# Patient Record
Sex: Female | Born: 1970 | Race: White | Hispanic: No | Marital: Single | Smoking: Never smoker
Health system: Southern US, Community
[De-identification: ages and names within clinical notes are randomized; demographics above are authoritative.]

## PROBLEM LIST (undated history)

## (undated) DIAGNOSIS — M549 Dorsalgia, unspecified: Secondary | ICD-10-CM

## (undated) DIAGNOSIS — T7840XA Allergy, unspecified, initial encounter: Secondary | ICD-10-CM

## (undated) DIAGNOSIS — I10 Essential (primary) hypertension: Secondary | ICD-10-CM

## (undated) DIAGNOSIS — E079 Disorder of thyroid, unspecified: Secondary | ICD-10-CM

## (undated) DIAGNOSIS — N809 Endometriosis, unspecified: Secondary | ICD-10-CM

## (undated) DIAGNOSIS — M2550 Pain in unspecified joint: Secondary | ICD-10-CM

## (undated) DIAGNOSIS — E669 Obesity, unspecified: Secondary | ICD-10-CM

## (undated) DIAGNOSIS — M1990 Unspecified osteoarthritis, unspecified site: Secondary | ICD-10-CM

## (undated) DIAGNOSIS — J309 Allergic rhinitis, unspecified: Secondary | ICD-10-CM

## (undated) HISTORY — PX: NASAL SINUS SURGERY: SHX719

## (undated) HISTORY — DX: Endometriosis, unspecified: N80.9

## (undated) HISTORY — DX: Disorder of thyroid, unspecified: E07.9

## (undated) HISTORY — DX: Dorsalgia, unspecified: M54.9

## (undated) HISTORY — DX: Unspecified osteoarthritis, unspecified site: M19.90

## (undated) HISTORY — DX: Obesity, unspecified: E66.9

## (undated) HISTORY — DX: Allergic rhinitis, unspecified: J30.9

## (undated) HISTORY — PX: OVARIAN CYST SURGERY: SHX726

## (undated) HISTORY — DX: Pain in unspecified joint: M25.50

## (undated) HISTORY — PX: APPENDECTOMY: SHX54

## (undated) HISTORY — DX: Essential (primary) hypertension: I10

## (undated) HISTORY — DX: Allergy, unspecified, initial encounter: T78.40XA

---

## 1992-01-15 HISTORY — PX: TONSILLECTOMY: SUR1361

## 2009-10-16 ENCOUNTER — Ambulatory Visit: Payer: Self-pay | Admitting: Obstetrics & Gynecology

## 2010-12-20 ENCOUNTER — Ambulatory Visit: Payer: Self-pay | Admitting: Obstetrics & Gynecology

## 2011-10-31 ENCOUNTER — Encounter: Payer: Self-pay | Admitting: Family Medicine

## 2011-10-31 ENCOUNTER — Ambulatory Visit (INDEPENDENT_AMBULATORY_CARE_PROVIDER_SITE_OTHER): Payer: Managed Care, Other (non HMO) | Admitting: Family Medicine

## 2011-10-31 VITALS — BP 164/110 | HR 72 | Temp 98.4°F | Ht 66.75 in | Wt 228.0 lb

## 2011-10-31 DIAGNOSIS — T7840XA Allergy, unspecified, initial encounter: Secondary | ICD-10-CM

## 2011-10-31 DIAGNOSIS — D239 Other benign neoplasm of skin, unspecified: Secondary | ICD-10-CM

## 2011-10-31 DIAGNOSIS — Z136 Encounter for screening for cardiovascular disorders: Secondary | ICD-10-CM

## 2011-10-31 DIAGNOSIS — I10 Essential (primary) hypertension: Secondary | ICD-10-CM

## 2011-10-31 DIAGNOSIS — IMO0001 Reserved for inherently not codable concepts without codable children: Secondary | ICD-10-CM

## 2011-10-31 DIAGNOSIS — D229 Melanocytic nevi, unspecified: Secondary | ICD-10-CM

## 2011-10-31 DIAGNOSIS — R252 Cramp and spasm: Secondary | ICD-10-CM

## 2011-10-31 NOTE — Progress Notes (Signed)
Subjective:    Patient ID: Ruth Elliott, female    DOB: 07-21-1970, 41 y.o.   MRN: [id number]  HPI Very pleasant 41 yo female here to establish care.  Seasonal allergies- appear well controlled on Flonase and Zyrtec. No wheezing or SOB.  Bilateral leg cramps- several weeks ago, noticed intermittent lower leg cramps, have since improved. She was exercising more at the time. Denies any new medications, she does not take diet pills or diuretics.  Endometriosis- followed by Rocco Pauls. On OCPs.  Patient Active Problem List  Diagnosis  . Allergy  . HTN, white coat   Past Medical History  Diagnosis Date  . Allergy   . Endometriosis     Followed by Rocco Pauls- on Nortrel   Past Surgical History  Procedure Date  . Appendectomy   . Tonsillectomy 1994   History  Substance Use Topics  . Smoking status: Never Smoker   . Smokeless tobacco: Not on file  . Alcohol Use: Not on file   Family History  Problem Relation Age of Onset  . COPD Mother   . Hypertension Mother   . Alzheimer's disease Father   . Hypertension Father    Allergies  Allergen Reactions  . Penicillins Shortness Of Breath   Current Outpatient Prescriptions on File Prior to Visit  Medication Sig Dispense Refill  . cetirizine (ZYRTEC) 10 MG tablet Take 10 mg by mouth daily.      . fluticasone (FLONASE) 50 MCG/ACT nasal spray Place 2 sprays into the nose daily.      . norethindrone-ethinyl estradiol 1/35 (ORTHO-NOVUM, NORTREL,CYCLAFEM) tablet Take 1 tablet by mouth daily.       The PMH, PSH, Social History, Family History, Medications, and allergies have been reviewed in [facility], and have been updated if relevant.    Review of Systems See HPI Patient reports no  vision/ hearing changes,anorexia, weight change, fever ,adenopathy, persistant / recurrent hoarseness, swallowing issues, chest pain, edema,persistant / recurrent cough, hemoptysis, dyspnea(rest, exertional, paroxysmal nocturnal),  gastrointestinal  bleeding (melena, rectal bleeding), abdominal pain, excessive heart burn, GU symptoms(dysuria, hematuria, pyuria, voiding/incontinence  Issues) syncope, focal weakness, severe memory loss, concerning skin lesions, depression, anxiety, abnormal bruising/bleeding, major joint swelling, breast masses or abnormal vaginal bleeding.       Objective:   Physical Exam Blood pressure 164/110, pulse 72, temperature 98.4 F (36.9 C), height 5' 6.75" (1.695 m), weight 228 lb (103.42 kg).  General:  Obese, Well-developed,well-nourished,in no acute distress; alert,appropriate and cooperative throughout examination Head:  normocephalic and atraumatic.   Eyes:  vision grossly intact, pupils equal, pupils round, and pupils reactive to light.   Ears:  R ear normal and L ear normal.   Nose:  no external deformity.   Breasts:  No mass, nodules, thickening, tenderness, bulging, retraction, inflamation, nipple discharge or skin changes noted.   Lungs:  Normal respiratory effort, chest expands symmetrically. Lungs are clear to auscultation, no crackles or wheezes. Heart:  Normal rate and regular rhythm. S1 and S2 normal without gallop, murmur, click, rub or other extra sounds. Abdomen:  Bowel sounds positive,abdomen soft and non-tender without masses, organomegaly or hernias noted. Msk:  No deformity or scoliosis noted of thoracic or lumbar spine.   Extremities:  No clubbing, cyanosis, edema, or deformity noted with normal full range of motion of all joints.   Neurologic:  alert & oriented X3 and gait normal.   Skin:  +AK on left forarm, several nevi Psych:  Cognition and judgment appear intact. Alert and cooperative  with normal attention span and concentration. No apparent delusions, illusions, hallucinations     Assessment & Plan:   1. Leg cramps  New- likely due to overuse- restarting activities, particularly walking on steep incline on treadmill. Will check labs to rule out other possible  factors. TSH, Magnesium, Comprehensive metabolic panel [[facility]]  2. Screening for ischemic heart disease  Lipid Panel  3. Atypical nevus  Refer to derm. Ambulatory referral to Dermatology  4. HTN, white coat  Asymptomatic- per pt, when she checks it usually 120s/80s.   5. Allergy  Stable on current meds.

## 2011-10-31 NOTE — Patient Instructions (Signed)
Great to meet you. Please stop by to see Ruth Elliott on your way out after you go to the lab. 

## 2011-11-01 LAB — COMPREHENSIVE METABOLIC PANEL
ALT: 14 IU/L (ref 0–32)
Alkaline Phosphatase: 56 IU/L (ref 42–107)
CO2: 21 mmol/L (ref 19–28)
Chloride: 105 mmol/L (ref 97–108)
GFR calc Af Amer: 126 mL/min/{1.73_m2} (ref >59)
Glucose: 83 mg/dL (ref 65–99)
Potassium: 4.4 mmol/L (ref 3.5–5.2)
Total Bilirubin: 0.3 mg/dL (ref 0.0–1.2)
Total Protein: 6.7 g/dL (ref 6.0–8.5)

## 2011-11-01 LAB — LIPID PANEL
Chol/HDL Ratio: 3.1 ratio units (ref 0.0–4.4)
HDL: 36 mg/dL — ABNORMAL LOW (ref >39)
LDL Calculated: 57 mg/dL (ref 0–99)
VLDL Cholesterol Cal: 17 mg/dL (ref 5–40)

## 2011-11-01 LAB — MAGNESIUM: Magnesium: 2 mg/dL (ref 1.6–2.6)

## 2012-02-24 ENCOUNTER — Ambulatory Visit: Payer: Self-pay

## 2012-02-29 ENCOUNTER — Other Ambulatory Visit: Payer: Self-pay

## 2012-03-18 ENCOUNTER — Encounter: Payer: Self-pay | Admitting: Family Medicine

## 2012-03-18 ENCOUNTER — Ambulatory Visit (INDEPENDENT_AMBULATORY_CARE_PROVIDER_SITE_OTHER): Payer: Managed Care, Other (non HMO) | Admitting: Family Medicine

## 2012-03-18 VITALS — BP 120/74 | HR 70 | Temp 97.6°F | Ht 66.75 in | Wt 238.8 lb

## 2012-03-18 NOTE — Progress Notes (Signed)
Nature conservation officer at [facility] [address] [place] [address] Phone: [phone] Fax: [phone]  Date:  03/18/2012   Name:  Ruth Elliott   DOB:  23-Jan-1970   MRN:  [id number] Gender: female Age: 42 y.o.  Primary Physician:  Ruthe Mannan, MD  Evaluating MD: Hannah Beat, MD   Chief Complaint: Jaw Pain   History of Present Illness:  Ruth Elliott is a 42 y.o. pleasant patient who presents with the following:  Dog caught underneath jaw and having face and ear pain. [place] Conservation officer, [employer].   Now she is having pain across her jaw and in the TMJ. No dizziness, HA, blurred vision, nausea.  Patient Active Problem List  Diagnosis  . Allergy  . HTN, white coat    Past Medical History  Diagnosis Date  . Allergy   . Endometriosis     Followed by Rocco Pauls- on Nortrel    Past Surgical History  Procedure Laterality Date  . Appendectomy    . Tonsillectomy  1994    History   Social History  . Marital Status: Single    Spouse Name: N/A    Number of Children: N/A  . Years of Education: N/A   Occupational History  . Not on file.   Social History Main Topics  . Smoking status: Never Smoker   . Smokeless tobacco: Not on file  . Alcohol Use: Not on file  . Drug Use: Not on file  . Sexually Active: Not on file   Other Topics Concern  . Not on file   Social History Narrative   Works at [employer].  Recently moved here from [place].   G0    Family History  Problem Relation Age of Onset  . COPD Mother   . Hypertension Mother   . Alzheimer's disease Father   . Hypertension Father     Allergies  Allergen Reactions  . Penicillins Shortness Of Breath    Medication list has been reviewed and updated.  Outpatient Prescriptions Prior to Visit  Medication Sig Dispense Refill  . B Complex Vitamins (B COMPLEX PO) Take by mouth.      . cetirizine (ZYRTEC) 10 MG tablet Take 10 mg by mouth daily.      . Flax Oil-Fish Oil-Borage  Oil (FISH OIL-FLAX OIL-BORAGE OIL) CAPS Take by mouth.      . fluticasone (FLONASE) 50 MCG/ACT nasal spray Place 2 sprays into the nose daily.      . Multiple Vitamin (MULTIVITAMIN) tablet Take 1 tablet by mouth daily.      . norethindrone-ethinyl estradiol 1/35 (ORTHO-NOVUM, NORTREL,CYCLAFEM) tablet Take 1 tablet by mouth daily.      . Omega 3-6-9 Fatty Acids (OMEGA 3-6-9 COMPLEX PO) Take by mouth.       No facility-administered medications prior to visit.    Review of Systems:   GEN: No acute illnesses, no fevers, chills. GI: No n/v/d, eating normally Pulm: No SOB Interactive and getting along well at home.  Otherwise, ROS is as per the HPI.   Physical Examination: BP 120/74  Pulse 70  Temp(Src) 97.6 F (36.4 C) (Oral)  Ht 5' 6.75" (1.695 m)  Wt 238 lb 12 oz (108.296 kg)  BMI 37.69 kg/m2  SpO2 98%  Ideal Body Weight: Weight in (lb) to have BMI = 25: 158.1   GEN: WDWN, NAD, Non-toxic, Alert & Oriented x 3 HEENT: Atraumatic, Normocephalic. TM clear, serous fluid, tender at TMJ Ears and Nose: No external deformity. EXTR:  No clubbing/cyanosis/edema NEURO: Normal gait.  PSYCH: Normally interactive. Conversant. Not depressed or anxious appearing.  Calm demeanor.    Assessment and Plan:  Jaw pain  Reassured, soft tissue and bone contusion with TMJ irritation likely. nsaids  Orders Today:  No orders of the defined types were placed in this encounter.    Updated Medication List: (Includes new medications, updates to list, dose adjustments) No orders of the defined types were placed in this encounter.    Medications Discontinued: There are no discontinued medications.    Signed, Elpidio Galea. Copland, MD 03/18/2012 3:42 PM

## 2012-03-19 ENCOUNTER — Ambulatory Visit: Payer: Managed Care, Other (non HMO) | Admitting: Family Medicine

## 2012-04-15 ENCOUNTER — Encounter: Payer: Self-pay | Admitting: Family Medicine

## 2012-04-15 ENCOUNTER — Ambulatory Visit (INDEPENDENT_AMBULATORY_CARE_PROVIDER_SITE_OTHER): Payer: Managed Care, Other (non HMO) | Admitting: Family Medicine

## 2012-04-15 VITALS — BP 120/72 | HR 90 | Temp 98.6°F | Ht 66.75 in | Wt 232.0 lb

## 2012-04-15 DIAGNOSIS — IMO0002 Reserved for concepts with insufficient information to code with codable children: Secondary | ICD-10-CM

## 2012-04-15 DIAGNOSIS — S76112A Strain of left quadriceps muscle, fascia and tendon, initial encounter: Secondary | ICD-10-CM

## 2012-04-15 NOTE — Progress Notes (Signed)
Nature conservation officer at [facility] [address] [place] [address] Phone: [phone] Fax: [phone]  Date:  04/15/2012   Name:  Ruth Elliott   DOB:  02-May-1970   MRN:  [id number] Gender: female Age: 42 y.o.  Primary Physician:  Ruthe Mannan, MD  Evaluating MD: Hannah Beat, MD   Chief Complaint: pulled muscle   History of Present Illness:  Ruth Elliott is a 42 y.o. pleasant patient who presents with the following:  08/2011, strained her quad, and slipped in the ice and snow and feb, and this weekend, was bending and dog pulled sideways.  Does hunter-jumper.   Left quad. She describes several instances where she injured her LEFT quadricep laterally. This initially was done in August of 2013, but she has had a couple of other instances where she has reinjured this. There's been no muscular defect and no bruising.   Patient Active Problem List  Diagnosis  . Allergy  . HTN, white coat    Past Medical History  Diagnosis Date  . Allergy   . Endometriosis     Followed by Rocco Pauls- on Nortrel    Past Surgical History  Procedure Laterality Date  . Appendectomy    . Tonsillectomy  1994    History   Social History  . Marital Status: Single    Spouse Name: N/A    Number of Children: N/A  . Years of Education: N/A   Occupational History  . Not on file.   Social History Main Topics  . Smoking status: Never Smoker   . Smokeless tobacco: Not on file  . Alcohol Use: Not on file  . Drug Use: Not on file  . Sexually Active: Not on file   Other Topics Concern  . Not on file   Social History Narrative   Works at [employer].  Recently moved here from [place].   G0    Family History  Problem Relation Age of Onset  . COPD Mother   . Hypertension Mother   . Alzheimer's disease Father   . Hypertension Father     Allergies  Allergen Reactions  . Penicillins Shortness Of Breath    Medication list has been reviewed and  updated.  Outpatient Prescriptions Prior to Visit  Medication Sig Dispense Refill  . B Complex Vitamins (B COMPLEX PO) Take by mouth.      . cetirizine (ZYRTEC) 10 MG tablet Take 10 mg by mouth daily.      . Flax Oil-Fish Oil-Borage Oil (FISH OIL-FLAX OIL-BORAGE OIL) CAPS Take by mouth.      . fluticasone (FLONASE) 50 MCG/ACT nasal spray Place 2 sprays into the nose daily.      . Multiple Vitamin (MULTIVITAMIN) tablet Take 1 tablet by mouth daily.      . norethindrone-ethinyl estradiol 1/35 (ORTHO-NOVUM, NORTREL,CYCLAFEM) tablet Take 1 tablet by mouth daily.      . Omega 3-6-9 Fatty Acids (OMEGA 3-6-9 COMPLEX PO) Take by mouth.       No facility-administered medications prior to visit.    Review of Systems:   GEN: No fevers, chills. Nontoxic. Primarily MSK c/o today. MSK: Detailed in the HPI GI: tolerating PO intake without difficulty Neuro: No numbness, parasthesias, or tingling associated. Otherwise the pertinent positives of the ROS are noted above.    Physical Examination: BP 120/72  Pulse 90  Temp(Src) 98.6 F (37 C) (Oral)  Ht 5' 6.75" (1.695 m)  Wt 232 lb (105.235 kg)  BMI 36.63 kg/m2  SpO2 98%  Ideal Body Weight: Weight in (lb) to have BMI = 25: 158.1   GEN: WDWN, NAD, Non-toxic, Alert & Oriented x 3 HEENT: Atraumatic, Normocephalic.  Ears and Nose: No external deformity. EXTR: No clubbing/cyanosis/edema NEURO: Normal gait.  PSYCH: Normally interactive. Conversant. Not depressed or anxious appearing.  Calm demeanor.   LEFT knee: Full range of motion. No effusion. Nontender on the medial and lateral joint lines. Nontender with loading of the patellar facet. Negative grind maneuver. Stable to varus and valgus stress. Anterior drawer and posterior drawer testing is negative. #Test is negative.  Nontender along vastus medialis. Vastus lateralis is tender to palpation midportion. No defect palpable. Strength testing at the knee in extension and flexion is  5/5.  Assessment and Plan: Quadriceps strain, left, initial encounter   Classic mechanism for quadriceps injury, consistent with vastus lateralis partial tear without significant muscle defect. Reassure the patient. Given rehabilitation from [facility]. Progression phase 1 through 3. Recommended body Helix quadricep Helix  Signed, Olvin Rohr T. Nikiya Starn, MD 04/15/2012 3:33 PM

## 2012-04-15 NOTE — Patient Instructions (Addendum)
BODYHELIX  Www.bodyhelix.com  Use website instuctions for measurement of limb to determine size.    

## 2012-11-16 ENCOUNTER — Encounter: Payer: Self-pay | Admitting: Family Medicine

## 2012-11-16 ENCOUNTER — Ambulatory Visit (INDEPENDENT_AMBULATORY_CARE_PROVIDER_SITE_OTHER): Payer: Managed Care, Other (non HMO) | Admitting: Family Medicine

## 2012-11-16 VITALS — BP 178/106 | HR 78 | Temp 98.6°F | Ht 66.75 in | Wt 226.0 lb

## 2012-11-16 DIAGNOSIS — S86119A Strain of other muscle(s) and tendon(s) of posterior muscle group at lower leg level, unspecified leg, initial encounter: Secondary | ICD-10-CM

## 2012-11-16 DIAGNOSIS — S838X9A Sprain of other specified parts of unspecified knee, initial encounter: Secondary | ICD-10-CM

## 2012-11-16 NOTE — Progress Notes (Signed)
Date:  11/16/2012   Name:  Ruth Elliott   DOB:  [birth year]   MRN:  [id number] Gender: female Age: 42 y.o.  Primary Physician:  Ruthe Mannan, MD   Chief Complaint: Ankle Pain   History of Present Illness:  Ruth Elliott is a 42 y.o. very pleasant female patient who presents with the following:  Left ankle: She describes as initially his left ankle pain, but she actually is not having any pain interactive her foot. She did have a prior quadricep partial thickness tear earlier in the summer, and now she is having pain mostly in her lateral gastrocnemius. She is having some pain with movement and with walking.  Lateral gastroc  Past Medical History, Surgical History, Social History, Family History, Problem List, Medications, and Allergies have been reviewed and updated if relevant.  Review of Systems:  GEN: No fevers, chills. Nontoxic. Primarily MSK c/o today. MSK: Detailed in the HPI GI: tolerating PO intake without difficulty Neuro: No numbness, parasthesias, or tingling associated. Otherwise the pertinent positives of the ROS are noted above.    Physical Examination: BP 178/106  Pulse 78  Temp(Src) 98.6 F (37 C) (Oral)  Ht 5' 6.75" (1.695 m)  Wt 226 lb (102.513 kg)  BMI 35.68 kg/m2  LMP 10/26/2012   GEN: Well-developed,well-nourished,in no acute distress; alert,appropriate and cooperative throughout examination HEENT: Normocephalic and atraumatic without obvious abnormalities. Ears, externally no deformities PULM: Breathing comfortably in no respiratory distress EXT: No clubbing, cyanosis, or edema PSYCH: Normally interactive. Cooperative during the interview. Pleasant. Friendly and conversant. Not anxious or depressed appearing. Normal, full affect.  ANKLE: B Echymosis: no Edema: no ROM: Full dorsi and plantar flexion, inversion, eversion Gait: heel toe, non-antalgic Lateral Mall: NT Medial Mall: NT Talus: NT Navicular: NT Cuboid: NT Calcaneous:  NT Metatarsals: NT 5th MT: NT Phalanges: NT Achilles: NT Plantar Fascia: NT Fat Pad: NT Peroneals: NT Post Tib: NT Great Toe: Nml motion Ant Drawer: neg Talar Tilt: neg ATFL: NT CFL: NT Deltoid: NT Str: 5/5 Other Special tests: none Sensation: intact   Lateral gastrocnemius is mildly tender to palpation and tender to palpation along the tendon in the posterior insertion.  Assessment and Plan:  Gastrocnemius muscle strain, unspecified laterality, initial encounter  Rehab through injury  Patient Instructions  Treadmill: Calf and posterior lower extremity rehab  Forward Walking: Go light 2 mins, easy about 2-3 mph: At Sideways Left: 2 mins, 0.6 - 0.8 mph Sideways Right: 2 mins, 0.6 - 0.8 mph Backwards, 2 mins, 1.8 - 2.2 mph Repeat, several cycles Goal is 30 minute  Start at a 3 degree incline - can slightly lower if necessary When able, increase to 3.5, then 4, then 4.5 (After you comfortably can do 30 minutes)    Orders Today:  No orders of the defined types were placed in this encounter.    New medications, updates to list, dose adjustments: No orders of the defined types were placed in this encounter.    Signed,  Elpidio Galea. Wenona Mayville, MD, CAQ Sports Medicine  [facility] [address] [place] [address] Phone: [phone] Fax: [phone]  Updated Complete Medication List:   Medication List       This list is accurate as of: 11/16/12  3:28 PM.  Always use your most recent med list.               B COMPLEX PO  Take by mouth.     cetirizine 10 MG tablet  Commonly known as:  ZYRTEC  Take 10 mg by mouth daily.     Fish Oil-Flax Oil-Borage Oil Caps  Take by mouth.     fluticasone 50 MCG/ACT nasal spray  Commonly known as:  FLONASE  Place 2 sprays into the nose daily.     multivitamin tablet  Take 1 tablet by mouth daily.     norethindrone-ethinyl estradiol 1/35 tablet  Commonly known as:  ORTHO-NOVUM,  NORTREL,CYCLAFEM  Take 1 tablet by mouth daily.

## 2012-11-16 NOTE — Patient Instructions (Signed)
Treadmill: Calf and posterior lower extremity rehab  Forward Walking: Go light 2 mins, easy about 2-3 mph: At Sideways Left: 2 mins, 0.6 - 0.8 mph Sideways Right: 2 mins, 0.6 - 0.8 mph Backwards, 2 mins, 1.8 - 2.2 mph Repeat, several cycles Goal is 30 minute  Start at a 3 degree incline - can slightly lower if necessary When able, increase to 3.5, then 4, then 4.5 (After you comfortably can do 30 minutes)  

## 2012-11-19 ENCOUNTER — Other Ambulatory Visit: Payer: Self-pay

## 2013-03-15 ENCOUNTER — Ambulatory Visit: Payer: Self-pay | Admitting: Obstetrics & Gynecology

## 2013-08-31 ENCOUNTER — Encounter: Payer: Self-pay | Admitting: Family Medicine

## 2013-08-31 ENCOUNTER — Ambulatory Visit (INDEPENDENT_AMBULATORY_CARE_PROVIDER_SITE_OTHER): Payer: Managed Care, Other (non HMO) | Admitting: Family Medicine

## 2013-08-31 VITALS — BP 128/78 | HR 69 | Temp 98.1°F | Ht 65.5 in | Wt 209.5 lb

## 2013-08-31 DIAGNOSIS — E66812 Obesity, class 2: Secondary | ICD-10-CM | POA: Insufficient documentation

## 2013-08-31 DIAGNOSIS — E6609 Other obesity due to excess calories: Secondary | ICD-10-CM | POA: Insufficient documentation

## 2013-08-31 DIAGNOSIS — E669 Obesity, unspecified: Secondary | ICD-10-CM | POA: Insufficient documentation

## 2013-08-31 NOTE — Addendum Note (Signed)
Addended by: Marchia Bond on: 08/31/2013 10:57 AM   Modules accepted: Orders

## 2013-08-31 NOTE — Assessment & Plan Note (Signed)
Improved but she feels she cannot lose more weight or improve lifestyle. BMI 34.32. Will check labs today to rule out thyroid dysfunction and insulin resistance. The patient indicates understanding of these issues and agrees with the plan.  She declines nutrition referral at this time.

## 2013-08-31 NOTE — Progress Notes (Signed)
Subjective:   Patient ID: Ruth Elliott, female    DOB: [birth year], 43 y.o.   MRN: [id number]  ELECTRA PALADINO is a pleasant 43 y.o. year old female who presents to clinic today with Follow-up  on 08/31/2013  HPI: I have not seen her since she established care with me in 10/2011.  Wellness program at work- has to lose 10% of her body weight.  Has been exercising intensely daily and following my fitness pal with only 20 lb weight loss. She feels she should be losing more.  No family h/o thyroid dysfunction.  She has noticed more constipation but denies any other symptoms of hypo thyroidism.  Wt Readings from Last 3 Encounters:  08/31/13 209 lb 8 oz (95.029 kg)  11/16/12 226 lb (102.513 kg)  04/15/12 232 lb (105.235 kg)   Current Outpatient Prescriptions on File Prior to Visit  Medication Sig Dispense Refill  . B Complex Vitamins (B COMPLEX PO) Take by mouth.      . cetirizine (ZYRTEC) 10 MG tablet Take 10 mg by mouth daily.      . Flax Oil-Fish Oil-Borage Oil (FISH OIL-FLAX OIL-BORAGE OIL) CAPS Take by mouth.      . fluticasone (FLONASE) 50 MCG/ACT nasal spray Place 2 sprays into the nose daily.      . Multiple Vitamin (MULTIVITAMIN) tablet Take 1 tablet by mouth daily.      . norethindrone-ethinyl estradiol 1/35 (ORTHO-NOVUM, NORTREL,CYCLAFEM) tablet Take 1 tablet by mouth daily.       No current facility-administered medications on file prior to visit.    Allergies  Allergen Reactions  . Penicillins Shortness Of Breath    Past Medical History  Diagnosis Date  . Allergy   . Endometriosis     Followed by Lynnell Chad- on Nortrel    Past Surgical History  Procedure Laterality Date  . Appendectomy    . Tonsillectomy  1994    Family History  Problem Relation Age of Onset  . COPD Mother   . Hypertension Mother   . Alzheimer's disease Father   . Hypertension Father     History   Social History  . Marital Status: Single    Spouse Name: N/A    Number  of Children: N/A  . Years of Education: N/A   Occupational History  . Not on file.   Social History Main Topics  . Smoking status: Never Smoker   . Smokeless tobacco: Never Used  . Alcohol Use: No  . Drug Use: No  . Sexual Activity: Not on file   Other Topics Concern  . Not on file   Social History Narrative   Works at [employer].  Recently moved here from [place].   G0   The PMH, PSH, Social History, Family History, Medications, and allergies have been reviewed in [facility], and have been updated if relevant.   Review of Systems See HPI Denies increased thirst or urination No cold or heat intolerance No depression or anxiety No difficulty swallowing No tremors No palpitations    Objective:    BP 128/78  Pulse 69  Temp(Src) 98.1 F (36.7 C) (Oral)  Ht 5' 5.5" (1.664 m)  Wt 209 lb 8 oz (95.029 kg)  BMI 34.32 kg/m2  SpO2 99%  LMP 07/27/2013   Physical Exam  Nursing note and vitals reviewed. Constitutional: She is oriented to person, place, and time. She appears well-developed and well-nourished. No distress.  HENT:  Head: Normocephalic and atraumatic.  No thyromegaly  Eyes: Pupils are equal, round, and reactive to light.  Cardiovascular: Normal rate and regular rhythm.   Pulmonary/Chest: Effort normal and breath sounds normal. No respiratory distress.  Abdominal: Soft. Bowel sounds are normal.  Neurological: She is alert and oriented to person, place, and time.  Skin: Skin is warm and dry.  Psychiatric: She has a normal mood and affect. Her behavior is normal. Judgment and thought content normal.          Assessment & Plan:   Obesity, unspecified - Plan: TSH, T4, Free, Hemoglobin A1c No Follow-up on file.

## 2013-08-31 NOTE — Progress Notes (Signed)
Pre visit review using our clinic review tool, if applicable. No additional management support is needed unless otherwise documented below in the visit note. 

## 2013-08-31 NOTE — Patient Instructions (Signed)
Great to see you. I will call you with your lab results.   

## 2013-09-01 LAB — HEMOGLOBIN A1C
ESTIMATED AVERAGE GLUCOSE: 108 mg/dL
Hgb A1c MFr Bld: 5.4 % (ref 4.8–5.6)

## 2013-09-01 LAB — TSH: TSH: 1.08 u[IU]/mL (ref 0.450–4.500)

## 2013-09-01 LAB — T4, FREE: Free T4: 1.28 ng/dL (ref 0.82–1.77)

## 2013-09-02 ENCOUNTER — Encounter: Payer: Self-pay | Admitting: Family Medicine

## 2013-09-06 ENCOUNTER — Telehealth: Payer: Self-pay | Admitting: Emergency Medicine

## 2013-09-06 NOTE — Telephone Encounter (Signed)
Placed in Dr Hulen Shouts inbox for review and completion

## 2013-09-06 NOTE — Telephone Encounter (Signed)
Spoke to pt and informed her form is available for pickup at the front desk

## 2013-09-06 NOTE — Telephone Encounter (Signed)
Patient dropped off Health Screening form to be filled out by MD. Left on Waynetta's desk./as

## 2014-02-22 ENCOUNTER — Ambulatory Visit (INDEPENDENT_AMBULATORY_CARE_PROVIDER_SITE_OTHER): Payer: Managed Care, Other (non HMO) | Admitting: Internal Medicine

## 2014-02-22 ENCOUNTER — Encounter: Payer: Self-pay | Admitting: Internal Medicine

## 2014-02-22 VITALS — BP 160/98 | HR 76 | Temp 98.8°F | Wt 216.0 lb

## 2014-02-22 DIAGNOSIS — T148XXA Other injury of unspecified body region, initial encounter: Secondary | ICD-10-CM

## 2014-02-22 DIAGNOSIS — T148 Other injury of unspecified body region: Secondary | ICD-10-CM

## 2014-02-22 MED ORDER — CYCLOBENZAPRINE HCL 5 MG PO TABS: 5.0000 mg | ORAL_TABLET | Freq: Three times a day (TID) | ORAL | Status: DC | PRN

## 2014-02-22 NOTE — Progress Notes (Signed)
Pre visit review using our clinic review tool, if applicable. No additional management support is needed unless otherwise documented below in the visit note. 

## 2014-02-22 NOTE — Patient Instructions (Signed)
Back Exercises These exercises may help you when beginning to rehabilitate your injury. Your symptoms may resolve with or without further involvement from your physician, physical therapist or athletic trainer. While completing these exercises, remember:   Restoring tissue flexibility helps normal motion to return to the joints. This allows healthier, less painful movement and activity.  An effective stretch should be held for at least 30 seconds.  A stretch should never be painful. You should only feel a gentle lengthening or release in the stretched tissue. STRETCH - Extension, Prone on Elbows   Lie on your stomach on the floor, a bed will be too soft. Place your palms about shoulder width apart and at the height of your head.  Place your elbows under your shoulders. If this is too painful, stack pillows under your chest.  Allow your body to relax so that your hips drop lower and make contact more completely with the floor.  Hold this position for __________ seconds.  Slowly return to lying flat on the floor. Repeat __________ times. Complete this exercise __________ times per day.  RANGE OF MOTION - Extension, Prone Press Ups   Lie on your stomach on the floor, a bed will be too soft. Place your palms about shoulder width apart and at the height of your head.  Keeping your back as relaxed as possible, slowly straighten your elbows while keeping your hips on the floor. You may adjust the placement of your hands to maximize your comfort. As you gain motion, your hands will come more underneath your shoulders.  Hold this position __________ seconds.  Slowly return to lying flat on the floor. Repeat __________ times. Complete this exercise __________ times per day.  RANGE OF MOTION- Quadruped, Neutral Spine   Assume a hands and knees position on a firm surface. Keep your hands under your shoulders and your knees under your hips. You may place padding under your knees for  comfort.  Drop your head and point your tail bone toward the ground below you. This will round out your low back like an angry cat. Hold this position for __________ seconds.  Slowly lift your head and release your tail bone so that your back sags into a large arch, like an old horse.  Hold this position for __________ seconds.  Repeat this until you feel limber in your low back.  Now, find your "sweet spot." This will be the most comfortable position somewhere between the two previous positions. This is your neutral spine. Once you have found this position, tense your stomach muscles to support your low back.  Hold this position for __________ seconds. Repeat __________ times. Complete this exercise __________ times per day.  STRETCH - Flexion, Single Knee to Chest   Lie on a firm bed or floor with both legs extended in front of you.  Keeping one leg in contact with the floor, bring your opposite knee to your chest. Hold your leg in place by either grabbing behind your thigh or at your knee.  Pull until you feel a gentle stretch in your low back. Hold __________ seconds.  Slowly release your grasp and repeat the exercise with the opposite side. Repeat __________ times. Complete this exercise __________ times per day.  STRETCH - Hamstrings, Standing  Stand or sit and extend your right / left leg, placing your foot on a chair or foot stool  Keeping a slight arch in your low back and your hips straight forward.  Lead with your chest and   lean forward at the waist until you feel a gentle stretch in the back of your right / left knee or thigh. (When done correctly, this exercise requires leaning only a small distance.)  Hold this position for __________ seconds. Repeat __________ times. Complete this stretch __________ times per day. STRENGTHENING - Deep Abdominals, Pelvic Tilt   Lie on a firm bed or floor. Keeping your legs in front of you, bend your knees so they are both pointed  toward the ceiling and your feet are flat on the floor.  Tense your lower abdominal muscles to press your low back into the floor. This motion will rotate your pelvis so that your tail bone is scooping upwards rather than pointing at your feet or into the floor.  With a gentle tension and even breathing, hold this position for __________ seconds. Repeat __________ times. Complete this exercise __________ times per day.  STRENGTHENING - Abdominals, Crunches   Lie on a firm bed or floor. Keeping your legs in front of you, bend your knees so they are both pointed toward the ceiling and your feet are flat on the floor. Cross your arms over your chest.  Slightly tip your chin down without bending your neck.  Tense your abdominals and slowly lift your trunk high enough to just clear your shoulder blades. Lifting higher can put excessive stress on the low back and does not further strengthen your abdominal muscles.  Control your return to the starting position. Repeat __________ times. Complete this exercise __________ times per day.  STRENGTHENING - Quadruped, Opposite UE/LE Lift   Assume a hands and knees position on a firm surface. Keep your hands under your shoulders and your knees under your hips. You may place padding under your knees for comfort.  Find your neutral spine and gently tense your abdominal muscles so that you can maintain this position. Your shoulders and hips should form a rectangle that is parallel with the floor and is not twisted.  Keeping your trunk steady, lift your right hand no higher than your shoulder and then your left leg no higher than your hip. Make sure you are not holding your breath. Hold this position __________ seconds.  Continuing to keep your abdominal muscles tense and your back steady, slowly return to your starting position. Repeat with the opposite arm and leg. Repeat __________ times. Complete this exercise __________ times per day. Document Released:  01/18/2005 Document Revised: 03/25/2011 Document Reviewed: 04/14/2008 Exit[employer] Patient Information 2015 [employer]. This information is not intended to replace advice given to you by your health care provider. Make sure you discuss any questions you have with your health care provider.  

## 2014-02-22 NOTE — Progress Notes (Signed)
Subjective:    Patient ID: Ruth Elliott, female    DOB: 05/12/1970, 44 y.o.   MRN: [id number]  HPI  Ruth Elliott is a 44 y.o. female presenting for c/o sudden muscle pain in the left mid-back region. She started feeling the pain yesterday morning while driving. Pain feels like a twisting sensation that won't let up. Pain is 8/10. Tried ibuprofen 600 mg, tylenol, ice and heat with minimal relief. She sits in the same position at work and thinks it might be the cause; has some relief when standing and stretching. No changes in bowel/bladder, numbness/tingling or shooting pains. No hx of injuries. Goes to chiropractor once a month; last visit 02/09/13. BP 160/98; she attributes to "white coat syndrome".  Review of Systems   Past Medical History  Diagnosis Date  . Allergy   . Endometriosis     Followed by [facility]- on Nortrel    Current Outpatient Prescriptions  Medication Sig Dispense Refill  . B Complex Vitamins (B COMPLEX PO) Take by mouth.    . cetirizine-pseudoephedrine (ZYRTEC-D) 5-120 MG per tablet Take 1 tablet by mouth as needed for allergies.    . Flax Oil-Fish Oil-Borage Oil (FISH OIL-FLAX OIL-BORAGE OIL) CAPS Take by mouth.    . fluticasone (FLONASE) 50 MCG/ACT nasal spray Place 2 sprays into the nose daily.    . Multiple Vitamin (MULTIVITAMIN) tablet Take 1 tablet by mouth daily.    . norethindrone-ethinyl estradiol 1/35 (ORTHO-NOVUM, NORTREL,CYCLAFEM) tablet Take 1 tablet by mouth daily.    . cetirizine (ZYRTEC) 10 MG tablet Take 10 mg by mouth daily.     No current facility-administered medications for this visit.    Allergies  Allergen Reactions  . Penicillins Shortness Of Breath    Family History  Problem Relation Age of Onset  . COPD Mother   . Hypertension Mother   . Alzheimer's disease Father   . Hypertension Father     History   Social History  . Marital Status: Single    Spouse Name: N/A    Number of Children: N/A  . Years of Education:  N/A   Occupational History  . Not on file.   Social History Main Topics  . Smoking status: Never Smoker   . Smokeless tobacco: Never Used  . Alcohol Use: No  . Drug Use: Yes  . Sexual Activity: Not on file   Other Topics Concern  . Not on file   Social History Narrative   Works at [employer].  Recently moved here from [place].   G0   Constitutional: Denies fever, chills or unintentional weight loss. Respiratory: Denies difficulty breathing, shortness of breath or cough. Cardiovascular: Denies chest pain, chest tightness, palpitations or swelling in the hands or feet.  Gastrointestinal: Denies abdominal pain, bloating, constipation, diarrhea or blood in the stool.  GU: Denies urgency, frequency, pain with urination, burning sensation, blood in urine, odor or discharge. Musculoskeletal: Positive back muscle pain. Denies decrease in range of motion, difficulty with gait, joint pain or swelling.  Skin: Denies redness, rashes, lesions or ulcercations.  Neurological: Denies dizziness, paresthesias or problems with balance and coordination.   No other specific complaints in a complete review of systems (except as listed in HPI above).     Objective:   Physical Exam BP 160/98 mmHg  Pulse 76  Temp(Src) 98.8 F (37.1 C) (Oral)  Wt 216 lb (97.977 kg)  SpO2 98%   BP 160/98 mmHg  Pulse 76  Temp(Src) 98.8 F (37.1 C) (  Oral)  Wt 216 lb (97.977 kg)  SpO2 98% Wt Readings from Last 3 Encounters:  02/22/14 216 lb (97.977 kg)  08/31/13 209 lb 8 oz (95.029 kg)  11/16/12 226 lb (102.513 kg)    General: Appears her stated age, well developed, well nourished in NAD. Skin: Warm, dry and intact. No rashes, lesions or ulcerations noted. Cardiovascular: Normal rate and rhythm. S1,S2 noted.  No murmur, rubs or gallops noted.  Pulmonary/Chest: Normal effort and positive vesicular breath sounds. No respiratory distress.  Musculoskeletal: Bony features of shoulders and hips are of equal height  b/l.  Spine and lower extremities equal b/l; posture is upright; gait is smooth and normal, able to walk on toes and heels w/ no difficulty. Back - spinous processes of C7-L5 palpable, midline; normal range of motion, negative straight leg test;  Moderate tenderness to palpation of left mid-back, mild tenderness to palpation of left upper hip region. No CVAT. Left hip - normal ROM, muscle strength 5/5. Neurological: Alert and oriented. Cranial nerves II-XII intact. Coordination normal.       Component Value Date/Time   NA 143 10/31/2011 1144   K 4.4 10/31/2011 1144   CL 105 10/31/2011 1144   CO2 21 10/31/2011 1144   GLUCOSE 83 10/31/2011 1144   BUN 12 10/31/2011 1144   CREATININE 0.66 10/31/2011 1144   CALCIUM 9.1 10/31/2011 1144   GFRNONAA 109 10/31/2011 1144   GFRAA 126 10/31/2011 1144    Lipid Panel     Component Value Date/Time   TRIG 86 10/31/2011 1144   HDL 36* 10/31/2011 1144   CHOLHDL 3.1 10/31/2011 1144   LDLCALC 57 10/31/2011 1144    CBC No results found for: WBC, RBC, HGB, HCT, PLT, MCV, MCH, MCHC, RDW, LYMPHSABS, MONOABS, EOSABS, BASOSABS  Hgb A1C Lab Results  Component Value Date   HGBA1C 5.4 08/31/2013         Assessment & Plan:   Muscle strain:  Flexeril 5 MG tablet PRN  Back exercises handout Continue use of ice pack and/or heating pad  ibuprofen PRN   RTC if symptoms worsen.

## 2014-07-26 ENCOUNTER — Other Ambulatory Visit: Payer: Self-pay | Admitting: Family Medicine

## 2014-07-26 DIAGNOSIS — Z01419 Encounter for gynecological examination (general) (routine) without abnormal findings: Secondary | ICD-10-CM | POA: Insufficient documentation

## 2014-08-10 ENCOUNTER — Other Ambulatory Visit (INDEPENDENT_AMBULATORY_CARE_PROVIDER_SITE_OTHER): Payer: Managed Care, Other (non HMO)

## 2014-08-10 DIAGNOSIS — Z01419 Encounter for gynecological examination (general) (routine) without abnormal findings: Secondary | ICD-10-CM

## 2014-08-10 DIAGNOSIS — Z0000 Encounter for general adult medical examination without abnormal findings: Secondary | ICD-10-CM

## 2014-08-11 LAB — COMPREHENSIVE METABOLIC PANEL
A/G RATIO: 1.6 (ref 1.1–2.5)
ALK PHOS: 49 IU/L (ref 39–117)
ALT: 15 IU/L (ref 0–32)
AST: 31 IU/L (ref 0–40)
Albumin: 4.2 g/dL (ref 3.5–5.5)
BILIRUBIN TOTAL: 0.8 mg/dL (ref 0.0–1.2)
BUN/Creatinine Ratio: 27 — ABNORMAL HIGH (ref 9–23)
BUN: 16 mg/dL (ref 6–24)
CHLORIDE: 96 mmol/L — AB (ref 97–108)
CO2: 17 mmol/L — AB (ref 18–29)
Calcium: 8.7 mg/dL (ref 8.7–10.2)
Creatinine, Ser: 0.6 mg/dL (ref 0.57–1.00)
GFR calc Af Amer: 128 mL/min/{1.73_m2} (ref >59)
GFR calc non Af Amer: 111 mL/min/{1.73_m2} (ref >59)
Globulin, Total: 2.6 g/dL (ref 1.5–4.5)
Glucose: 89 mg/dL (ref 65–99)
POTASSIUM: 4.3 mmol/L (ref 3.5–5.2)
SODIUM: 137 mmol/L (ref 134–144)
Total Protein: 6.8 g/dL (ref 6.0–8.5)

## 2014-08-11 LAB — CBC WITH DIFFERENTIAL/PLATELET
BASOS ABS: 0 10*3/uL (ref 0.0–0.2)
Basos: 0 %
EOS (ABSOLUTE): 0.1 10*3/uL (ref 0.0–0.4)
Eos: 1 %
HEMATOCRIT: 42.3 % (ref 34.0–46.6)
Hemoglobin: 14.3 g/dL (ref 11.1–15.9)
Immature Grans (Abs): 0 10*3/uL (ref 0.0–0.1)
Immature Granulocytes: 0 %
LYMPHS ABS: 3.1 10*3/uL (ref 0.7–3.1)
LYMPHS: 30 %
MCH: 32.8 pg (ref 26.6–33.0)
MCHC: 33.8 g/dL (ref 31.5–35.7)
MCV: 97 fL (ref 79–97)
MONOS ABS: 0.4 10*3/uL (ref 0.1–0.9)
Monocytes: 4 %
Neutrophils Absolute: 6.8 10*3/uL (ref 1.4–7.0)
Neutrophils: 65 %
Platelets: 175 10*3/uL (ref 150–379)
RBC: 4.36 x10E6/uL (ref 3.77–5.28)
RDW: 13.1 % (ref 12.3–15.4)
WBC: 10.5 10*3/uL (ref 3.4–10.8)

## 2014-08-11 LAB — LIPID PANEL
CHOL/HDL RATIO: 3 ratio (ref 0.0–4.4)
Cholesterol, Total: 115 mg/dL (ref 100–199)
HDL: 38 mg/dL — ABNORMAL LOW (ref >39)
LDL Calculated: 59 mg/dL (ref 0–99)
Triglycerides: 91 mg/dL (ref 0–149)
VLDL Cholesterol Cal: 18 mg/dL (ref 5–40)

## 2014-08-11 LAB — HEMOGLOBIN A1C
ESTIMATED AVERAGE GLUCOSE: 108 mg/dL
Hgb A1c MFr Bld: 5.4 % (ref 4.8–5.6)

## 2014-08-11 LAB — TSH: TSH: 0.818 u[IU]/mL (ref 0.450–4.500)

## 2014-08-15 ENCOUNTER — Encounter: Payer: Self-pay | Admitting: Family Medicine

## 2014-08-15 ENCOUNTER — Ambulatory Visit (INDEPENDENT_AMBULATORY_CARE_PROVIDER_SITE_OTHER): Payer: Managed Care, Other (non HMO) | Admitting: Family Medicine

## 2014-08-15 VITALS — BP 144/88 | HR 78 | Temp 98.6°F | Ht 65.5 in | Wt 206.2 lb

## 2014-08-15 DIAGNOSIS — M791 Myalgia, unspecified site: Secondary | ICD-10-CM | POA: Insufficient documentation

## 2014-08-15 DIAGNOSIS — IMO0001 Reserved for inherently not codable concepts without codable children: Secondary | ICD-10-CM

## 2014-08-15 DIAGNOSIS — Z0000 Encounter for general adult medical examination without abnormal findings: Secondary | ICD-10-CM

## 2014-08-15 DIAGNOSIS — E669 Obesity, unspecified: Secondary | ICD-10-CM

## 2014-08-15 DIAGNOSIS — Z01419 Encounter for gynecological examination (general) (routine) without abnormal findings: Secondary | ICD-10-CM

## 2014-08-15 DIAGNOSIS — R5383 Other fatigue: Secondary | ICD-10-CM | POA: Diagnosis not present

## 2014-08-15 DIAGNOSIS — R030 Elevated blood-pressure reading, without diagnosis of hypertension: Secondary | ICD-10-CM

## 2014-08-15 NOTE — Assessment & Plan Note (Signed)
New- likely multifactorial. Given myalgias- rule out Vit D and B12 deficiencies.  Other routine labs and exam today reassuring. The patient indicates understanding of these issues and agrees with the plan.  Orders Placed This Encounter  Procedures  . Vit D  25 hydroxy (rtn osteoporosis monitoring)  . Vitamin B12  . Nicotine/cotinine metabolites

## 2014-08-15 NOTE — Assessment & Plan Note (Signed)
Reviewed preventive care protocols, scheduled due services, and updated immunizations Discussed nutrition, exercise, diet, and healthy lifestyle.  

## 2014-08-15 NOTE — Progress Notes (Signed)
Subjective:   Patient ID: Ruth Elliott, female    DOB: May 10, 1970, 44 y.o.   MRN: [id number]  Ruth Elliott is a pleasant 44 y.o. year old female who presents to clinic today with Annual Exam; Fatigue; and muscle aches  on 08/15/2014  HPI:  G0- has Howe. Last pap smear was 03/2014 per pt.  No h/o abnormal pap smears.  Brings in health screening form from her employer, [employer], to fill out today.  3 D mammo 03/2014 as well also neg per pt.  Fatigue- with muscle cramps for past few months.  Mainly in her legs, quadriceps.  No DOE- walks 25 per day.  Also walks on the treadmill 30 minutes 5 days per week.  Sleeping ok but not feel well rested. Periods have been irregular- bleeding almost every other week.  Plans on calling her GYN today to make an appointment to discuss. No blood in her stool. Wt Readings from Last 3 Encounters:  08/15/14 206 lb 4 oz (93.554 kg)  02/22/14 216 lb (97.977 kg)  08/31/13 209 lb 8 oz (95.029 kg)     White coat HTN- per pt, at her screen at work was 127/80.  Denies HA, blurred vision, CP or SOB. Lab Results  Component Value Date   WBC 10.5 08/10/2014   HCT 42.3 08/10/2014   Lab Results  Component Value Date   TSH 0.818 08/10/2014   Lab Results  Component Value Date   CHOL 115 08/10/2014   HDL 38* 08/10/2014   LDLCALC 59 08/10/2014   TRIG 91 08/10/2014   CHOLHDL 3.0 08/10/2014   Lab Results  Component Value Date   CREATININE 0.60 08/10/2014   Lab Results  Component Value Date   ALT 15 08/10/2014   AST 31 08/10/2014   ALKPHOS 49 08/10/2014   BILITOT 0.8 08/10/2014   Current Outpatient Prescriptions on File Prior to Visit  Medication Sig Dispense Refill  . B Complex Vitamins (B COMPLEX PO) Take by mouth.    . cetirizine (ZYRTEC) 10 MG tablet Take 10 mg by mouth daily.    . cetirizine-pseudoephedrine (ZYRTEC-D) 5-120 MG per tablet Take 1 tablet by mouth as needed for allergies.    . Flax Oil-Fish Oil-Borage Oil (FISH  OIL-FLAX OIL-BORAGE OIL) CAPS Take by mouth.    . Multiple Vitamin (MULTIVITAMIN) tablet Take 1 tablet by mouth daily.    . norethindrone-ethinyl estradiol 1/35 (ORTHO-NOVUM, NORTREL,CYCLAFEM) tablet Take 1 tablet by mouth daily.     No current facility-administered medications on file prior to visit.    Allergies  Allergen Reactions  . Penicillins Shortness Of Breath    Past Medical History  Diagnosis Date  . Allergy   . Endometriosis     Followed by Lynnell Chad- on Nortrel    Past Surgical History  Procedure Laterality Date  . Appendectomy    . Tonsillectomy  1994    Family History  Problem Relation Age of Onset  . COPD Mother   . Hypertension Mother   . Alzheimer's disease Father   . Hypertension Father     History   Social History  . Marital Status: Single    Spouse Name: N/A  . Number of Children: N/A  . Years of Education: N/A   Occupational History  . Not on file.   Social History Main Topics  . Smoking status: Never Smoker   . Smokeless tobacco: Never Used  . Alcohol Use: No  . Drug Use: Yes  .  Sexual Activity: Not on file   Other Topics Concern  . Not on file   Social History Narrative   Works at [employer].  Recently moved here from [place].   G0   The PMH, PSH, Social History, Family History, Medications, and allergies have been reviewed in [facility], and have been updated if relevant.   Review of Systems  Constitutional: Positive for fatigue. Negative for fever and unexpected weight change.  HENT: Negative.   Eyes: Negative.   Respiratory: Negative.   Cardiovascular: Negative.   Gastrointestinal: Negative.   Endocrine: Negative.   Genitourinary: Negative.   Musculoskeletal: Negative.   Skin: Negative.   Allergic/Immunologic: Negative.   Neurological: Negative.   Hematological: Negative.   Psychiatric/Behavioral: Negative.   All other systems reviewed and are negative.      Objective:    BP 144/88 mmHg  Pulse 78  Temp(Src)  98.6 F (37 C) (Oral)  Ht 5' 5.5" (1.664 m)  Wt 206 lb 4 oz (93.554 kg)  BMI 33.79 kg/m2  SpO2 98%  LMP 08/12/2014  BP Readings from Last 3 Encounters:  08/15/14 144/88  02/22/14 160/98  08/31/13 128/78    Physical Exam    General:  Well-developed,well-nourished,in no acute distress; alert,appropriate and cooperative throughout examination Head:  normocephalic and atraumatic.   Eyes:  vision grossly intact, pupils equal, pupils round, and pupils reactive to light.   Ears:  R ear normal and L ear normal.   Nose:  no external deformity.   Mouth:  good dentition.   Neck:  No deformities, masses, or tenderness noted. Lungs:  Normal respiratory effort, chest expands symmetrically. Lungs are clear to auscultation, no crackles or wheezes. Heart:  Normal rate and regular rhythm. S1 and S2 normal without gallop, murmur, click, rub or other extra sounds. Abdomen:  Bowel sounds positive,abdomen soft and non-tender without masses, organomegaly or hernias noted. Msk:  No deformity or scoliosis noted of thoracic or lumbar spine.   Extremities:  No clubbing, cyanosis, edema, or deformity noted with normal full range of motion of all joints.   Neurologic:  alert & oriented X3 and gait normal.   Skin:  Intact without suspicious lesions or rashes Cervical Nodes:  No lymphadenopathy noted Axillary Nodes:  No palpable lymphadenopathy Psych:  Cognition and judgment appear intact. Alert and cooperative with normal attention span and concentration. No apparent delusions, illusions, hallucinations      Assessment & Plan:   Well woman exam - Plan: Nicotine/cotinine metabolites  Obesity  HTN, white coat  Other fatigue  Myalgia - Plan: Vit D  25 hydroxy (rtn osteoporosis monitoring), Vitamin B12 No Follow-up on file.

## 2014-08-15 NOTE — Progress Notes (Signed)
Pre visit review using our clinic review tool, if applicable. No additional management support is needed unless otherwise documented below in the visit note. 

## 2014-08-15 NOTE — Assessment & Plan Note (Addendum)
Unchanged, remains asymptomatic.  Gets it monitored at work and remains normotensive per pt.

## 2014-08-17 LAB — VITAMIN B12: Vitamin B-12: 358 pg/mL (ref 211–946)

## 2014-08-17 LAB — NICOTINE/COTININE METABOLITES
Cotinine: NOT DETECTED ng/mL
NICOTINE: NOT DETECTED ng/mL

## 2014-08-17 LAB — VITAMIN D 25 HYDROXY (VIT D DEFICIENCY, FRACTURES): VIT D 25 HYDROXY: 42.9 ng/mL (ref 30.0–100.0)

## 2014-08-19 ENCOUNTER — Encounter: Payer: Self-pay | Admitting: *Deleted

## 2014-10-03 ENCOUNTER — Telehealth: Payer: Self-pay | Admitting: Family Medicine

## 2014-10-03 NOTE — Telephone Encounter (Signed)
Spoke to pt and informed her paperwork is available for pickup at the front desk

## 2014-10-03 NOTE — Telephone Encounter (Signed)
Pt dropped off an Appeal form for [place] form to be filled out. You can call [phone] when finished. Placing ppw on Dr. Hulen Shouts inbox.

## 2014-10-03 NOTE — Telephone Encounter (Signed)
Form filled out and placed in [facility] box for completion.

## 2015-01-11 ENCOUNTER — Ambulatory Visit (INDEPENDENT_AMBULATORY_CARE_PROVIDER_SITE_OTHER): Payer: Managed Care, Other (non HMO) | Admitting: Family Medicine

## 2015-01-11 ENCOUNTER — Encounter: Payer: Self-pay | Admitting: Family Medicine

## 2015-01-11 VITALS — BP 142/80 | HR 72 | Temp 98.3°F | Wt 221.0 lb

## 2015-01-11 DIAGNOSIS — J069 Acute upper respiratory infection, unspecified: Secondary | ICD-10-CM | POA: Diagnosis not present

## 2015-01-11 NOTE — Progress Notes (Signed)
SUBJECTIVE:  Ruth Elliott is a 44 y.o. female who complains of coryza, congestion, sneezing, sore throat and dry cough for 8 days. She denies a history of anorexia, chest pain and chills and denies a history of asthma. Patient denies smoke cigarettes.   Current Outpatient Prescriptions on File Prior to Visit  Medication Sig Dispense Refill  . B Complex Vitamins (B COMPLEX PO) Take by mouth.    . cetirizine (ZYRTEC) 10 MG tablet Take 10 mg by mouth daily.    . cetirizine-pseudoephedrine (ZYRTEC-D) 5-120 MG per tablet Take 1 tablet by mouth as needed for allergies.    . Flax Oil-Fish Oil-Borage Oil (FISH OIL-FLAX OIL-BORAGE OIL) CAPS Take by mouth.    . Multiple Vitamin (MULTIVITAMIN) tablet Take 1 tablet by mouth daily.    . norethindrone-ethinyl estradiol 1/35 (ORTHO-NOVUM, NORTREL,CYCLAFEM) tablet Take 1 tablet by mouth daily.     No current facility-administered medications on file prior to visit.    Allergies  Allergen Reactions  . Penicillins Shortness Of Breath    Past Medical History  Diagnosis Date  . Allergy   . Endometriosis     Followed by Lynnell Chad- on Nortrel    Past Surgical History  Procedure Laterality Date  . Appendectomy    . Tonsillectomy  1994    Family History  Problem Relation Age of Onset  . COPD Mother   . Hypertension Mother   . Alzheimer's disease Father   . Hypertension Father     Social History   Social History  . Marital Status: Single    Spouse Name: N/A  . Number of Children: N/A  . Years of Education: N/A   Occupational History  . Not on file.   Social History Main Topics  . Smoking status: Never Smoker   . Smokeless tobacco: Never Used  . Alcohol Use: No  . Drug Use: Yes  . Sexual Activity: Not on file   Other Topics Concern  . Not on file   Social History Narrative   Works at [employer].  Recently moved here from [place].   G0   The PMH, PSH, Social History, Family History, Medications, and allergies have been  reviewed in [facility], and have been updated if relevant.  OBJECTIVE: BP 142/80 mmHg  Pulse 72  Temp(Src) 98.3 F (36.8 C) (Tympanic)  Wt 221 lb (100.245 kg)  SpO2 96%  She appears well, vital signs are as noted. Ears normal.  Throat and pharynx normal.  Neck supple. No adenopathy in the neck. Nose is congested. Sinuses non tender. The chest is clear, without wheezes or rales.  ASSESSMENT:  viral upper respiratory illness  PLAN: Symptomatic therapy suggested: push fluids, rest and return office visit prn if symptoms persist or worsen. Lack of antibiotic effectiveness discussed with her. Call or return to clinic prn if these symptoms worsen or fail to improve as anticipated.

## 2015-01-11 NOTE — Patient Instructions (Signed)
Great to see you. This is a virus, continue doing what you're doing. Call if your symptoms worsen.

## 2015-01-11 NOTE — Progress Notes (Signed)
Pre visit review using our clinic review tool, if applicable. No additional management support is needed unless otherwise documented below in the visit note. 

## 2015-01-17 ENCOUNTER — Telehealth: Payer: Self-pay | Admitting: Family Medicine

## 2015-01-17 MED ORDER — BENZONATATE 100 MG PO CAPS: 100.0000 mg | ORAL_CAPSULE | Freq: Two times a day (BID) | ORAL | Status: DC | PRN

## 2015-01-17 NOTE — Telephone Encounter (Signed)
Is she wanting to try something strong, ie cough medicine with codeine like tussionex or tessalon (weaker but not sedating).  Of note, I am not in office so urgent calls should not be sent to me this week.

## 2015-01-17 NOTE — Telephone Encounter (Signed)
Noted. eRx sent for Tessalon.

## 2015-01-17 NOTE — Telephone Encounter (Signed)
Lm on pts vm requesting a call back 

## 2015-01-17 NOTE — Telephone Encounter (Signed)
Spoke to pt who states that she is still going to work, and is wanting a medication that is not sedating

## 2015-01-17 NOTE — Telephone Encounter (Signed)
Pt saw Dr. Deborra Medina on 01/11/15 and was told to call back if she did not get any better. She said she is still coughing a lot and isn't feeling any better. She is wanting a cough medicine called into her pharmacy for her. The best number to reach her is [phone].

## 2015-01-17 NOTE — Telephone Encounter (Signed)
Pt called back please call back  °

## 2015-01-31 ENCOUNTER — Encounter: Payer: Self-pay | Admitting: Family Medicine

## 2015-01-31 ENCOUNTER — Ambulatory Visit (INDEPENDENT_AMBULATORY_CARE_PROVIDER_SITE_OTHER): Payer: Managed Care, Other (non HMO) | Admitting: Family Medicine

## 2015-01-31 VITALS — BP 127/72 | HR 74 | Temp 99.2°F | Wt 220.5 lb

## 2015-01-31 DIAGNOSIS — J209 Acute bronchitis, unspecified: Secondary | ICD-10-CM

## 2015-01-31 MED ORDER — HYDROCOD POLST-CPM POLST ER 10-8 MG/5ML PO SUER: 5.0000 mL | Freq: Two times a day (BID) | ORAL | Status: DC | PRN

## 2015-01-31 MED ORDER — AZITHROMYCIN 250 MG PO TABS: ORAL_TABLET | ORAL | Status: DC

## 2015-01-31 NOTE — Progress Notes (Signed)
SUBJECTIVE:  Ruth Elliott is a 45 y.o. female who complains of productive cough, myalgias, headache and fever for 20 days. She denies a history of anorexia and chest pain and denies a history of asthma. Patient denies smoke cigarettes.   Current Outpatient Prescriptions on File Prior to Visit  Medication Sig Dispense Refill  . B Complex Vitamins (B COMPLEX PO) Take by mouth.    . cetirizine (ZYRTEC) 10 MG tablet Take 10 mg by mouth daily.    . cetirizine-pseudoephedrine (ZYRTEC-D) 5-120 MG per tablet Take 1 tablet by mouth as needed for allergies.    . Flax Oil-Fish Oil-Borage Oil (FISH OIL-FLAX OIL-BORAGE OIL) CAPS Take by mouth.    . Multiple Vitamin (MULTIVITAMIN) tablet Take 1 tablet by mouth daily.    . norethindrone-ethinyl estradiol 1/35 (ORTHO-NOVUM, NORTREL,CYCLAFEM) tablet Take 1 tablet by mouth daily.     No current facility-administered medications on file prior to visit.    Allergies  Allergen Reactions  . Penicillins Shortness Of Breath    Past Medical History  Diagnosis Date  . Allergy   . Endometriosis     Followed by Ruth Elliott- on Nortrel    Past Surgical History  Procedure Laterality Date  . Appendectomy    . Tonsillectomy  1994    Family History  Problem Relation Age of Onset  . COPD Mother   . Hypertension Mother   . Alzheimer's disease Father   . Hypertension Father     Social History   Social History  . Marital Status: Single    Spouse Name: N/A  . Number of Children: N/A  . Years of Education: N/A   Occupational History  . Not on file.   Social History Main Topics  . Smoking status: Never Smoker   . Smokeless tobacco: Never Used  . Alcohol Use: No  . Drug Use: Yes  . Sexual Activity: Not on file   Other Topics Concern  . Not on file   Social History Narrative   Works at [employer].  Recently moved here from [place].   G0   The PMH, PSH, Social History, Family History, Medications, and allergies have been reviewed in [facility],  and have been updated if relevant.  OBJECTIVE: BP 127/72 mmHg  Pulse 74  Temp(Src) 99.2 F (37.3 C) (Oral)  Wt 220 lb 8 oz (100.018 kg)  SpO2 98%  LMP 12/20/2014 BP Readings from Last 3 Encounters:  01/31/15 127/72  01/11/15 142/80  08/15/14 144/88    She appears well, vital signs are as noted. Ears normal.  Throat and pharynx normal.  Neck supple. No adenopathy in the neck. Nose is congested. Sinuses non tender.  Left lower lobe wheezes, rhonchi  ASSESSMENT:  bronchitis  PLAN: Zpack as directed- PCN allergic. Rx printed and given to pt for tussionex to use as needed for severe cough at night. Symptomatic therapy suggested: push fluids, rest and return office visit prn if symptoms persist or worsen.. Call or return to clinic prn if these symptoms worsen or fail to improve as anticipated.

## 2015-01-31 NOTE — Patient Instructions (Signed)
Great to see you.  Take antibiotic as directed.  Drink lots of fluids.    Treat sympotmatically with Mucinex, nasal saline irrigation, and Tylenol/Ibuprofen.  You can use warm compresses.  Cough suppressant at night.

## 2015-01-31 NOTE — Progress Notes (Signed)
Pre visit review using our clinic review tool, if applicable. No additional management support is needed unless otherwise documented below in the visit note. 

## 2015-02-01 ENCOUNTER — Ambulatory Visit: Payer: Managed Care, Other (non HMO) | Admitting: Family Medicine

## 2015-02-09 ENCOUNTER — Other Ambulatory Visit: Payer: Self-pay | Admitting: Family Medicine

## 2015-02-09 ENCOUNTER — Encounter: Payer: Self-pay | Admitting: Family Medicine

## 2015-02-09 MED ORDER — AZITHROMYCIN 250 MG PO TABS: ORAL_TABLET | ORAL | Status: DC

## 2015-02-20 ENCOUNTER — Ambulatory Visit (INDEPENDENT_AMBULATORY_CARE_PROVIDER_SITE_OTHER): Payer: Managed Care, Other (non HMO) | Admitting: Family Medicine

## 2015-02-20 ENCOUNTER — Encounter: Payer: Self-pay | Admitting: Family Medicine

## 2015-02-20 VITALS — BP 142/82 | HR 71 | Temp 97.8°F | Wt 218.5 lb

## 2015-02-20 DIAGNOSIS — J209 Acute bronchitis, unspecified: Secondary | ICD-10-CM

## 2015-02-20 DIAGNOSIS — IMO0001 Reserved for inherently not codable concepts without codable children: Secondary | ICD-10-CM

## 2015-02-20 DIAGNOSIS — R030 Elevated blood-pressure reading, without diagnosis of hypertension: Secondary | ICD-10-CM | POA: Diagnosis not present

## 2015-02-20 MED ORDER — PREDNISONE 20 MG PO TABS: ORAL_TABLET | ORAL | Status: DC

## 2015-02-20 NOTE — Patient Instructions (Signed)
Great to see you. Please take prednisone as directed- 2 tabs by mouth every morning x 7 days.

## 2015-02-20 NOTE — Progress Notes (Signed)
Subjective:   Patient ID: Ruth Elliott, female    DOB: 11-15-1970, 45 y.o.   MRN: [id number]  AMBRIANA SCHER is a pleasant 45 y.o. year old female who presents to clinic today with Follow-up  on 02/20/2015  HPI:  I saw her on 01/31/2015 with over two week of cough and congestion.  Note reviewed. She did have some isolated wheezes and rhonchi.  Treated with a zpack.  She repeated course of zpack on 12/20/15 as symptoms were persisting.  Here today because cough is not getting better.  Overall feels better but cough is persisting.  Cough has been so severe that she has had multiple episodes of post tussive emesis.  No fevers or chills.  No persistent SOB.  Cough is dry.  tussionex helps but she cannot take it during the day.  Tessalon ineffective.   Current Outpatient Prescriptions on File Prior to Visit  Medication Sig Dispense Refill  . B Complex Vitamins (B COMPLEX PO) Take by mouth.    . cetirizine (ZYRTEC) 10 MG tablet Take 10 mg by mouth daily.    . cetirizine-pseudoephedrine (ZYRTEC-D) 5-120 MG per tablet Take 1 tablet by mouth as needed for allergies.    . Flax Oil-Fish Oil-Borage Oil (FISH OIL-FLAX OIL-BORAGE OIL) CAPS Take by mouth.    . Multiple Vitamin (MULTIVITAMIN) tablet Take 1 tablet by mouth daily.    . norethindrone-ethinyl estradiol 1/35 (ORTHO-NOVUM, NORTREL,CYCLAFEM) tablet Take 1 tablet by mouth daily.     No current facility-administered medications on file prior to visit.    Allergies  Allergen Reactions  . Penicillins Shortness Of Breath    Past Medical History  Diagnosis Date  . Allergy   . Endometriosis     Followed by Lynnell Chad- on Nortrel    Past Surgical History  Procedure Laterality Date  . Appendectomy    . Tonsillectomy  1994    Family History  Problem Relation Age of Onset  . COPD Mother   . Hypertension Mother   . Alzheimer's disease Father   . Hypertension Father     Social History   Social History  . Marital  Status: Single    Spouse Name: N/A  . Number of Children: N/A  . Years of Education: N/A   Occupational History  . Not on file.   Social History Main Topics  . Smoking status: Never Smoker   . Smokeless tobacco: Never Used  . Alcohol Use: No  . Drug Use: Yes  . Sexual Activity: Not on file   Other Topics Concern  . Not on file   Social History Narrative   Works at [employer].  Recently moved here from [place].   G0   The PMH, PSH, Social History, Family History, Medications, and allergies have been reviewed in [facility], and have been updated if relevant.    Review of Systems  Constitutional: Negative.   HENT: Negative.   Respiratory: Positive for cough and chest tightness. Negative for apnea, choking, shortness of breath, wheezing and stridor.   Cardiovascular: Negative.   Gastrointestinal: Negative.   Musculoskeletal: Negative.   Skin: Negative.   Psychiatric/Behavioral: Negative.   All other systems reviewed and are negative.      Objective:    BP 142/82 mmHg  Pulse 71  Temp(Src) 97.8 F (36.6 C) (Oral)  Wt 218 lb 8 oz (99.111 kg)  SpO2 98%  LMP 02/07/2015   Physical Exam  Constitutional: She is oriented to person, place, and time. She appears  well-developed and well-nourished. No distress.  HENT:  Head: Normocephalic and atraumatic.  Eyes: Conjunctivae are normal.  Cardiovascular: Normal rate and regular rhythm.   Pulmonary/Chest: Effort normal and breath sounds normal. No respiratory distress. She has no wheezes. She has no rales.  Musculoskeletal: Normal range of motion.  Neurological: She is alert and oriented to person, place, and time. No cranial nerve deficit.  Skin: Skin is warm and dry. She is not diaphoretic.  Psychiatric: She has a normal mood and affect. Her behavior is normal. Judgment and thought content normal.  Nursing note and vitals reviewed.         Assessment & Plan:   HTN, white coat  Acute bronchitis, unspecified organism No  Follow-up on file.

## 2015-02-20 NOTE — Assessment & Plan Note (Signed)
Exam reassuring- wheezes and rhonchi have resolved but she feels she is wheezing at night and cough is becoming more severe.  Explained to pt that cough can linger.  eRx sent for course of prednisone to help with bronchospasm/inflammation. Call or return to clinic prn if these symptoms worsen or fail to improve as anticipated. The patient indicates understanding of these issues and agrees with the plan.

## 2015-02-20 NOTE — Progress Notes (Signed)
Pre visit review using our clinic review tool, if applicable. No additional management support is needed unless otherwise documented below in the visit note. 

## 2015-03-20 ENCOUNTER — Ambulatory Visit (INDEPENDENT_AMBULATORY_CARE_PROVIDER_SITE_OTHER): Payer: Managed Care, Other (non HMO) | Admitting: Family Medicine

## 2015-03-20 ENCOUNTER — Encounter: Payer: Self-pay | Admitting: Family Medicine

## 2015-03-20 ENCOUNTER — Ambulatory Visit (INDEPENDENT_AMBULATORY_CARE_PROVIDER_SITE_OTHER)
Admission: RE | Admit: 2015-03-20 | Discharge: 2015-03-20 | Disposition: A | Discharge status: Home / Self Care | Payer: Managed Care, Other (non HMO) | Source: Ambulatory Visit | Attending: Family Medicine | Admitting: Family Medicine

## 2015-03-20 VITALS — BP 132/82 | HR 76 | Temp 98.6°F | Wt 219.5 lb

## 2015-03-20 DIAGNOSIS — J209 Acute bronchitis, unspecified: Secondary | ICD-10-CM

## 2015-03-20 NOTE — Assessment & Plan Note (Signed)
Exam reassuring and most of her symptoms have improved. Still complaining of some intermittent SOB although not with exertion and she has been doing a boot camp class without difficulty. ? Allergic asthma/rhinitis. CXR today. She has appt with ENT.

## 2015-03-20 NOTE — Progress Notes (Signed)
Pre visit review using our clinic review tool, if applicable. No additional management support is needed unless otherwise documented below in the visit note. 

## 2015-03-20 NOTE — Progress Notes (Signed)
Subjective:   Patient ID: Ruth Elliott, female    DOB: 15-Dec-1970, 45 y.o.   MRN: [id number]  Ruth Elliott is a pleasant 45 y.o. year old female who presents to clinic today with Follow-up and Cough  on 03/20/2015  HPI:  I saw her on 01/31/2015 with over two week of cough and congestion.  Note reviewed. She did have some isolated wheezes and rhonchi.  Treated with a zpack.  She repeated course of zpack on 12/20/15 as symptoms were persisting.  I then saw her again on 02/19/14- note reviewed.  At that [place], she reported that the cough was persisting but overall she had felt better.  Cough was so severe that she was experiencing episodes of post tussive emesis.  tussionex was helping but she cannot take it during the day.  Tessalon ineffective.   I therefore prescribed a course of prednisone to help with bronchospasm.  She feels prednisone was effective.  Cough has resolved but does still feel like it is "a little hard to breath."  Not DOE.  No CP.  Zyrtec seems to have helped too.    Current Outpatient Prescriptions on File Prior to Visit  Medication Sig Dispense Refill  . B Complex Vitamins (B COMPLEX PO) Take by mouth.    . cetirizine (ZYRTEC) 10 MG tablet Take 10 mg by mouth daily.    . cetirizine-pseudoephedrine (ZYRTEC-D) 5-120 MG per tablet Take 1 tablet by mouth as needed for allergies.    . Flax Oil-Fish Oil-Borage Oil (FISH OIL-FLAX OIL-BORAGE OIL) CAPS Take by mouth.    . Multiple Vitamin (MULTIVITAMIN) tablet Take 1 tablet by mouth daily.    . norethindrone-ethinyl estradiol 1/35 (ORTHO-NOVUM, NORTREL,CYCLAFEM) tablet Take 1 tablet by mouth daily.     No current facility-administered medications on file prior to visit.    Allergies  Allergen Reactions  . Penicillins Shortness Of Breath    Past Medical History  Diagnosis Date  . Allergy   . Endometriosis     Followed by Lynnell Chad- on Nortrel    Past Surgical History  Procedure Laterality Date  .  Appendectomy    . Tonsillectomy  1994    Family History  Problem Relation Age of Onset  . COPD Mother   . Hypertension Mother   . Alzheimer's disease Father   . Hypertension Father     Social History   Social History  . Marital Status: Single    Spouse Name: N/A  . Number of Children: N/A  . Years of Education: N/A   Occupational History  . Not on file.   Social History Main Topics  . Smoking status: Never Smoker   . Smokeless tobacco: Never Used  . Alcohol Use: No  . Drug Use: Yes  . Sexual Activity: Not on file   Other Topics Concern  . Not on file   Social History Narrative   Works at [employer].  Recently moved here from [place].   G0   The PMH, PSH, Social History, Family History, Medications, and allergies have been reviewed in [facility], and have been updated if relevant.    Review of Systems  Constitutional: Negative.   HENT: Negative.   Respiratory: Positive for chest tightness and shortness of breath. Negative for apnea, cough, choking, wheezing and stridor.   Cardiovascular: Negative.   Gastrointestinal: Negative.   Musculoskeletal: Negative.   Skin: Negative.   Psychiatric/Behavioral: Negative.   All other systems reviewed and are negative.  Objective:    BP 178/110 mmHg  Pulse 76  Temp(Src) 98.6 F (37 C) (Oral)  Wt 219 lb 8 oz (99.565 kg)  SpO2 98%  LMP 02/07/2015   Physical Exam  Constitutional: She is oriented to person, place, and time. She appears well-developed and well-nourished. No distress.  HENT:  Head: Normocephalic and atraumatic.  Eyes: Conjunctivae are normal.  Cardiovascular: Normal rate and regular rhythm.   Pulmonary/Chest: Effort normal and breath sounds normal. No respiratory distress. She has no wheezes. She has no rales.  Musculoskeletal: Normal range of motion.  Neurological: She is alert and oriented to person, place, and time. No cranial nerve deficit.  Skin: Skin is warm and dry. She is not diaphoretic.    Psychiatric: She has a normal mood and affect. Her behavior is normal. Judgment and thought content normal.  Nursing note and vitals reviewed.         Assessment & Plan:   Acute bronchitis, unspecified organism No Follow-up on file.

## 2015-03-21 ENCOUNTER — Encounter: Payer: Self-pay | Admitting: Family Medicine

## 2015-03-21 ENCOUNTER — Other Ambulatory Visit: Payer: Self-pay | Admitting: Family Medicine

## 2015-03-21 DIAGNOSIS — I517 Cardiomegaly: Secondary | ICD-10-CM

## 2015-03-21 DIAGNOSIS — R0602 Shortness of breath: Secondary | ICD-10-CM

## 2015-03-22 ENCOUNTER — Encounter: Payer: Self-pay | Admitting: Cardiology

## 2015-03-22 ENCOUNTER — Ambulatory Visit (INDEPENDENT_AMBULATORY_CARE_PROVIDER_SITE_OTHER): Payer: Managed Care, Other (non HMO) | Admitting: Cardiology

## 2015-03-22 VITALS — BP 186/127 | HR 83 | Ht 65.0 in | Wt 217.5 lb

## 2015-03-22 DIAGNOSIS — R030 Elevated blood-pressure reading, without diagnosis of hypertension: Secondary | ICD-10-CM

## 2015-03-22 DIAGNOSIS — I517 Cardiomegaly: Secondary | ICD-10-CM | POA: Diagnosis not present

## 2015-03-22 DIAGNOSIS — R0602 Shortness of breath: Secondary | ICD-10-CM

## 2015-03-22 DIAGNOSIS — IMO0001 Reserved for inherently not codable concepts without codable children: Secondary | ICD-10-CM

## 2015-03-22 NOTE — Progress Notes (Signed)
Cardiology Office Note   Date:  03/22/2015   ID:  TRECIA SOFIELD, DOB [birth year], MRN [id number]  Referring Doctor:  Arnette Norris, MD   Cardiologist:   Wende Bushy, MD   Reason for consultation:  Chief Complaint  Patient presents with  . other    finding of cardiomegaly on chest xray      History of Present Illness: Ruth Elliott is a 45 y.o. female who presents for Finding of cardiomegaly noted on chest x-ray. The chest x-ray was done 03/20/2015 as a follow-up for bronchitis. Based on the reports and what patient was told, cardiomegaly was noted on the x-ray or no evidence of CHF.  Patient had issues with shortness of breath due to a bout with bronchitis or viral upper respiratory tract infection. After getting treatment for this, she reports that this is actually resolved. She attended the boot camp/exercise recently and she did very well with that. She has no chest pain, shortness of breath with exertion, chest pressure. No nausea, vomiting, diaphoresis, loss of consciousness.  She was just concerned about the cardiomegaly noted on the x-ray and hence wanted to see a cardiologist.    ROS:  Please see the history of present illness. Aside from mentioned under HPI, all other systems are reviewed and negative.     Past Medical History  Diagnosis Date  . Allergy   . Endometriosis     Followed by Lynnell Chad- on Nortrel    Past Surgical History  Procedure Laterality Date  . Appendectomy    . Tonsillectomy  1994  . Ovarian cyst surgery    . Nasal sinus surgery       reports that she has never smoked. She has never used smokeless tobacco. She reports that she drinks alcohol. She reports that she does not use illicit drugs.   family history includes Alzheimer's disease in her father; COPD in her mother; Hypertension in her father and mother.   Current Outpatient Prescriptions  Medication Sig Dispense Refill  . B Complex Vitamins (B COMPLEX PO) Take by mouth.     . cetirizine-pseudoephedrine (ZYRTEC-D) 5-120 MG per tablet Take 1 tablet by mouth as needed for allergies.    . Flax Oil-Fish Oil-Borage Oil (FISH OIL-FLAX OIL-BORAGE OIL) CAPS Take by mouth.    . GuaiFENesin (TUSSIN PO) Take by mouth as needed.    . Multiple Vitamin (MULTIVITAMIN) tablet Take 1 tablet by mouth daily.    . NON FORMULARY Thyrotain Takes 1 capsule daily.    . norethindrone-ethinyl estradiol 1/35 (ORTHO-NOVUM, NORTREL,CYCLAFEM) tablet Take 1 tablet by mouth daily.    . Probiotic Product (TRUBIOTICS PO) Take by mouth daily.     No current facility-administered medications for this visit.    Allergies: Penicillins    PHYSICAL EXAM: VS:  BP 186/127 mmHg  Pulse 83  Ht 5\' 5"  (1.651 m)  Wt 217 lb 8 oz (98.657 kg)  BMI 36.19 kg/m2  LMP 02/07/2015 , Body mass index is 36.19 kg/(m^2). Wt Readings from Last 3 Encounters:  03/22/15 217 lb 8 oz (98.657 kg)  03/20/15 219 lb 8 oz (99.565 kg)  02/20/15 218 lb 8 oz (99.111 kg)    GENERAL:  well developed, well nourished, obese, not in acute distress HEENT: normocephalic, pink conjunctivae, anicteric sclerae, no xanthelasma, normal dentition, oropharynx clear NECK:  no neck vein engorgement, JVP normal, no hepatojugular reflux, carotid upstroke brisk and symmetric, no bruit, no thyromegaly, no lymphadenopathy LUNGS:  good respiratory effort, clear  to auscultation bilaterally CV:  PMI not displaced, no thrills, no lifts, S1 and S2 within normal limits, no palpable S3 or S4, no murmurs, no rubs, no gallops ABD:  Soft, nontender, nondistended, normoactive bowel sounds, no abdominal aortic bruit, no hepatomegaly, no splenomegaly MS: nontender back, no kyphosis, no scoliosis, no joint deformities EXT:  2+ DP/PT pulses, no edema, no varicosities, no cyanosis, no clubbing SKIN: warm, nondiaphoretic, normal turgor, no ulcers NEUROPSYCH: alert, oriented to person, place, and time, sensory/motor grossly intact, normal mood, appropriate  affect  Recent Labs: 08/10/2014: ALT 15; BUN 16; Creatinine, Ser 0.60; Platelets 175; Potassium 4.3; Sodium 137; TSH 0.818   Lipid Panel    Component Value Date/Time   CHOL 115 08/10/2014 1028   TRIG 91 08/10/2014 1028   HDL 38* 08/10/2014 1028   CHOLHDL 3.0 08/10/2014 1028   LDLCALC 59 08/10/2014 1028     Other studies Reviewed:  EKG:  EKG is ordered today. The ekg ordered today was personally reviewed by me and it reveals sinus rhythm, 83 BPM. QT 360 ms/QTC 423 ms. Nonspecific ST-T wave changes.  Additional studies/ records that were reviewed personally reviewed by me today include: None available   ASSESSMENT AND PLAN:  1. Cardiomegaly noted on chest x-ray from 03/20/2015.  Recommend echocardiogram for a more definitive evaluation.  Patient denies any symptoms of chest pain or shortness of breath. She exercises on a regular basis and has no symptoms with boot camp.  2. [place] hypertension Patient knows that her blood pressure is extremely high at a doctor's office. She monitors it at home and it's never this high. Recommend to continue monitoring blood pressure and to report any persistence of blood pressure in the 140s over 80s.  Labs/ tests ordered today include:  Orders Placed This Encounter  Procedures  . EKG 12-Lead  . Echocardiogram    I had a lengthy and detailed discussion with the patient regarding diagnoses, prognosis, diagnostic options, treatment options, and side effects of medications.   I counseled the patient on importance of lifestyle modification including heart healthy diet, regular physical activity  Disposition:   FU with undersigned After tests Signed, Wende Bushy, MD  03/22/2015 3:13 PM    [place]

## 2015-03-22 NOTE — Patient Instructions (Addendum)
Medication Instructions:  Your physician recommends that you continue on your current medications as directed. Please refer to the Current Medication list given to you today.   Labwork: None Ordered  Testing/Procedures: Your physician has requested that you have an echocardiogram. Echocardiography is a painless test that uses sound waves to create images of your heart. It provides your doctor with information about the size and shape of your heart and how well your heart's chambers and valves are working. This procedure takes approximately one hour. There are no restrictions for this procedure.  Date & Time: _______________________________________________   Follow-Up: Your physician recommends that you schedule a follow-up appointment after echo to review results.  Date & Time: _________________________________________________   If you need a refill on your cardiac medications before your next appointment, please call your pharmacy.  Echocardiogram An echocardiogram, or echocardiography, uses sound waves (ultrasound) to produce an image of your heart. The echocardiogram is simple, painless, obtained within a short period of time, and offers valuable information to your health care provider. The images from an echocardiogram can provide information such as:  Evidence of coronary artery disease (CAD).  Heart size.  Heart muscle function.  Heart valve function.  Aneurysm detection.  Evidence of a past heart attack.  Fluid buildup around the heart.  Heart muscle thickening.  Assess heart valve function. LET [facility] CARE PROVIDER KNOW ABOUT:  Any allergies you have.  All medicines you are taking, including vitamins, herbs, eye drops, creams, and over-the-counter medicines.  Previous problems you or members of your family have had with the use of anesthetics.  Any blood disorders you have.  Previous surgeries you have had.  Medical conditions you  have.  Possibility of pregnancy, if this applies. BEFORE THE PROCEDURE  No special preparation is needed. Eat and drink normally.  PROCEDURE   In order to produce an image of your heart, gel will be applied to your chest and a wand-like tool (transducer) will be moved over your chest. The gel will help transmit the sound waves from the transducer. The sound waves will harmlessly bounce off your heart to allow the heart images to be captured in real-time motion. These images will then be recorded.  You may need an IV to receive a medicine that improves the quality of the pictures. AFTER THE PROCEDURE You may return to your normal schedule including diet, activities, and medicines, unless your health care provider tells you otherwise.   This information is not intended to replace advice given to you by your health care provider. Make sure you discuss any questions you have with your health care provider.   Document Released: 12/29/1999 Document Revised: 01/21/2014 Document Reviewed: 09/07/2012 Elsevier Interactive Patient Education [employer].

## 2015-03-23 ENCOUNTER — Encounter: Payer: Self-pay | Admitting: Family Medicine

## 2015-03-23 ENCOUNTER — Ambulatory Visit (INDEPENDENT_AMBULATORY_CARE_PROVIDER_SITE_OTHER): Payer: Managed Care, Other (non HMO) | Admitting: Family Medicine

## 2015-03-23 VITALS — BP 162/92 | HR 79 | Temp 98.5°F | Wt 215.5 lb

## 2015-03-23 DIAGNOSIS — I10 Essential (primary) hypertension: Secondary | ICD-10-CM

## 2015-03-23 DIAGNOSIS — R030 Elevated blood-pressure reading, without diagnosis of hypertension: Secondary | ICD-10-CM

## 2015-03-23 DIAGNOSIS — IMO0001 Reserved for inherently not codable concepts without codable children: Secondary | ICD-10-CM

## 2015-03-23 MED ORDER — HYDROCHLOROTHIAZIDE 12.5 MG PO CAPS: 12.5000 mg | ORAL_CAPSULE | Freq: Every day | ORAL | Status: DC

## 2015-03-23 NOTE — Patient Instructions (Signed)
We are starting 12.5 mg daily. Come see me in 2 weeks or call me with your readings.

## 2015-03-23 NOTE — Progress Notes (Signed)
Subjective:   Patient ID: Ruth Elliott, female    DOB: [birth year], 45 y.o.   MRN: [id number]  Ruth Elliott is a pleasant 45 y.o. year old female who presents to clinic today with Hypertension  on 03/23/2015  HPI:  H/o white coat HTN but BP has been slowly trending up. I referred her to cardiology for chest pressure and cardiomegaly on CXR for work up/echo. Was seen yesterday and BP was elevated there as well.  CP has resolved.  Echo is in two weeks and she is very very anxious about her heart now.  Current Outpatient Prescriptions on File Prior to Visit  Medication Sig Dispense Refill  . B Complex Vitamins (B COMPLEX PO) Take by mouth.    . cetirizine-pseudoephedrine (ZYRTEC-D) 5-120 MG per tablet Take 1 tablet by mouth as needed for allergies.    . Flax Oil-Fish Oil-Borage Oil (FISH OIL-FLAX OIL-BORAGE OIL) CAPS Take by mouth.    . GuaiFENesin (TUSSIN PO) Take by mouth as needed.    . Multiple Vitamin (MULTIVITAMIN) tablet Take 1 tablet by mouth daily.    . NON FORMULARY Thyrotain Takes 1 capsule daily.    . norethindrone-ethinyl estradiol 1/35 (ORTHO-NOVUM, NORTREL,CYCLAFEM) tablet Take 1 tablet by mouth daily.    . Probiotic Product (TRUBIOTICS PO) Take by mouth daily.     No current facility-administered medications on file prior to visit.    Allergies  Allergen Reactions  . Penicillins Shortness Of Breath    Past Medical History  Diagnosis Date  . Allergy   . Endometriosis     Followed by Lynnell Chad- on Nortrel    Past Surgical History  Procedure Laterality Date  . Appendectomy    . Tonsillectomy  1994  . Ovarian cyst surgery    . Nasal sinus surgery      Family History  Problem Relation Age of Onset  . COPD Mother   . Hypertension Mother   . Alzheimer's disease Father   . Hypertension Father     Social History   Social History  . Marital Status: Single    Spouse Name: N/A  . Number of Children: N/A  . Years of Education: N/A    Occupational History  . Not on file.   Social History Main Topics  . Smoking status: Never Smoker   . Smokeless tobacco: Never Used  . Alcohol Use: 0.0 oz/week    0 Standard drinks or equivalent per week  . Drug Use: No  . Sexual Activity: Not on file   Other Topics Concern  . Not on file   Social History Narrative   Works at [employer].  Recently moved here from [place].   G0   The PMH, PSH, Social History, Family History, Medications, and allergies have been reviewed in [facility], and have been updated if relevant.   Review of Systems  Constitutional: Negative.   Eyes: Negative.   Respiratory: Negative.   Cardiovascular: Negative.   Musculoskeletal: Negative.   Skin: Negative.   Neurological: Negative.   Hematological: Negative.   Psychiatric/Behavioral: Negative.   All other systems reviewed and are negative.      Objective:    BP 172/90 mmHg  Pulse 79  Temp(Src) 98.5 F (36.9 C) (Oral)  Wt 215 lb 8 oz (97.75 kg)  SpO2 98% Wt Readings from Last 3 Encounters:  03/23/15 215 lb 8 oz (97.75 kg)  03/22/15 217 lb 8 oz (98.657 kg)  03/20/15 219 lb 8 oz (99.565 kg)  Physical Exam  Constitutional: She is oriented to person, place, and time. She appears well-developed and well-nourished. No distress.  HENT:  Head: Normocephalic and atraumatic.  Eyes: Conjunctivae are normal.  Cardiovascular: Normal rate and regular rhythm.   Pulmonary/Chest: Effort normal.  Musculoskeletal: Normal range of motion.  Neurological: She is alert and oriented to person, place, and time. No cranial nerve deficit.  Skin: Skin is warm and dry. She is not diaphoretic.  Psychiatric: Her mood appears anxious.  Nursing note and vitals reviewed.         Assessment & Plan:   HTN, white coat No Follow-up on file.

## 2015-03-23 NOTE — Assessment & Plan Note (Signed)
New- with h/o white coat HTN that typically normalizes once she has been sitting in office for awhile and rechecked.  Not improving much today.  Likely due to anxiety related to her heart work up. Will start low dose HCTZ today - follow up with me in 2 weeks.  She will also check BP at home. The patient indicates understanding of these issues and agrees with the plan.

## 2015-03-23 NOTE — Progress Notes (Signed)
Pre visit review using our clinic review tool, if applicable. No additional management support is needed unless otherwise documented below in the visit note. 

## 2015-03-27 ENCOUNTER — Ambulatory Visit (INDEPENDENT_AMBULATORY_CARE_PROVIDER_SITE_OTHER): Payer: Managed Care, Other (non HMO) | Admitting: Family Medicine

## 2015-03-27 ENCOUNTER — Encounter: Payer: Self-pay | Admitting: Family Medicine

## 2015-03-27 VITALS — BP 172/112 | HR 76 | Temp 98.2°F | Wt 217.5 lb

## 2015-03-27 DIAGNOSIS — I10 Essential (primary) hypertension: Secondary | ICD-10-CM | POA: Diagnosis not present

## 2015-03-27 MED ORDER — LISINOPRIL-HYDROCHLOROTHIAZIDE 10-12.5 MG PO TABS: 1.0000 | ORAL_TABLET | Freq: Every day | ORAL | Status: DC

## 2015-03-27 NOTE — Patient Instructions (Signed)
Great to see you. STOP taking HCTZ 12.5 mg daily. Start taking HCTZ 12.5 lisinopril mg daily.  Check your blood pressures and call me.  Please stop by to see Rosaria Ferries on your way out.

## 2015-03-27 NOTE — Assessment & Plan Note (Signed)
BP remains elevated here and on her own cuff. She remains asymptomatic. Will increase her rx to HCTZ 12.5- Lisinopril 10 mg daily. Renal artery [name] ordered. She will call me tomorrow with BP readings. The patient indicates understanding of these issues and agrees with the plan.

## 2015-03-27 NOTE — Progress Notes (Signed)
Subjective:   Patient ID: Ruth Elliott, female    DOB: Sep 14, 1970, 45 y.o.   MRN: [id number]  Ruth Elliott is a pleasant 45 y.o. year old female who presents to clinic today with Follow-up  on 03/27/2015  HPI:  H/o white coat HTN but BP has been slowly trending up. I referred her to cardiology for chest pressure and cardiomegaly on CXR for work up/echo. Has been anxious awaiting appt for echo.  CP has resolved.  We therefore started her on HCTZ 12.5 mg daily on 03/27/15- previous notes reviewed.  Has had no noticeable side effects.  Brings her BP cuff that she bought today in with her to compare with ours. Has not used it at home yet. Current Outpatient Prescriptions on File Prior to Visit  Medication Sig Dispense Refill  . B Complex Vitamins (B COMPLEX PO) Take by mouth.    . cetirizine-pseudoephedrine (ZYRTEC-D) 5-120 MG per tablet Take 1 tablet by mouth as needed for allergies.    . Flax Oil-Fish Oil-Borage Oil (FISH OIL-FLAX OIL-BORAGE OIL) CAPS Take by mouth.    . GuaiFENesin (TUSSIN PO) Take by mouth as needed.    . hydrochlorothiazide (MICROZIDE) 12.5 MG capsule Take 1 capsule (12.5 mg total) by mouth daily. 30 capsule 3  . Multiple Vitamin (MULTIVITAMIN) tablet Take 1 tablet by mouth daily.    . NON FORMULARY Thyrotain Takes 1 capsule daily.    . Probiotic Product (TRUBIOTICS PO) Take by mouth daily.     No current facility-administered medications on file prior to visit.    Allergies  Allergen Reactions  . Penicillins Shortness Of Breath    Past Medical History  Diagnosis Date  . Allergy   . Endometriosis     Followed by Lynnell Chad- on Nortrel    Past Surgical History  Procedure Laterality Date  . Appendectomy    . Tonsillectomy  1994  . Ovarian cyst surgery    . Nasal sinus surgery      Family History  Problem Relation Age of Onset  . COPD Mother   . Hypertension Mother   . Alzheimer's disease Father   . Hypertension Father      Social History   Social History  . Marital Status: Single    Spouse Name: N/A  . Number of Children: N/A  . Years of Education: N/A   Occupational History  . Not on file.   Social History Main Topics  . Smoking status: Never Smoker   . Smokeless tobacco: Never Used  . Alcohol Use: 0.0 oz/week    0 Standard drinks or equivalent per week  . Drug Use: No  . Sexual Activity: Not on file   Other Topics Concern  . Not on file   Social History Narrative   Works at [employer].  Recently moved here from [place].   G0   The PMH, PSH, Social History, Family History, Medications, and allergies have been reviewed in [facility], and have been updated if relevant.   Review of Systems  Constitutional: Negative.   Eyes: Negative.   Respiratory: Negative.   Cardiovascular: Negative.   Musculoskeletal: Negative.   Skin: Negative.   Neurological: Negative.   Hematological: Negative.   Psychiatric/Behavioral: Negative.   All other systems reviewed and are negative.      Objective:    BP 172/112 mmHg  Pulse 76  Temp(Src) 98.2 F (36.8 C) (Oral)  Wt 217 lb 8 oz (98.657 kg)  SpO2 98% Wt Readings from Last  3 Encounters:  03/27/15 217 lb 8 oz (98.657 kg)  03/23/15 215 lb 8 oz (97.75 kg)  03/22/15 217 lb 8 oz (98.657 kg)     Physical Exam  Constitutional: She is oriented to person, place, and time. She appears well-developed and well-nourished. No distress.  HENT:  Head: Normocephalic and atraumatic.  Eyes: Conjunctivae are normal.  Cardiovascular: Normal rate and regular rhythm.   Pulmonary/Chest: Effort normal.  Musculoskeletal: Normal range of motion.  Neurological: She is alert and oriented to person, place, and time. No cranial nerve deficit.  Skin: Skin is warm and dry. She is not diaphoretic.  Psychiatric: Her mood appears anxious.  Nursing note and vitals reviewed.         Assessment & Plan:   Essential hypertension No Follow-up on file.

## 2015-03-28 ENCOUNTER — Encounter: Payer: Self-pay | Admitting: Family Medicine

## 2015-03-28 ENCOUNTER — Telehealth: Payer: Self-pay

## 2015-03-28 NOTE — Telephone Encounter (Signed)
Pt left v/m; pt seen 03/27/15; pt was to cb with BP reading; 03/28/15 BP this AM was 138/88. Dose for new BC pill 1 mg/0.02 mg.

## 2015-03-28 NOTE — Telephone Encounter (Signed)
Great news!!  Please update chart with OCP.

## 2015-03-29 NOTE — Telephone Encounter (Signed)
Lm on pts vm requesting the name of the new BCP

## 2015-03-30 ENCOUNTER — Telehealth: Payer: Self-pay | Admitting: Family Medicine

## 2015-03-30 NOTE — Telephone Encounter (Signed)
[place] Patient Name: Ruth Elliott DOB: 01-18-1970 Initial Comment Caller states started taking Lisinopril-HCTZ on Tuesday, started having stomach pains yesterday - wants to know if it is from the med or something she ate Nurse Assessment Nurse: Markus Daft, RN, Sherre Poot Date/Time (Eastern Time): 03/30/2015 11:51:00 AM Confirm and document reason for call. If symptomatic, describe symptoms. You must click the next button to save text entered. ---Caller states started taking Lisinopril-HCTZ on Tuesday this week. She ate a multi grain bagel yesterday for breakfast. She started having constant upper abdominal pain yesterday soon after having one pill and the bagel. Seems to be lessening, rates 2/10 now. At its worse it was a 5/10. No vomiting or diarrhea,. She wants to know if it is from the med or from the bagel? She thinks its the bagel, but wanted to be sure. She was able to do her gym class last night. She has had normal BM today. Has the patient traveled out of the country within the last 30 days? ---Not Applicable Does the patient have any new or worsening symptoms? ---Yes Will a triage be completed? ---Yes Related visit to physician within the last 2 weeks? ---Yes Does the PT have any chronic conditions? (i.e. diabetes, asthma, etc.) ---Yes List chronic conditions. ---HTN, Is the patient pregnant or possibly pregnant? (Ask all females between the ages of 24-55) ---No Is this a behavioral health or substance abuse call? ---No Guidelines Guideline Title Affirmed Question Affirmed Notes Abdominal Pain - Upper [1] Pain lasts > 10 minutes AND [2] age > 41 AND [3] at least one cardiac risk factor (i.e., hypertension, diabetes, obesity, smoker or strong family history of heart disease) Final Disposition User Go to ED Now Markus Daft, RN, [place] Referrals [place]  UNDECIDED Disagree/Comply: Comply Guidelines Guideline Title Affirmed Question Affirmed Notes Nurse Date/Time Eilene Ghazi Time) Abdominal Pain - Upper [1] Pain lasts > 10 minutes AND [2] age > 62 AND [3] at least one cardiac risk factor (i.e., hypertension, diabetes, obesity, smoker or strong family history of heart disease) Markus Daft, RN, [facility] 03/30/2015 11:56:49 AM Disp. Time Eilene Ghazi Time) Disposition Final User 03/30/2015 12:04:03 PM Called On-Call Provider Caban, RN, Sherre Poot 03/30/2015 11:59:59 AM Go to ED Now Yes Markus Daft, RN, [place] Understands: Yes Disagree/Comply: Comply Care Advice Given Per Guideline GO TO ED NOW: You need to be seen in the Emergency Department. Go to the ER at ___________ [place] now. Drive carefully. CALL EMS 911 IF: * Confusion occurs * Passes out or becomes too weak to stand * Severe difficulty breathing occurs. CARE ADVICE given per Abdominal Pain, Upper (Adult) guideline. Referrals GO TO FACILITY UNDECIDED Paging DoctorName Phone DateTime Result/Outcome Message Type Notes Called On Call Provider - Reached Doctor Paged - Allie Bossier NP 03/30/2015 12:08:10 PM Spoke with On Call - Physician [place] Message Result: Allie Bossier notified, and agreed that most likely due to [place]. Downgrade to watch for now, and call back if severe abdominal pain, or any other s/s. - Caller notified, and verb. understanding. She will cont. her BP med. (RN had confirmed with [email or web address] that very unlikely to be r/t med).

## 2015-04-04 ENCOUNTER — Ambulatory Visit (INDEPENDENT_AMBULATORY_CARE_PROVIDER_SITE_OTHER): Payer: Managed Care, Other (non HMO)

## 2015-04-04 ENCOUNTER — Other Ambulatory Visit: Payer: Self-pay

## 2015-04-04 DIAGNOSIS — I517 Cardiomegaly: Secondary | ICD-10-CM

## 2015-04-05 ENCOUNTER — Ambulatory Visit: Payer: Managed Care, Other (non HMO) | Admitting: Cardiology

## 2015-04-07 ENCOUNTER — Encounter: Payer: Self-pay | Admitting: Family Medicine

## 2015-04-11 ENCOUNTER — Ambulatory Visit (INDEPENDENT_AMBULATORY_CARE_PROVIDER_SITE_OTHER): Payer: Managed Care, Other (non HMO) | Admitting: Cardiology

## 2015-04-11 ENCOUNTER — Encounter: Payer: Self-pay | Admitting: Cardiology

## 2015-04-11 VITALS — BP 138/92 | HR 81 | Ht 65.0 in | Wt 212.8 lb

## 2015-04-11 DIAGNOSIS — I517 Cardiomegaly: Secondary | ICD-10-CM

## 2015-04-11 DIAGNOSIS — I10 Essential (primary) hypertension: Secondary | ICD-10-CM

## 2015-04-11 NOTE — Progress Notes (Signed)
Cardiology Office Note   Date:  04/11/2015   ID:  JULIEANA DEMIRJIAN, DOB [birth year], MRN [id number]  Referring Doctor:  Arnette Norris, MD   Cardiologist:   Wende Bushy, MD   Reason for consultation:  Chief Complaint  Patient presents with  . other    F/u echo. Meds reviewed verbally with pt.      History of Present Illness: Ruth Elliott is a 45 y.o. female who presents for Follow-up after test  Patient has been doing well. No symptoms of chest pain or shortness breath with exercise. Blood pressure is otherwise unremarkable or within normal limits at home.   ROS:  Please see the history of present illness. Aside from mentioned under HPI, all other systems are reviewed and negative.     Past Medical History  Diagnosis Date  . Allergy   . Endometriosis     Followed by Lynnell Chad- on Nortrel    Past Surgical History  Procedure Laterality Date  . Appendectomy    . Tonsillectomy  1994  . Ovarian cyst surgery    . Nasal sinus surgery       reports that she has never smoked. She has never used smokeless tobacco. She reports that she drinks alcohol. She reports that she does not use illicit drugs.   family history includes Alzheimer's disease in her father; COPD in her mother; Hypertension in her father and mother.   Current Outpatient Prescriptions  Medication Sig Dispense Refill  . B Complex Vitamins (B COMPLEX PO) Take by mouth.    . cetirizine-pseudoephedrine (ZYRTEC-D) 5-120 MG per tablet Take 1 tablet by mouth as needed for allergies.    . Flax Oil-Fish Oil-Borage Oil (FISH OIL-FLAX OIL-BORAGE OIL) CAPS Take by mouth.    . GuaiFENesin (TUSSIN PO) Take by mouth as needed.    [name] lisinopril-hydrochlorothiazide (PRINZIDE,ZESTORETIC) 10-12.5 MG tablet Take 1 tablet by mouth daily. 90 tablet 3  . Multiple Vitamin (MULTIVITAMIN) tablet Take 1 tablet by mouth daily.    . NON FORMULARY Thyrotain Takes 1 capsule daily.    [name] NORTREL 1/35, 21, tablet Take 1 tablet by  mouth daily.     . Probiotic Product (TRUBIOTICS PO) Take by mouth daily.     No current facility-administered medications for this visit.    Allergies: Penicillins    PHYSICAL EXAM: VS:  BP 138/92 mmHg  Pulse 81  Ht 5\' 5"  (1.651 m)  Wt 212 lb 12 oz (96.503 kg)  BMI 35.40 kg/m2 , Body mass index is 35.4 kg/(m^2). Wt Readings from Last 3 Encounters:  04/11/15 212 lb 12 oz (96.503 kg)  03/27/15 217 lb 8 oz (98.657 kg)  03/23/15 215 lb 8 oz (97.75 kg)    GENERAL:  well developed, well nourished, obese, not in acute distress HEENT: normocephalic, pink conjunctivae, anicteric sclerae, no xanthelasma, normal dentition, oropharynx clear NECK:  no neck vein engorgement, JVP normal, no hepatojugular reflux, carotid upstroke brisk and symmetric, no bruit, no thyromegaly, no lymphadenopathy LUNGS:  good respiratory effort, clear to auscultation bilaterally CV:  PMI not displaced, no thrills, no lifts, S1 and S2 within normal limits, no palpable S3 or S4, no murmurs, no rubs, no gallops ABD:  Soft, nontender, nondistended, normoactive bowel sounds, no abdominal aortic bruit, no hepatomegaly, no splenomegaly MS: nontender back, no kyphosis, no scoliosis, no joint deformities EXT:  2+ DP/PT pulses, no edema, no varicosities, no cyanosis, no clubbing SKIN: warm, nondiaphoretic, normal turgor, no ulcers NEUROPSYCH: alert, oriented to  person, place, and time, sensory/motor grossly intact, normal mood, appropriate affect  Recent Labs: 08/10/2014: ALT 15; BUN 16; Creatinine, Ser 0.60; Platelets 175; Potassium 4.3; Sodium 137; TSH 0.818   Lipid Panel    Component Value Date/Time   CHOL 115 08/10/2014 1028   TRIG 91 08/10/2014 1028   HDL 38* 08/10/2014 1028   CHOLHDL 3.0 08/10/2014 1028   LDLCALC 59 08/10/2014 1028     Other studies Reviewed:  EKG:  The ekg ordered 03/22/2015 was personally reviewed by me and it reveals sinus rhythm, 83 BPM. QT 360 ms/QTC 423 ms. Nonspecific ST-T wave  changes.  Additional studies/ records that were reviewed personally reviewed by me today include: Echocardiogram 04/04/2015: - Left ventricle: The cavity size was normal. There was moderate  concentric hypertrophy. Systolic function was normal. The  estimated ejection fraction was in the range of 60% to 65%. Wall  motion was normal; there were no regional wall motion  abnormalities. Doppler parameters are consistent with abnormal  left ventricular relaxation (grade 1 diastolic dysfunction). - Left atrium: The atrium was mildly dilated. - Tricuspid valve: There was trivial regurgitation. - Pulmonary arteries: Systolic pressure was within the normal  range.   ASSESSMENT AND PLAN:  Cardiomegaly noted on chest x-ray from 03/20/2015.  Normal left ventricular size on echocardiogram.   Whitecoat hypertension Patient knows that her blood pressure is extremely high at a doctor's office. She monitors it at home and it's never this high. Recommend to continue monitoring blood pressure and to report any persistence of blood pressure in the 140s over 80s, since there is moderate concentric hypertrophy on echocardiogram as well as left atrial mild dilatation. Patient says that sometimes her blood pressure is in the low 1 teens and she would not feel well when her blood pressures that low. She is there has been increasing her blood pressure medication.  LVH Moderate concentric hypertrophy as discussed on her hypertension. Recommend repeat echocardiogram in 2 years to continue to monitor.  Labs/ tests ordered today include:  No orders of the defined types were placed in this encounter.    I had a lengthy and detailed discussion with the patient regarding diagnoses, prognosis, diagnostic options, treatment options, and side effects of medications.   I counseled the patient on importance of lifestyle modification including heart healthy diet, regular physical activity  Disposition:   FU with  undersigned . Signed, Wende Bushy, MD  04/11/2015 3:35 PM    [facility]

## 2015-04-11 NOTE — Patient Instructions (Signed)
Medication Instructions:  Your physician recommends that you continue on your current medications as directed. Please refer to the Current Medication list given to you today.   Labwork: None Ordered  Testing/Procedures: None Ordered  Follow-Up: Your physician recommends that you schedule a follow-up appointment as needed.  Any Other Special Instructions Will Be Listed Below (If Applicable).     If you need a refill on your cardiac medications before your next appointment, please call your pharmacy.

## 2015-04-12 ENCOUNTER — Ambulatory Visit (INDEPENDENT_AMBULATORY_CARE_PROVIDER_SITE_OTHER): Payer: Managed Care, Other (non HMO) | Admitting: Family Medicine

## 2015-04-12 ENCOUNTER — Encounter: Payer: Self-pay | Admitting: Family Medicine

## 2015-04-12 VITALS — BP 146/98 | HR 68 | Temp 98.4°F | Ht 65.0 in | Wt 211.1 lb

## 2015-04-12 DIAGNOSIS — E669 Obesity, unspecified: Secondary | ICD-10-CM

## 2015-04-12 DIAGNOSIS — R030 Elevated blood-pressure reading, without diagnosis of hypertension: Secondary | ICD-10-CM

## 2015-04-12 DIAGNOSIS — I517 Cardiomegaly: Secondary | ICD-10-CM

## 2015-04-12 DIAGNOSIS — IMO0001 Reserved for inherently not codable concepts without codable children: Secondary | ICD-10-CM

## 2015-04-12 DIAGNOSIS — I10 Essential (primary) hypertension: Secondary | ICD-10-CM

## 2015-04-12 MED ORDER — LISINOPRIL-HYDROCHLOROTHIAZIDE 10-12.5 MG PO TABS: 1.0000 | ORAL_TABLET | Freq: Every day | ORAL | Status: DC

## 2015-04-12 NOTE — Assessment & Plan Note (Signed)
Recent echo. Per cardiology, repeat echo in 2 years.

## 2015-04-12 NOTE — Progress Notes (Signed)
Subjective:   Patient ID: Ruth Elliott, female    DOB: January 18, 1970, 45 y.o.   MRN: [id number]  IBIS STEVEN is a pleasant 45 y.o. year old female who presents to clinic today with Follow-up  on 04/12/2015  HPI:  HTN- H/o white coat HTN but BP has been slowly trending up so we started her on rx earlier this month.  Currently taking HCTZ 12.5- Lisinopril 10 mg daily. Denies any side effects.  I referred her to cardiology for chest pressure and cardiomegaly on CXR for work up/echo. Was seen yesterday to review results of echo- note reviewed- saw Wende Bushy, MD.  LVH on echo- moderate concentric hypertrophy.  Advised repeat echo in 2 years.  Continues to work on diet and exercise and has been losing weight.  Checks her BP routinely at home twice daily and mainly ranging in 120s/70s.   Wt Readings from Last 3 Encounters:  04/12/15 211 lb 1.9 oz (95.763 kg)  04/11/15 212 lb 12 oz (96.503 kg)  03/27/15 217 lb 8 oz (98.657 kg)     Current Outpatient Prescriptions on File Prior to Visit  Medication Sig Dispense Refill  . B Complex Vitamins (B COMPLEX PO) Take by mouth.    . cetirizine-pseudoephedrine (ZYRTEC-D) 5-120 MG per tablet Take 1 tablet by mouth as needed for allergies.    . Flax Oil-Fish Oil-Borage Oil (FISH OIL-FLAX OIL-BORAGE OIL) CAPS Take by mouth.    . GuaiFENesin (TUSSIN PO) Take by mouth as needed.    [name] lisinopril-hydrochlorothiazide (PRINZIDE,ZESTORETIC) 10-12.5 MG tablet Take 1 tablet by mouth daily. 90 tablet 3  . Multiple Vitamin (MULTIVITAMIN) tablet Take 1 tablet by mouth daily.    . NON FORMULARY Thyrotain Takes 1 capsule daily.    [name] NORTREL 1/35, 21, tablet Take 1 tablet by mouth daily.     . Probiotic Product (TRUBIOTICS PO) Take by mouth daily.     No current facility-administered medications on file prior to visit.    Allergies  Allergen Reactions  . Penicillins Shortness Of Breath    Past Medical History  Diagnosis Date  . Allergy   .  Endometriosis     Followed by Lynnell Chad- on Nortrel    Past Surgical History  Procedure Laterality Date  . Appendectomy    . Tonsillectomy  1994  . Ovarian cyst surgery    . Nasal sinus surgery      Family History  Problem Relation Age of Onset  . COPD Mother   . Hypertension Mother   . Alzheimer's disease Father   . Hypertension Father     Social History   Social History  . Marital Status: Single    Spouse Name: N/A  . Number of Children: N/A  . Years of Education: N/A   Occupational History  . Not on file.   Social History Main Topics  . Smoking status: Never Smoker   . Smokeless tobacco: Never Used  . Alcohol Use: 0.0 oz/week    0 Standard drinks or equivalent per week  . Drug Use: No  . Sexual Activity: Not on file   Other Topics Concern  . Not on file   Social History Narrative   Works at [employer].  Recently moved here from [place].   G0   The PMH, PSH, Social History, Family History, Medications, and allergies have been reviewed in [facility], and have been updated if relevant.   Review of Systems  Constitutional: Negative.   Eyes: Negative.   Respiratory:  Negative.   Cardiovascular: Negative.   Musculoskeletal: Negative.   Skin: Negative.   Neurological: Negative.   Hematological: Negative.   Psychiatric/Behavioral: Negative.   All other systems reviewed and are negative.      Objective:    BP 146/98 mmHg  Pulse 68  Temp(Src) 98.4 F (36.9 C) (Oral)  Ht 5\' 5"  (1.651 m)  Wt 211 lb 1.9 oz (95.763 kg)  BMI 35.13 kg/m2  SpO2 99%  LMP 04/09/2015 Wt Readings from Last 3 Encounters:  04/12/15 211 lb 1.9 oz (95.763 kg)  04/11/15 212 lb 12 oz (96.503 kg)  03/27/15 217 lb 8 oz (98.657 kg)     Physical Exam  Constitutional: She is oriented to person, place, and time. She appears well-developed and well-nourished. No distress.  HENT:  Head: Normocephalic and atraumatic.  Eyes: Conjunctivae are normal.  Cardiovascular: Normal rate and  regular rhythm.   Pulmonary/Chest: Effort normal.  Musculoskeletal: Normal range of motion.  Neurological: She is alert and oriented to person, place, and time. No cranial nerve deficit.  Skin: Skin is warm and dry. She is not diaphoretic.  Psychiatric: Her mood appears anxious.  Nursing note and vitals reviewed.         Assessment & Plan:   Essential hypertension  Cardiomegaly  HTN, white coat  Obesity No Follow-up on file.

## 2015-04-12 NOTE — Progress Notes (Signed)
Pre visit review using our clinic review tool, if applicable. No additional management support is needed unless otherwise documented below in the visit note. 

## 2015-04-12 NOTE — Assessment & Plan Note (Signed)
Continue current rxs. She will keep monitoring her blood pressure and keep me updated. As she continues to lose weight, will likely need to decrease dose of her antihypertensive. The patient indicates understanding of these issues and agrees with the plan.

## 2015-04-13 ENCOUNTER — Ambulatory Visit: Payer: Managed Care, Other (non HMO) | Admitting: Family Medicine

## 2015-04-17 ENCOUNTER — Encounter: Payer: Self-pay | Admitting: Family Medicine

## 2015-04-17 ENCOUNTER — Other Ambulatory Visit: Payer: Self-pay | Admitting: Family Medicine

## 2015-04-17 MED ORDER — LISINOPRIL 10 MG PO TABS
10.0000 mg | ORAL_TABLET | Freq: Every day | ORAL | Status: DC
Start: 2015-04-17 — End: 2015-07-12

## 2015-04-18 ENCOUNTER — Ambulatory Visit: Payer: Managed Care, Other (non HMO) | Admitting: Cardiovascular Disease

## 2015-04-24 ENCOUNTER — Encounter: Payer: Self-pay | Admitting: Family Medicine

## 2015-05-08 ENCOUNTER — Other Ambulatory Visit: Payer: Self-pay | Admitting: Family Medicine

## 2015-05-08 ENCOUNTER — Encounter: Payer: Self-pay | Admitting: Family Medicine

## 2015-05-08 MED ORDER — BENZONATATE 200 MG PO CAPS: 200.0000 mg | ORAL_CAPSULE | Freq: Two times a day (BID) | ORAL | Status: DC | PRN

## 2015-05-10 ENCOUNTER — Ambulatory Visit: Payer: Managed Care, Other (non HMO) | Admitting: Family Medicine

## 2015-05-10 ENCOUNTER — Ambulatory Visit (INDEPENDENT_AMBULATORY_CARE_PROVIDER_SITE_OTHER): Payer: Managed Care, Other (non HMO) | Admitting: Family Medicine

## 2015-05-10 ENCOUNTER — Encounter: Payer: Self-pay | Admitting: Family Medicine

## 2015-05-10 VITALS — BP 146/90 | HR 75 | Temp 97.5°F | Resp 12 | Wt 210.0 lb

## 2015-05-10 DIAGNOSIS — R05 Cough: Secondary | ICD-10-CM | POA: Diagnosis not present

## 2015-05-10 DIAGNOSIS — R059 Cough, unspecified: Secondary | ICD-10-CM

## 2015-05-10 MED ORDER — ALBUTEROL SULFATE HFA 108 (90 BASE) MCG/ACT IN AERS: 2.0000 | INHALATION_SPRAY | Freq: Four times a day (QID) | RESPIRATORY_TRACT | Status: DC | PRN

## 2015-05-10 MED ORDER — HYDROCOD POLST-CPM POLST ER 10-8 MG/5ML PO SUER: 5.0000 mL | Freq: Two times a day (BID) | ORAL | Status: DC | PRN
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
## 2015-05-10 NOTE — Patient Instructions (Signed)
Good to see you. Use inhaler as directed.  Keep me updated.

## 2015-05-10 NOTE — Progress Notes (Signed)
Pre visit review using our clinic review tool, if applicable. No additional management support is needed unless otherwise documented below in the visit note. 

## 2015-05-15 ENCOUNTER — Encounter: Payer: Self-pay | Admitting: Family Medicine

## 2015-05-17 ENCOUNTER — Ambulatory Visit (INDEPENDENT_AMBULATORY_CARE_PROVIDER_SITE_OTHER): Payer: Managed Care, Other (non HMO) | Admitting: Family Medicine

## 2015-05-17 ENCOUNTER — Encounter: Payer: Self-pay | Admitting: Family Medicine

## 2015-05-17 VITALS — BP 146/102 | HR 84 | Temp 99.3°F | Wt 211.2 lb

## 2015-05-17 DIAGNOSIS — R05 Cough: Secondary | ICD-10-CM | POA: Diagnosis not present

## 2015-05-17 DIAGNOSIS — R059 Cough, unspecified: Secondary | ICD-10-CM | POA: Insufficient documentation

## 2015-05-17 NOTE — Patient Instructions (Signed)
  Try over the counter nasocort-start with 2 sprays per nostril per day...and then try to taper to 1 spray per nostril once symptoms improve.    

## 2015-05-17 NOTE — Assessment & Plan Note (Signed)
Persistent. Lungs clear today. Likely allergic.  Advised tried of another antihistamine, add Nasocort. Call or return to clinic prn if these symptoms worsen or fail to improve as anticipated. The patient indicates understanding of these issues and agrees with the plan.

## 2015-05-17 NOTE — Progress Notes (Signed)
Pre visit review using our clinic review tool, if applicable. No additional management support is needed unless otherwise documented below in the visit note. 
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
## 2015-05-18 ENCOUNTER — Other Ambulatory Visit: Payer: Self-pay | Admitting: Family Medicine

## 2015-05-18 DIAGNOSIS — J302 Other seasonal allergic rhinitis: Secondary | ICD-10-CM

## 2015-06-23 ENCOUNTER — Encounter: Payer: Self-pay | Admitting: Family Medicine

## 2015-07-11 ENCOUNTER — Encounter: Payer: Self-pay | Admitting: Family Medicine

## 2015-07-12 ENCOUNTER — Other Ambulatory Visit: Payer: Self-pay | Admitting: *Deleted

## 2015-07-12 MED ORDER — LISINOPRIL 10 MG PO TABS: 10.0000 mg | ORAL_TABLET | Freq: Every day | ORAL | Status: DC

## 2015-07-12 NOTE — Telephone Encounter (Signed)
Pt changed to mail order pharmacy. Rx sent to requested pharmacy

## 2015-07-13 ENCOUNTER — Ambulatory Visit (INDEPENDENT_AMBULATORY_CARE_PROVIDER_SITE_OTHER): Payer: Managed Care, Other (non HMO) | Admitting: Family Medicine

## 2015-07-13 ENCOUNTER — Encounter: Payer: Self-pay | Admitting: Family Medicine

## 2015-07-13 VITALS — BP 132/98 | HR 74 | Temp 98.8°F | Ht 65.5 in | Wt 208.2 lb

## 2015-07-13 DIAGNOSIS — I10 Essential (primary) hypertension: Secondary | ICD-10-CM | POA: Diagnosis not present

## 2015-07-13 DIAGNOSIS — Z01419 Encounter for gynecological examination (general) (routine) without abnormal findings: Secondary | ICD-10-CM

## 2015-07-13 DIAGNOSIS — E669 Obesity, unspecified: Secondary | ICD-10-CM

## 2015-07-13 DIAGNOSIS — R030 Elevated blood-pressure reading, without diagnosis of hypertension: Secondary | ICD-10-CM

## 2015-07-13 DIAGNOSIS — IMO0001 Reserved for inherently not codable concepts without codable children: Secondary | ICD-10-CM

## 2015-07-13 DIAGNOSIS — Z0000 Encounter for general adult medical examination without abnormal findings: Secondary | ICD-10-CM | POA: Diagnosis not present

## 2015-07-13 MED ORDER — LISINOPRIL 10 MG PO TABS: 10.0000 mg | ORAL_TABLET | Freq: Every day | ORAL | Status: DC

## 2015-07-13 NOTE — Progress Notes (Signed)
Subjective:   Patient ID: Ruth Elliott, female    DOB: 1970/01/25, 45 y.o.   MRN: [id number]  Ruth Elliott is a pleasant 45 y.o. year old female who presents to clinic today with Annual Exam  on 07/13/2015  HPI:  Has a GYN, last pap smear was in 03/2015 Mammogram 03/2015  HTN- H/o white coat HTN but BP has been slowly trending up so we started her on rx earlier this month.  Currently taking HCTZ 12.5- Lisinopril 10 mg daily. Denies any side effects.  I referred her to cardiology for chest pressure and cardiomegaly on CXR for work up/echo. Was seen yesterday to review results of echo- note reviewed- saw Wende Bushy, MD.  LVH on echo- moderate concentric hypertrophy.  Advised repeat echo in 2 years.  Continues to work on diet and exercise and has been losing weight.  Checks her BP routinely at home twice daily and mainly ranging in 120s/70s.  Lab Results  Component Value Date   CHOL 115 08/10/2014   HDL 38* 08/10/2014   LDLCALC 59 08/10/2014   TRIG 91 08/10/2014   CHOLHDL 3.0 08/10/2014   Lab Results  Component Value Date   CREATININE 0.60 08/10/2014   Lab Results  Component Value Date   TSH 0.818 08/10/2014   Lab Results  Component Value Date   WBC 10.5 08/10/2014   HCT 42.3 08/10/2014   MCV 97 08/10/2014   PLT 175 08/10/2014   Lab Results  Component Value Date   ALT 15 08/10/2014   AST 31 08/10/2014   ALKPHOS 49 08/10/2014   BILITOT 0.8 08/10/2014   Lab Results  Component Value Date   NA 137 08/10/2014   K 4.3 08/10/2014   CL 96* 08/10/2014   CO2 17* 08/10/2014     Wt Readings from Last 3 Encounters:  07/13/15 208 lb 4 oz (94.462 kg)  05/17/15 211 lb 4 oz (95.822 kg)  05/10/15 210 lb (95.255 kg)     Current Outpatient Prescriptions on File Prior to Visit  Medication Sig Dispense Refill  . B Complex Vitamins (B COMPLEX PO) Take by mouth.    . Flax Oil-Fish Oil-Borage Oil (FISH OIL-FLAX OIL-BORAGE OIL) CAPS Take by mouth.    . Multiple  Vitamin (MULTIVITAMIN) tablet Take 1 tablet by mouth daily.    . norethindrone-ethinyl estradiol (MICROGESTIN,JUNEL,LOESTRIN) 1-20 MG-MCG tablet Take 1 tablet by mouth daily.  0   No current facility-administered medications on file prior to visit.    Allergies  Allergen Reactions  . Penicillins Shortness Of Breath    Past Medical History  Diagnosis Date  . Allergy   . Endometriosis     Followed by Lynnell Chad- on Nortrel    Past Surgical History  Procedure Laterality Date  . Appendectomy    . Tonsillectomy  1994  . Ovarian cyst surgery    . Nasal sinus surgery      Family History  Problem Relation Age of Onset  . COPD Mother   . Hypertension Mother   . Alzheimer's disease Father   . Hypertension Father     Social History   Social History  . Marital Status: Single    Spouse Name: N/A  . Number of Children: N/A  . Years of Education: N/A   Occupational History  . Not on file.   Social History Main Topics  . Smoking status: Never Smoker   . Smokeless tobacco: Never Used  . Alcohol Use: 0.0 oz/week  0 Standard drinks or equivalent per week  . Drug Use: No  . Sexual Activity: Not on file   Other Topics Concern  . Not on file   Social History Narrative   Works at [employer].  Recently moved here from [place].   G0   The PMH, PSH, Social History, Family History, Medications, and allergies have been reviewed in [facility], and have been updated if relevant.   Review of Systems  Constitutional: Negative.   Eyes: Negative.   Respiratory: Negative.   Cardiovascular: Negative.   Musculoskeletal: Negative.   Skin: Negative.   Neurological: Negative.   Hematological: Negative.   Psychiatric/Behavioral: Negative.   All other systems reviewed and are negative.      Objective:    BP 132/98 mmHg  Pulse 74  Temp(Src) 98.8 F (37.1 C) (Oral)  Ht 5' 5.5" (1.664 m)  Wt 208 lb 4 oz (94.462 kg)  BMI 34.12 kg/m2  SpO2 98% Wt Readings from Last 3 Encounters:   07/13/15 208 lb 4 oz (94.462 kg)  05/17/15 211 lb 4 oz (95.822 kg)  05/10/15 210 lb (95.255 kg)     Physical Exam  Constitutional: She is oriented to person, place, and time. She appears well-developed and well-nourished. No distress.  HENT:  Head: Normocephalic and atraumatic.  Eyes: Conjunctivae are normal.  Cardiovascular: Normal rate and regular rhythm.   Pulmonary/Chest: Effort normal.  Musculoskeletal: Normal range of motion.  Neurological: She is alert and oriented to person, place, and time. No cranial nerve deficit.  Skin: Skin is warm and dry. She is not diaphoretic.  Psychiatric: Her mood appears anxious.  Nursing note and vitals reviewed.         Assessment & Plan:   Essential hypertension  Obesity  HTN, white coat  Well woman exam No Follow-up on file.

## 2015-07-13 NOTE — Progress Notes (Signed)
Pre visit review using our clinic review tool, if applicable. No additional management support is needed unless otherwise documented below in the visit note. 

## 2015-07-13 NOTE — Assessment & Plan Note (Signed)
Reviewed preventive care protocols, scheduled due services, and updated immunizations Discussed nutrition, exercise, diet, and healthy lifestyle.  Orders Placed This Encounter  Procedures  . CBC with Differential/Platelet  . Comprehensive metabolic panel  . Lipid panel  . TSH  . Hemoglobin A1c    

## 2015-07-13 NOTE — Assessment & Plan Note (Signed)
Well controlled. No changes made to rxs. 

## 2015-07-13 NOTE — Addendum Note (Signed)
Addended by: Ellamae Sia on: 07/13/2015 12:48 PM   Modules accepted: Miquel Dunn

## 2015-07-13 NOTE — Patient Instructions (Signed)
Great to see you. We will call you with your results and you can view them  Online.

## 2015-07-20 ENCOUNTER — Encounter: Payer: Managed Care, Other (non HMO) | Admitting: Family Medicine

## 2015-07-25 ENCOUNTER — Encounter: Payer: Self-pay | Admitting: Family Medicine

## 2015-07-26 ENCOUNTER — Other Ambulatory Visit: Payer: Self-pay | Admitting: Family Medicine

## 2015-07-26 DIAGNOSIS — Z01419 Encounter for gynecological examination (general) (routine) without abnormal findings: Secondary | ICD-10-CM

## 2015-07-31 ENCOUNTER — Encounter: Payer: Self-pay | Admitting: Family Medicine

## 2015-07-31 ENCOUNTER — Other Ambulatory Visit: Payer: Managed Care, Other (non HMO)

## 2015-07-31 ENCOUNTER — Ambulatory Visit (INDEPENDENT_AMBULATORY_CARE_PROVIDER_SITE_OTHER): Payer: Managed Care, Other (non HMO) | Admitting: Family Medicine

## 2015-07-31 VITALS — BP 134/78 | HR 66 | Temp 98.7°F | Wt 208.5 lb

## 2015-07-31 DIAGNOSIS — R5383 Other fatigue: Secondary | ICD-10-CM

## 2015-07-31 DIAGNOSIS — R519 Headache, unspecified: Secondary | ICD-10-CM | POA: Insufficient documentation

## 2015-07-31 DIAGNOSIS — R51 Headache: Secondary | ICD-10-CM | POA: Diagnosis not present

## 2015-07-31 DIAGNOSIS — Z01419 Encounter for gynecological examination (general) (routine) without abnormal findings: Secondary | ICD-10-CM

## 2015-07-31 MED ORDER — AZITHROMYCIN 250 MG PO TABS: ORAL_TABLET | ORAL | Status: DC

## 2015-07-31 NOTE — Progress Notes (Signed)
Pre visit review using our clinic review tool, if applicable. No additional management support is needed unless otherwise documented below in the visit note. 

## 2015-07-31 NOTE — Progress Notes (Signed)
SUBJECTIVE:  Ruth Elliott is a 45 y.o. female who complains of coryza, congestion, headache and bilateral sinus pain for 10 days. She denies a history of anorexia and chest pain and denies a history of asthma. Patient denies smoke cigarettes.   Current Outpatient Prescriptions on File Prior to Visit  Medication Sig Dispense Refill  . B Complex Vitamins (B COMPLEX PO) Take by mouth.    . Flax Oil-Fish Oil-Borage Oil (FISH OIL-FLAX OIL-BORAGE OIL) CAPS Take by mouth.    [name] lisinopril (PRINIVIL,ZESTRIL) 10 MG tablet Take 1 tablet (10 mg total) by mouth daily. 90 tablet 3  . Multiple Vitamin (MULTIVITAMIN) tablet Take 1 tablet by mouth daily.    . norethindrone-ethinyl estradiol (MICROGESTIN,JUNEL,LOESTRIN) 1-20 MG-MCG tablet Take 1 tablet by mouth daily.  0   No current facility-administered medications on file prior to visit.    Allergies  Allergen Reactions  . Penicillins Shortness Of Breath    Past Medical History  Diagnosis Date  . Allergy   . Endometriosis     Followed by Lynnell Chad- on Nortrel    Past Surgical History  Procedure Laterality Date  . Appendectomy    . Tonsillectomy  1994  . Ovarian cyst surgery    . Nasal sinus surgery      Family History  Problem Relation Age of Onset  . COPD Mother   . Hypertension Mother   . Alzheimer's disease Father   . Hypertension Father     Social History   Social History  . Marital Status: Single    Spouse Name: N/A  . Number of Children: N/A  . Years of Education: N/A   Occupational History  . Not on file.   Social History Main Topics  . Smoking status: Never Smoker   . Smokeless tobacco: Never Used  . Alcohol Use: 0.0 oz/week    0 Standard drinks or equivalent per week  . Drug Use: No  . Sexual Activity: Not on file   Other Topics Concern  . Not on file   Social History Narrative   Works at [employer].  Recently moved here from [place].   G0   The PMH, PSH, Social History, Family History,  Medications, and allergies have been reviewed in [facility], and have been updated if relevant.  OBJECTIVE: BP 134/78 mmHg  Pulse 66  Temp(Src) 98.7 F (37.1 C) (Oral)  Wt 208 lb 8 oz (94.575 kg)  SpO2 99%  She appears well, vital signs are as noted. Ears normal.  Throat and pharynx normal.  Neck supple. No adenopathy in the neck. Nose is congested. Sinuses tender. The chest is clear, without wheezes or rales.  ASSESSMENT:  sinusitis  PLAN: Given duration and progression of symptoms, will treat for bacterial sinusitis. PCN allergic- eRx sent for zpack. Symptomatic therapy suggested: push fluids, rest and return office visit prn if symptoms persist or worsen. Lack of antibiotic effectiveness discussed with her. Call or return to clinic prn if these symptoms worsen or fail to improve as anticipated.

## 2015-07-31 NOTE — Progress Notes (Deleted)
   Subjective:   Patient ID: Ruth Elliott, female    DOB: September 01, 1970, 45 y.o.   MRN: [id number]  Ruth Elliott is a pleasant 45 y.o. year old female who presents to clinic today with Fatigue and Headache  on 07/31/2015  HPI:   Lab Results  Component Value Date   WBC 10.6 07/13/2015   HCT 40.6 07/13/2015   MCV 99* 07/13/2015   PLT 199 07/13/2015   Lab Results  Component Value Date   NA 137 08/10/2014   K 4.3 08/10/2014   CL 96* 08/10/2014   CO2 17* 08/10/2014   Lab Results  Component Value Date   VITAMINB12 358 08/15/2014   Lab Results  Component Value Date   ALT 15 08/10/2014   AST 31 08/10/2014   ALKPHOS 49 08/10/2014   BILITOT 0.8 08/10/2014   Lab Results  Component Value Date   TSH 1.210 07/13/2015   Current Outpatient Prescriptions on File Prior to Visit  Medication Sig Dispense Refill  . B Complex Vitamins (B COMPLEX PO) Take by mouth.    . Flax Oil-Fish Oil-Borage Oil (FISH OIL-FLAX OIL-BORAGE OIL) CAPS Take by mouth.    [name] lisinopril (PRINIVIL,ZESTRIL) 10 MG tablet Take 1 tablet (10 mg total) by mouth daily. 90 tablet 3  . Multiple Vitamin (MULTIVITAMIN) tablet Take 1 tablet by mouth daily.    . norethindrone-ethinyl estradiol (MICROGESTIN,JUNEL,LOESTRIN) 1-20 MG-MCG tablet Take 1 tablet by mouth daily.  0   No current facility-administered medications on file prior to visit.    Allergies  Allergen Reactions  . Penicillins Shortness Of Breath    Past Medical History  Diagnosis Date  . Allergy   . Endometriosis     Followed by Lynnell Chad- on Nortrel    Past Surgical History  Procedure Laterality Date  . Appendectomy    . Tonsillectomy  1994  . Ovarian cyst surgery    . Nasal sinus surgery      Family History  Problem Relation Age of Onset  . COPD Mother   . Hypertension Mother   . Alzheimer's disease Father   . Hypertension Father     Social History   Social History  . Marital Status: Single    Spouse Name: N/A  . Number  of Children: N/A  . Years of Education: N/A   Occupational History  . Not on file.   Social History Main Topics  . Smoking status: Never Smoker   . Smokeless tobacco: Never Used  . Alcohol Use: 0.0 oz/week    0 Standard drinks or equivalent per week  . Drug Use: No  . Sexual Activity: Not on file   Other Topics Concern  . Not on file   Social History Narrative   Works at [employer].  Recently moved here from [place].   G0   The PMH, PSH, Social History, Family History, Medications, and allergies have been reviewed in [facility], and have been updated if relevant.   Review of Systems     Objective:    There were no vitals taken for this visit.   Physical Exam        Assessment & Plan:   No diagnosis found. No Follow-up on file.

## 2015-08-03 LAB — LIPID PANEL

## 2015-08-03 LAB — CBC WITH DIFFERENTIAL/PLATELET
BASOS ABS: 0 10*3/uL (ref 0.0–0.2)
Basos: 0 %
EOS (ABSOLUTE): 0.2 10*3/uL (ref 0.0–0.4)
Eos: 1 %
HEMATOCRIT: 40.6 % (ref 34.0–46.6)
HEMOGLOBIN: 13.4 g/dL (ref 11.1–15.9)
IMMATURE GRANS (ABS): 0.1 10*3/uL (ref 0.0–0.1)
IMMATURE GRANULOCYTES: 1 %
LYMPHS ABS: 3.2 10*3/uL — AB (ref 0.7–3.1)
Lymphs: 30 %
MCH: 32.7 pg (ref 26.6–33.0)
MCHC: 33 g/dL (ref 31.5–35.7)
MCV: 99 fL — ABNORMAL HIGH (ref 79–97)
MONOCYTES: 6 %
Monocytes Absolute: 0.7 10*3/uL (ref 0.1–0.9)
NEUTROS ABS: 6.5 10*3/uL (ref 1.4–7.0)
Neutrophils: 62 %
Platelets: 199 10*3/uL (ref 150–379)
RBC: 4.1 x10E6/uL (ref 3.77–5.28)
RDW: 12.8 % (ref 12.3–15.4)
WBC: 10.6 10*3/uL (ref 3.4–10.8)

## 2015-08-03 LAB — COMPREHENSIVE METABOLIC PANEL

## 2015-08-03 LAB — HEMOGLOBIN A1C
Est. average glucose Bld gHb Est-mCnc: 97 mg/dL
Hgb A1c MFr Bld: 5 % (ref 4.8–5.6)

## 2015-08-03 LAB — TSH: TSH: 1.21 u[IU]/mL (ref 0.450–4.500)

## 2015-08-04 LAB — COMPREHENSIVE METABOLIC PANEL
ALBUMIN: 4.1 g/dL (ref 3.5–5.5)
ALK PHOS: 48 IU/L (ref 39–117)
ALT: 10 IU/L (ref 0–32)
AST: 17 IU/L (ref 0–40)
Albumin/Globulin Ratio: 1.8 (ref 1.2–2.2)
BILIRUBIN TOTAL: 0.7 mg/dL (ref 0.0–1.2)
BUN / CREAT RATIO: 17 (ref 9–23)
BUN: 14 mg/dL (ref 6–24)
CHLORIDE: 100 mmol/L (ref 96–106)
CO2: 21 mmol/L (ref 18–29)
Calcium: 8.8 mg/dL (ref 8.7–10.2)
Creatinine, Ser: 0.84 mg/dL (ref 0.57–1.00)
GFR calc Af Amer: 97 mL/min/{1.73_m2} (ref >59)
GFR calc non Af Amer: 84 mL/min/{1.73_m2} (ref >59)
GLUCOSE: 90 mg/dL (ref 65–99)
Globulin, Total: 2.3 g/dL (ref 1.5–4.5)
Potassium: 4.8 mmol/L (ref 3.5–5.2)
Sodium: 137 mmol/L (ref 134–144)
Total Protein: 6.4 g/dL (ref 6.0–8.5)

## 2015-08-04 LAB — NICOTINE/COTININE METABOLITES
COTININE: NOT DETECTED ng/mL
Nicotine: NOT DETECTED ng/mL

## 2015-08-04 LAB — LIPID PANEL
CHOLESTEROL TOTAL: 102 mg/dL (ref 100–199)
Chol/HDL Ratio: 2.7 ratio units (ref 0.0–4.4)
HDL: 38 mg/dL — ABNORMAL LOW (ref >39)
LDL Calculated: 48 mg/dL (ref 0–99)
Triglycerides: 81 mg/dL (ref 0–149)
VLDL CHOLESTEROL CAL: 16 mg/dL (ref 5–40)

## 2015-08-08 ENCOUNTER — Encounter: Payer: Self-pay | Admitting: Family Medicine

## 2015-09-11 ENCOUNTER — Ambulatory Visit (INDEPENDENT_AMBULATORY_CARE_PROVIDER_SITE_OTHER): Payer: Managed Care, Other (non HMO) | Admitting: Family Medicine

## 2015-09-11 ENCOUNTER — Encounter: Payer: Self-pay | Admitting: Family Medicine

## 2015-09-11 VITALS — BP 140/90 | HR 64 | Temp 98.6°F | Wt 216.2 lb

## 2015-09-11 DIAGNOSIS — I10 Essential (primary) hypertension: Secondary | ICD-10-CM

## 2015-09-11 DIAGNOSIS — E669 Obesity, unspecified: Secondary | ICD-10-CM | POA: Diagnosis not present

## 2015-09-11 NOTE — Assessment & Plan Note (Signed)
Well controlled at home. No changes made.

## 2015-09-11 NOTE — Progress Notes (Signed)
Pre visit review using our clinic review tool, if applicable. No additional management support is needed unless otherwise documented below in the visit note. 

## 2015-09-11 NOTE — Assessment & Plan Note (Signed)
Deteriorated. She is starting back on her diet and exercise routine.

## 2015-09-11 NOTE — Progress Notes (Signed)
Subjective:   Patient ID: Ruth Elliott, female    DOB: 1970/01/24, 45 y.o.   MRN: [id number]  Ruth Elliott is a pleasant 45 y.o. year old female who presents to clinic today with Follow-up (BP)  on 09/11/2015  HPI:  Here for BP follow up.   H/o white coat HTN but BP had been slowly trending up so we started her on rx months ago.  Currently taking  Lisinopril 10 mg daily. Denies any side effects.  Lab Results  Component Value Date   CREATININE 0.84 07/31/2015    I referred her to cardiology for chest pressure and cardiomegaly on CXR for work up/echo. Was seen yesterday to review results of echo- note reviewed- saw Wende Bushy, MD.  LVH on echo- moderate concentric hypertrophy.  Advised repeat echo in 2 years.  Continues to work on diet and exercise and has been losing weight.  Checks her BP routinely at home twice daily and mainly ranging in 115- 120s/70s.  Still doing boot camp- working on diet as well.  Gained weight recently because she was on vacation.  Wt Readings from Last 3 Encounters:  09/11/15 216 lb 4 oz (98.1 kg)  07/31/15 208 lb 8 oz (94.6 kg)  07/13/15 208 lb 4 oz (94.5 kg)     Current Outpatient Prescriptions on File Prior to Visit  Medication Sig Dispense Refill  . B Complex Vitamins (B COMPLEX PO) Take by mouth.    . Flax Oil-Fish Oil-Borage Oil (FISH OIL-FLAX OIL-BORAGE OIL) CAPS Take by mouth.    [name] lisinopril (PRINIVIL,ZESTRIL) 10 MG tablet Take 1 tablet (10 mg total) by mouth daily. 90 tablet 3  . Multiple Vitamin (MULTIVITAMIN) tablet Take 1 tablet by mouth daily.    . norethindrone-ethinyl estradiol (MICROGESTIN,JUNEL,LOESTRIN) 1-20 MG-MCG tablet Take 1 tablet by mouth daily.  0   No current facility-administered medications on file prior to visit.     Allergies  Allergen Reactions  . Penicillins Shortness Of Breath    Past Medical History:  Diagnosis Date  . Allergy   . Endometriosis    Followed by Lynnell Chad- on  Nortrel    Past Surgical History:  Procedure Laterality Date  . APPENDECTOMY    . NASAL SINUS SURGERY    . OVARIAN CYST SURGERY    . TONSILLECTOMY  1994    Family History  Problem Relation Age of Onset  . COPD Mother   . Hypertension Mother   . Alzheimer's disease Father   . Hypertension Father     Social History   Social History  . Marital status: Single    Spouse name: N/A  . Number of children: N/A  . Years of education: N/A   Occupational History  . Not on file.   Social History Main Topics  . Smoking status: Never Smoker  . Smokeless tobacco: Never Used  . Alcohol use 0.0 oz/week  . Drug use: No  . Sexual activity: Not on file   Other Topics Concern  . Not on file   Social History Narrative   Works at [employer].  Recently moved here from [place].   G0   The PMH, PSH, Social History, Family History, Medications, and allergies have been reviewed in [facility], and have been updated if relevant.  Review of Systems  Constitutional: Negative.   Eyes: Negative.   Respiratory: Negative.   Cardiovascular: Negative.   Musculoskeletal: Negative.   Neurological: Negative.   Hematological: Negative.   Psychiatric/Behavioral: Negative.   All  other systems reviewed and are negative.      Objective:    BP 140/90   Pulse 64   Temp 98.6 F (37 C) (Oral)   Wt 216 lb 4 oz (98.1 kg)   SpO2 98%   BMI 35.44 kg/m  BP Readings from Last 3 Encounters:  09/11/15 140/90  07/31/15 134/78  07/13/15 (!) 132/98     Physical Exam  Constitutional: She is oriented to person, place, and time. She appears well-developed and well-nourished. No distress.  HENT:  Head: Normocephalic and atraumatic.  Eyes: Conjunctivae are normal.  Cardiovascular: Normal rate and regular rhythm.   Pulmonary/Chest: Effort normal and breath sounds normal.  Musculoskeletal: Normal range of motion.  Neurological: She is alert and oriented to person, place, and time. No cranial nerve deficit.    Skin: Skin is warm and dry. She is not diaphoretic.  Psychiatric: She has a normal mood and affect. Her behavior is normal. Judgment and thought content normal.  Nursing note and vitals reviewed.         Assessment & Plan:   Essential hypertension  Obesity No Follow-up on file.

## 2015-09-25 ENCOUNTER — Telehealth: Payer: Self-pay | Admitting: *Deleted

## 2015-09-25 NOTE — Telephone Encounter (Signed)
Spoke to pt and informed her form is available for pickup from the front desk 

## 2015-09-25 NOTE — Telephone Encounter (Signed)
Signed and in Lenora box for completion.

## 2015-09-25 NOTE — Telephone Encounter (Signed)
PT brought in a new Health Screenings Appeal Ruth Elliott stated the other one was the wrong one and needs this one filled out. Please call her when it is ready [phone]. Form placed in prescription tower.

## 2015-12-18 ENCOUNTER — Encounter: Payer: Self-pay | Admitting: Family Medicine

## 2015-12-18 ENCOUNTER — Ambulatory Visit (INDEPENDENT_AMBULATORY_CARE_PROVIDER_SITE_OTHER): Payer: Managed Care, Other (non HMO) | Admitting: Family Medicine

## 2015-12-18 VITALS — BP 142/80 | HR 66 | Temp 98.5°F | Wt 220.0 lb

## 2015-12-18 DIAGNOSIS — J0110 Acute frontal sinusitis, unspecified: Secondary | ICD-10-CM | POA: Diagnosis not present

## 2015-12-18 MED ORDER — AZITHROMYCIN 250 MG PO TABS: ORAL_TABLET | ORAL | 0 refills | Status: DC

## 2015-12-18 NOTE — Progress Notes (Signed)
SUBJECTIVE:  Chirstina L Elliott is a 45 y.o. female who complains of coryza, congestion, sneezing, sore throat, dry cough and bilateral sinus pain for 9 days. She denies a history of anorexia and chest pain and admits to a history of asthma. Patient denies smoke cigarettes.   Current Outpatient Prescriptions on File Prior to Visit  Medication Sig Dispense Refill  . B Complex Vitamins (B COMPLEX PO) Take by mouth.    . Flax Oil-Fish Oil-Borage Oil (FISH OIL-FLAX OIL-BORAGE OIL) CAPS Take by mouth.    [name] lisinopril (PRINIVIL,ZESTRIL) 10 MG tablet Take 1 tablet (10 mg total) by mouth daily. 90 tablet 3  . Multiple Vitamin (MULTIVITAMIN) tablet Take 1 tablet by mouth daily.    . norethindrone-ethinyl estradiol (MICROGESTIN,JUNEL,LOESTRIN) 1-20 MG-MCG tablet Take 1 tablet by mouth daily.  0   No current facility-administered medications on file prior to visit.     Allergies  Allergen Reactions  . Penicillins Shortness Of Breath    Past Medical History:  Diagnosis Date  . Allergy   . Endometriosis    Followed by Lynnell Chad- on Nortrel    Past Surgical History:  Procedure Laterality Date  . APPENDECTOMY    . NASAL SINUS SURGERY    . OVARIAN CYST SURGERY    . TONSILLECTOMY  1994    Family History  Problem Relation Age of Onset  . COPD Mother   . Hypertension Mother   . Alzheimer's disease Father   . Hypertension Father     Social History   Social History  . Marital status: Single    Spouse name: N/A  . Number of children: N/A  . Years of education: N/A   Occupational History  . Not on file.   Social History Main Topics  . Smoking status: Never Smoker  . Smokeless tobacco: Never Used  . Alcohol use 0.0 oz/week  . Drug use: No  . Sexual activity: Not on file   Other Topics Concern  . Not on file   Social History Narrative   Works at [employer].  Recently moved here from [place].   G0   The PMH, PSH, Social History, Family History, Medications, and allergies  have been reviewed in [facility], and have been updated if relevant.  OBJECTIVE: BP (!) 142/80   Pulse 66   Temp 98.5 F (36.9 C) (Oral)   Wt 220 lb (99.8 kg)   SpO2 97%   BMI 36.05 kg/m   She appears well, vital signs are as noted. Ears normal.  Throat and pharynx normal.  Neck supple. No adenopathy in the neck. Nose is congested. Sinuses tender. The chest is clear, without wheezes or rales.  ASSESSMENT:  sinusitis  PLAN: Given duration and progression of symptoms, will treat for bacterial sinusitis with zpack (PCN allergic).  Symptomatic therapy suggested: push fluids, rest and return office visit prn if symptoms persist or worsen.  Call or return to clinic prn if these symptoms worsen or fail to improve as anticipated.

## 2015-12-18 NOTE — Progress Notes (Signed)
Pre visit review using our clinic review tool, if applicable. No additional management support is needed unless otherwise documented below in the visit note. 

## 2015-12-18 NOTE — Patient Instructions (Signed)
Great to see you.  Please take zpack as directed.  Keep me updated.

## 2015-12-28 ENCOUNTER — Encounter: Payer: Self-pay | Admitting: Family Medicine

## 2015-12-28 ENCOUNTER — Ambulatory Visit (INDEPENDENT_AMBULATORY_CARE_PROVIDER_SITE_OTHER): Payer: Managed Care, Other (non HMO) | Admitting: Family Medicine

## 2015-12-28 DIAGNOSIS — S6991XA Unspecified injury of right wrist, hand and finger(s), initial encounter: Secondary | ICD-10-CM | POA: Diagnosis not present

## 2015-12-28 DIAGNOSIS — M79641 Pain in right hand: Secondary | ICD-10-CM | POA: Diagnosis not present

## 2015-12-28 NOTE — Progress Notes (Signed)
Pre visit review using our clinic review tool, if applicable. No additional management support is needed unless otherwise documented below in the visit note. 

## 2015-12-28 NOTE — Assessment & Plan Note (Signed)
New- likely finger sprains.  Exam reassuring, normal grip strength without swelling or bony tenderness. Given exercises from sports med advisor. Call or return to clinic prn if these symptoms worsen or fail to improve as anticipated. The patient indicates understanding of these issues and agrees with the plan.

## 2015-12-28 NOTE — Progress Notes (Signed)
Subjective:   Patient ID: Ruth Elliott, female    DOB: [birth year], 45 y.o.   MRN: [id number]  PETRONILA HARES is a pleasant 45 y.o. year old female who presents to clinic today with Hand Pain (right x79mos)  on 12/28/2015  HPI:  Two months ago, dog ran up to her and hit the lateral edge of her right hand. Since then, with certain movements, she has sharp pain across the top her hand, mainly 2nd and 3rd digits.  No numbness.  No decreased grip strength.     Current Outpatient Prescriptions on File Prior to Visit  Medication Sig Dispense Refill  . B Complex Vitamins (B COMPLEX PO) Take by mouth.    . Flax Oil-Fish Oil-Borage Oil (FISH OIL-FLAX OIL-BORAGE OIL) CAPS Take by mouth.    [name] lisinopril (PRINIVIL,ZESTRIL) 10 MG tablet Take 1 tablet (10 mg total) by mouth daily. 90 tablet 3  . Multiple Vitamin (MULTIVITAMIN) tablet Take 1 tablet by mouth daily.    . norethindrone-ethinyl estradiol (MICROGESTIN,JUNEL,LOESTRIN) 1-20 MG-MCG tablet Take 1 tablet by mouth daily.  0   No current facility-administered medications on file prior to visit.     Allergies  Allergen Reactions  . Penicillins Shortness Of Breath    Past Medical History:  Diagnosis Date  . Allergy   . Endometriosis    Followed by Lynnell Chad- on Nortrel    Past Surgical History:  Procedure Laterality Date  . APPENDECTOMY    . NASAL SINUS SURGERY    . OVARIAN CYST SURGERY    . TONSILLECTOMY  1994    Family History  Problem Relation Age of Onset  . COPD Mother   . Hypertension Mother   . Alzheimer's disease Father   . Hypertension Father     Social History   Social History  . Marital status: Single    Spouse name: N/A  . Number of children: N/A  . Years of education: N/A   Occupational History  . Not on file.   Social History Main Topics  . Smoking status: Never Smoker  . Smokeless tobacco: Never Used  . Alcohol use 0.0 oz/week  . Drug use: No  . Sexual activity: Not on file   Other  Topics Concern  . Not on file   Social History Narrative   Works at [employer].  Recently moved here from [place].   G0   The PMH, PSH, Social History, Family History, Medications, and allergies have been reviewed in [facility], and have been updated if relevant.   Review of Systems  Constitutional: Negative.   HENT: Negative.   Musculoskeletal: Positive for arthralgias. Negative for back pain.  Neurological: Negative for tremors, weakness, light-headedness and numbness.  All other systems reviewed and are negative.      Objective:    BP (!) 134/92   Pulse 70   Temp 98.6 F (37 C) (Oral)   Wt 217 lb (98.4 kg)   SpO2 97%   BMI 35.56 kg/m    Physical Exam  Constitutional: She is oriented to person, place, and time. She appears well-developed and well-nourished. No distress.  HENT:  Head: Normocephalic and atraumatic.  Eyes: Conjunctivae are normal.  Cardiovascular: Normal rate.   Pulmonary/Chest: Effort normal.  Musculoskeletal:       Arms:      Right hand: She exhibits normal range of motion, no tenderness, no bony tenderness, normal capillary refill and no deformity. Normal sensation noted. Normal strength noted.  Neurological: She is alert  and oriented to person, place, and time. No cranial nerve deficit.  Normal grip strength bilaterally  Skin: Skin is dry. She is not diaphoretic.  Psychiatric: She has a normal mood and affect. Her behavior is normal. Judgment and thought content normal.  Nursing note and vitals reviewed.         Assessment & Plan:   Right hand pain No Follow-up on file.

## 2016-02-05 ENCOUNTER — Encounter: Payer: Self-pay | Admitting: Family Medicine

## 2016-02-08 ENCOUNTER — Ambulatory Visit (INDEPENDENT_AMBULATORY_CARE_PROVIDER_SITE_OTHER): Payer: Managed Care, Other (non HMO) | Admitting: Family Medicine

## 2016-02-08 ENCOUNTER — Encounter: Payer: Self-pay | Admitting: Family Medicine

## 2016-02-08 VITALS — BP 136/70 | HR 73 | Temp 98.6°F | Wt 226.0 lb

## 2016-02-08 DIAGNOSIS — R202 Paresthesia of skin: Secondary | ICD-10-CM | POA: Insufficient documentation

## 2016-02-08 DIAGNOSIS — I10 Essential (primary) hypertension: Secondary | ICD-10-CM | POA: Diagnosis not present

## 2016-02-08 NOTE — Patient Instructions (Signed)
Great to see you. Let's STOP lisinopril.  Please check your blood pressure daily at home.  Keep a log.  Come see me in 2 weeks.

## 2016-02-08 NOTE — Progress Notes (Signed)
Pre visit review using our clinic review tool, if applicable. No additional management support is needed unless otherwise documented below in the visit note. 

## 2016-02-08 NOTE — Progress Notes (Signed)
Subjective:   Patient ID: Ruth Elliott, female    DOB: 1970-07-21, 46 y.o.   MRN: [id number]  Ruth Elliott is a pleasant 46 y.o. year old female who presents to clinic today with Follow-up and Medication Reaction (cramping and numbness after starting lisinopril)  on 02/08/2016  HPI:  Having intermittent myalgias and paresthesia in her hands and feet.  She feels this started shortly after starting lisinopril.  Currently only taking lisinopril 5 mg daily.  Has h/o white coat HTN.  At home, BP was 115/70 yesterday.  She feels fatigued.   Current Outpatient Prescriptions on File Prior to Visit  Medication Sig Dispense Refill  . B Complex Vitamins (B COMPLEX PO) Take by mouth.    . Flax Oil-Fish Oil-Borage Oil (FISH OIL-FLAX OIL-BORAGE OIL) CAPS Take by mouth.    [name] lisinopril (PRINIVIL,ZESTRIL) 10 MG tablet Take 1 tablet (10 mg total) by mouth daily. (Patient taking differently: Take 5 mg by mouth daily. ) 90 tablet 3  . Multiple Vitamin (MULTIVITAMIN) tablet Take 1 tablet by mouth daily.    . norethindrone-ethinyl estradiol (MICROGESTIN,JUNEL,LOESTRIN) 1-20 MG-MCG tablet Take 1 tablet by mouth daily.  0   No current facility-administered medications on file prior to visit.     Allergies  Allergen Reactions  . Penicillins Shortness Of Breath    Past Medical History:  Diagnosis Date  . Allergy   . Endometriosis    Followed by Lynnell Chad- on Nortrel    Past Surgical History:  Procedure Laterality Date  . APPENDECTOMY    . NASAL SINUS SURGERY    . OVARIAN CYST SURGERY    . TONSILLECTOMY  1994    Family History  Problem Relation Age of Onset  . COPD Mother   . Hypertension Mother   . Alzheimer's disease Father   . Hypertension Father     Social History   Social History  . Marital status: Single    Spouse name: N/A  . Number of children: N/A  . Years of education: N/A   Occupational History  . Not on file.   Social History Main Topics  . Smoking  status: Never Smoker  . Smokeless tobacco: Never Used  . Alcohol use 0.0 oz/week  . Drug use: No  . Sexual activity: Not on file   Other Topics Concern  . Not on file   Social History Narrative   Works at [employer].  Recently moved here from [place].   G0   The PMH, PSH, Social History, Family History, Medications, and allergies have been reviewed in [facility], and have been updated if relevant.  Review of Systems  Constitutional: Positive for fatigue. Negative for fever and unexpected weight change.  Musculoskeletal: Positive for arthralgias and myalgias. Negative for back pain, gait problem, joint swelling, neck pain and neck stiffness.  Neurological: Positive for numbness. Negative for weakness.  All other systems reviewed and are negative.      Objective:    BP 136/70   Pulse 73   Temp 98.6 F (37 C) (Oral)   Wt 226 lb (102.5 kg)   SpO2 97%   BMI 37.04 kg/m   Wt Readings from Last 3 Encounters:  02/08/16 226 lb (102.5 kg)  12/28/15 217 lb (98.4 kg)  12/18/15 220 lb (99.8 kg)    Physical Exam  Constitutional: She is oriented to person, place, and time. She appears well-developed and well-nourished. No distress.  HENT:  Head: Normocephalic and atraumatic.  Eyes: Conjunctivae are normal.  Cardiovascular: Normal rate.   Pulmonary/Chest: Effort normal.  Musculoskeletal: Normal range of motion.  Neurological: She is alert and oriented to person, place, and time.  Skin: Skin is warm and dry. She is not diaphoretic.  Psychiatric: She has a normal mood and affect. Her behavior is normal. Judgment and thought content normal.  Nursing note and vitals reviewed.         Assessment & Plan:   Paresthesia No Follow-up on file.

## 2016-02-08 NOTE — Assessment & Plan Note (Addendum)
With cramping. >15 minutes spent in face to face time with patient, >50% spent in counselling or coordination of care Explained that myalgias and paresthesias are certainly possible as an adverse rxn to lisinopril (listed in >1 % of the population). ? Also if we are bringing down her BP too much. D/c lisinopril.  She will check BP at home and call me with an update next week, sooner if it becomes elevated. Follow up with me in the office in 2 weeks. The patient indicates understanding of these issues and agrees with the plan.

## 2016-02-13 ENCOUNTER — Ambulatory Visit: Payer: Managed Care, Other (non HMO) | Admitting: Family Medicine

## 2016-02-19 ENCOUNTER — Encounter: Payer: Self-pay | Admitting: Family Medicine

## 2016-02-19 ENCOUNTER — Ambulatory Visit (INDEPENDENT_AMBULATORY_CARE_PROVIDER_SITE_OTHER): Payer: Managed Care, Other (non HMO) | Admitting: Family Medicine

## 2016-02-19 VITALS — BP 152/94 | HR 70 | Temp 98.3°F | Wt 224.0 lb

## 2016-02-19 DIAGNOSIS — I10 Essential (primary) hypertension: Secondary | ICD-10-CM

## 2016-02-19 DIAGNOSIS — R202 Paresthesia of skin: Secondary | ICD-10-CM | POA: Diagnosis not present

## 2016-02-19 NOTE — Assessment & Plan Note (Addendum)
Well controlled without rx at home. H/o white coat HTN. Will not add another antihypertensive. She will continue to watch BP at home.

## 2016-02-19 NOTE — Progress Notes (Signed)
Subjective:   Patient ID: Ruth Elliott, female    DOB: March 14, 1970, 46 y.o.   MRN: [id number]  Ruth Elliott is a pleasant 46 y.o. year old female who presents to clinic today with Follow-up  on 02/19/2016  HPI:  Saw her on 02/08/16 for paresthesias.  Note reviewed. Ruth Elliott felt that her myalgias and paresthesia in her hands and feet started after starting Lisinopril.  H/o white coat HTN but per pt, at home her BP was running in low 100s/70s.  We therefore stopped her Lisinopril and advised her to follow up her blood pressures at home and with me today.  BP elevated here but brings in log from home- ranging 120s- 130s/70s-80s.  Parethesias and myalgias have resolved. Lab Results  Component Value Date   CREATININE 0.84 07/31/2015   Current Outpatient Prescriptions on File Prior to Visit  Medication Sig Dispense Refill  . B Complex Vitamins (B COMPLEX PO) Take by mouth.    . Flax Oil-Fish Oil-Borage Oil (FISH OIL-FLAX OIL-BORAGE OIL) CAPS Take by mouth.    . Multiple Vitamin (MULTIVITAMIN) tablet Take 1 tablet by mouth daily.    . norethindrone-ethinyl estradiol (MICROGESTIN,JUNEL,LOESTRIN) 1-20 MG-MCG tablet Take 1 tablet by mouth daily.  0   No current facility-administered medications on file prior to visit.     Allergies  Allergen Reactions  . Penicillins Shortness Of Breath  . Lisinopril     paresthesia    Past Medical History:  Diagnosis Date  . Allergy   . Endometriosis    Followed by Lynnell Chad- on Nortrel    Past Surgical History:  Procedure Laterality Date  . APPENDECTOMY    . NASAL SINUS SURGERY    . OVARIAN CYST SURGERY    . TONSILLECTOMY  1994    Family History  Problem Relation Age of Onset  . COPD Mother   . Hypertension Mother   . Alzheimer's disease Father   . Hypertension Father     Social History   Social History  . Marital status: Single    Spouse name: N/A  . Number of children: N/A  . Years of education: N/A    Occupational History  . Not on file.   Social History Main Topics  . Smoking status: Never Smoker  . Smokeless tobacco: Never Used  . Alcohol use 0.0 oz/week  . Drug use: No  . Sexual activity: Not on file   Other Topics Concern  . Not on file   Social History Narrative   Works at [employer].  Recently moved here from [place].   G0   The PMH, PSH, Social History, Family History, Medications, and allergies have been reviewed in [facility], and have been updated if relevant.   Review of Systems  Constitutional: Negative.   HENT: Positive for congestion.   Respiratory: Negative.   Cardiovascular: Negative.   Gastrointestinal: Negative.   Musculoskeletal: Negative.   Neurological: Negative.   Hematological: Negative.   Psychiatric/Behavioral: Negative.   All other systems reviewed and are negative.      Objective:    BP (!) 152/94   Pulse 70   Temp 98.3 F (36.8 C) (Oral)   Wt 224 lb (101.6 kg)   SpO2 97%   BMI 36.71 kg/m    Physical Exam  Constitutional: She is oriented to person, place, and time. She appears well-developed and well-nourished. No distress.  HENT:  Head: Normocephalic and atraumatic.  Eyes: Conjunctivae are normal.  Cardiovascular: Normal rate and regular rhythm.  Pulmonary/Chest: Effort normal.  Musculoskeletal: Normal range of motion. She exhibits no edema.  Neurological: She is alert and oriented to person, place, and time. No cranial nerve deficit.  Skin: Skin is warm and dry. She is not diaphoretic.  Psychiatric: She has a normal mood and affect. Her behavior is normal. Judgment and thought content normal.  Nursing note and vitals reviewed.         Assessment & Plan:   Essential hypertension  Paresthesia No Follow-up on file.

## 2016-02-19 NOTE — Assessment & Plan Note (Signed)
Resolved since stopping lisinopril. Added to allergy  List.

## 2016-02-19 NOTE — Patient Instructions (Signed)
Great to see you.  If you BP is consistently over 140s/90s, please call me.

## 2016-02-19 NOTE — Progress Notes (Signed)
Pre visit review using our clinic review tool, if applicable. No additional management support is needed unless otherwise documented below in the visit note. 

## 2016-04-30 ENCOUNTER — Ambulatory Visit (INDEPENDENT_AMBULATORY_CARE_PROVIDER_SITE_OTHER): Payer: Managed Care, Other (non HMO) | Admitting: Family Medicine

## 2016-04-30 DIAGNOSIS — L243 Irritant contact dermatitis due to cosmetics: Secondary | ICD-10-CM | POA: Diagnosis not present

## 2016-04-30 DIAGNOSIS — L259 Unspecified contact dermatitis, unspecified cause: Secondary | ICD-10-CM | POA: Insufficient documentation

## 2016-04-30 MED ORDER — DEXAMETHASONE SODIUM PHOSPHATE 10 MG/ML IJ SOLN
10.0000 mg | Freq: Once | INTRAMUSCULAR | Status: AC
  Administered 2016-04-30: 10 mg via INTRAMUSCULAR

## 2016-04-30 MED ORDER — PREDNISONE 20 MG PO TABS: ORAL_TABLET | ORAL | 0 refills | Status: DC

## 2016-04-30 NOTE — Progress Notes (Signed)
Subjective:   Patient ID: Ruth Elliott, female    DOB: [birth year], 46 y.o.   MRN: [id number]  Ruth Elliott is a pleasant 46 y.o. year old female who presents to clinic today with Rash (Used a new bath gel on 04-26-16. Has a rash with blisters on both arms.)  on 04/30/2016  HPI:  Rash- blisters on both arms. Started shortly after using a new bath gel 4 days ago.  Itchy and painful.  Has been applying topical neosporin and hydrocortisone.  No fevers.  No difficulty breathing or swallowing. No SOB.  Current Outpatient Prescriptions on File Prior to Visit  Medication Sig Dispense Refill  . B Complex Vitamins (B COMPLEX PO) Take by mouth.    . Flax Oil-Fish Oil-Borage Oil (FISH OIL-FLAX OIL-BORAGE OIL) CAPS Take by mouth.    . Multiple Vitamin (MULTIVITAMIN) tablet Take 1 tablet by mouth daily.    . norethindrone-ethinyl estradiol (MICROGESTIN,JUNEL,LOESTRIN) 1-20 MG-MCG tablet Take 1 tablet by mouth daily.  0   No current facility-administered medications on file prior to visit.     Allergies  Allergen Reactions  . Penicillins Shortness Of Breath  . Lisinopril     paresthesia    Past Medical History:  Diagnosis Date  . Allergy   . Endometriosis    Followed by Lynnell Chad- on Nortrel    Past Surgical History:  Procedure Laterality Date  . APPENDECTOMY    . NASAL SINUS SURGERY    . OVARIAN CYST SURGERY    . TONSILLECTOMY  1994    Family History  Problem Relation Age of Onset  . COPD Mother   . Hypertension Mother   . Alzheimer's disease Father   . Hypertension Father     Social History   Social History  . Marital status: Single    Spouse name: N/A  . Number of children: N/A  . Years of education: N/A   Occupational History  . Not on file.   Social History Main Topics  . Smoking status: Never Smoker  . Smokeless tobacco: Never Used  . Alcohol use 0.0 oz/week  . Drug use: No  . Sexual activity: Not on file   Other Topics Concern  . Not on  file   Social History Narrative   Works at [employer].  Recently moved here from [place].   G0   The PMH, PSH, Social History, Family History, Medications, and allergies have been reviewed in [facility], and have been updated if relevant.  Review of Systems  Respiratory: Negative.   Cardiovascular: Negative.   Gastrointestinal: Negative.   Skin: Positive for rash.  All other systems reviewed and are negative.      Objective:    BP (!) 146/90 (BP Location: Left Arm, Patient Position: Sitting, Cuff Size: Large)   Pulse 62   Temp 98.8 F (37.1 C) (Oral)   Wt 223 lb (101.2 kg)   SpO2 92%   BMI 36.54 kg/m    Physical Exam  Constitutional: She is oriented to person, place, and time. She appears well-developed and well-nourished. No distress.  HENT:  Head: Normocephalic and atraumatic.  Eyes: Conjunctivae are normal.  Cardiovascular: Normal rate.   Pulmonary/Chest: Effort normal.  Musculoskeletal: Normal range of motion.  Neurological: She is alert and oriented to person, place, and time. No cranial nerve deficit.  Skin: She is not diaphoretic.     Psychiatric: She has a normal mood and affect. Her behavior is normal. Judgment and thought content normal.  Nursing  note and vitals reviewed.         Assessment & Plan:   Irritant contact dermatitis due to cosmetics No Follow-up on file.

## 2016-04-30 NOTE — Assessment & Plan Note (Signed)
New- likely secondary to new shower gel. She will no longer use this gel. IM decardon given in office given severity of reaction. Start oral prednisone taper tomorrow. Keep area clean as per AVS. Call or return to clinic prn if these symptoms worsen or fail to improve as anticipated. The patient indicates understanding of these issues and agrees with the plan.

## 2016-04-30 NOTE — Addendum Note (Signed)
Addended by: Pilar Grammes on: 04/30/2016 07:50 AM   Modules accepted: Orders

## 2016-04-30 NOTE — Patient Instructions (Signed)
Contact Dermatitis Dermatitis is redness, soreness, and swelling (inflammation) of the skin. Contact dermatitis is a reaction to certain substances that touch the skin. There are two types of contact dermatitis:  Irritant contact dermatitis. This type is caused by something that irritates your skin, such as dry hands from washing them too much. This type does not require previous exposure to the substance for a reaction to occur. This type is more common.  Allergic contact dermatitis. This type is caused by a substance that you are allergic to, such as a nickel allergy or poison ivy. This type only occurs if you have been exposed to the substance (allergen) before. Upon a repeat exposure, your body reacts to the substance. This type is less common. What are the causes? Many different substances can cause contact dermatitis. Irritant contact dermatitis is most commonly caused by exposure to:  Makeup.  Soaps.  Detergents.  Bleaches.  Acids.  Metal salts, such as nickel. Allergic contact dermatitis is most commonly caused by exposure to:  Poisonous plants.  Chemicals.  Jewelry.  Latex.  Medicines.  Preservatives in products, such as clothing. What increases the risk? This condition is more likely to develop in:  People who have jobs that expose them to irritants or allergens.  People who have certain medical conditions, such as asthma or eczema. What are the signs or symptoms? Symptoms of this condition may occur anywhere on your body where the irritant has touched you or is touched by you. Symptoms include:  Dryness or flaking.  Redness.  Cracks.  Itching.  Pain or a burning feeling.  Blisters.  Drainage of small amounts of blood or clear fluid from skin cracks. With allergic contact dermatitis, there may also be swelling in areas such as the eyelids, mouth, or genitals. How is this diagnosed? This condition is diagnosed with a medical history and physical exam.  A patch skin test may be performed to help determine the cause. If the condition is related to your job, you may need to see an occupational medicine specialist. How is this treated? Treatment for this condition includes figuring out what caused the reaction and protecting your skin from further contact. Treatment may also include:  Steroid creams or ointments. Oral steroid medicines may be needed in more severe cases.  Antibiotics or antibacterial ointments, if a skin infection is present.  Antihistamine lotion or an antihistamine taken by mouth to ease itching.  A bandage (dressing). Follow these instructions at home: [place] your skin as needed.  Apply cool compresses to the affected areas.  Try taking a bath with:  Epsom salts. Follow the instructions on the packaging. You can get these at your local pharmacy or grocery store.  Baking soda. Pour a small amount into the bath as directed by your health care provider.  Colloidal oatmeal. Follow the instructions on the packaging. You can get this at your local pharmacy or grocery store.  Try applying baking soda paste to your skin. Stir water into baking soda until it reaches a paste-like consistency.  Do not scratch your skin.  Bathe less frequently, such as every other day.  Bathe in lukewarm water. Avoid using hot water. Medicines   Take or apply over-the-counter and prescription medicines only as told by your health care provider.  If you were prescribed an antibiotic medicine, take or apply your antibiotic as told by your health care provider. Do not stop using the antibiotic even if your condition starts to improve. General  instructions   Keep all follow-up visits as told by your health care provider. This is important.  Avoid the substance that caused your reaction. If you do not know what caused it, keep a journal to try to track what caused it. Write down:  What you eat.  What cosmetic products  you use.  What you drink.  What you wear in the affected area. This includes jewelry.  If you were given a dressing, take care of it as told by your health care provider. This includes when to change and remove it. Contact a health care provider if:  Your condition does not improve with treatment.  Your condition gets worse.  You have signs of infection such as swelling, tenderness, redness, soreness, or warmth in the affected area.  You have a fever.  You have new symptoms. Get help right away if:  You have a severe headache, neck pain, or neck stiffness.  You vomit.  You feel very sleepy.  You notice red streaks coming from the affected area.  Your bone or joint underneath the affected area becomes painful after the skin has healed.  The affected area turns darker.  You have difficulty breathing. This information is not intended to replace advice given to you by your health care provider. Make sure you discuss any questions you have with your health care provider. Document Released: 12/29/1999 Document Revised: 06/08/2015 Document Reviewed: 05/18/2014 Elsevier Interactive Patient Education  2017 [employer].

## 2016-04-30 NOTE — Progress Notes (Signed)
Pre visit review using our clinic review tool, if applicable. No additional management support is needed unless otherwise documented below in the visit note. 

## 2016-05-01 ENCOUNTER — Ambulatory Visit: Payer: Self-pay | Admitting: Family Medicine

## 2016-05-29 ENCOUNTER — Other Ambulatory Visit: Payer: Self-pay | Admitting: Family Medicine

## 2016-05-29 ENCOUNTER — Ambulatory Visit (INDEPENDENT_AMBULATORY_CARE_PROVIDER_SITE_OTHER): Payer: Managed Care, Other (non HMO) | Admitting: Family Medicine

## 2016-05-29 ENCOUNTER — Ambulatory Visit (INDEPENDENT_AMBULATORY_CARE_PROVIDER_SITE_OTHER)
Admission: RE | Admit: 2016-05-29 | Discharge: 2016-05-29 | Disposition: A | Discharge status: Home / Self Care | Payer: Managed Care, Other (non HMO) | Source: Ambulatory Visit | Attending: Family Medicine | Admitting: Family Medicine

## 2016-05-29 ENCOUNTER — Encounter: Payer: Self-pay | Admitting: Family Medicine

## 2016-05-29 DIAGNOSIS — S8992XA Unspecified injury of left lower leg, initial encounter: Secondary | ICD-10-CM

## 2016-05-29 NOTE — Progress Notes (Signed)
SUBJECTIVE: Ruth Elliott is a 46 y.o. female who sustained a left knee injury 4 day(s) ago. Mechanism of injury: cutting grass in flat shoes. Immediate symptoms: delayed pain, delayed swelling. Symptoms have been constant since that time. Prior history of related problems: no prior problems with this area in the past.  Current Outpatient Prescriptions on File Prior to Visit  Medication Sig Dispense Refill  . B Complex Vitamins (B COMPLEX PO) Take by mouth.    . Flax Oil-Fish Oil-Borage Oil (FISH OIL-FLAX OIL-BORAGE OIL) CAPS Take by mouth.    . Misc Natural Products (GLUCOSAMINE CHOND COMPLEX/MSM PO) Take by mouth.    . Multiple Vitamin (MULTIVITAMIN) tablet Take 1 tablet by mouth daily.    . norethindrone-ethinyl estradiol (MICROGESTIN,JUNEL,LOESTRIN) 1-20 MG-MCG tablet Take 1 tablet by mouth daily.  0   No current facility-administered medications on file prior to visit.     Allergies  Allergen Reactions  . Penicillins Shortness Of Breath  . Lisinopril     paresthesia    Past Medical History:  Diagnosis Date  . Allergy   . Endometriosis    Followed by Lynnell Chad- on Nortrel    Past Surgical History:  Procedure Laterality Date  . APPENDECTOMY    . NASAL SINUS SURGERY    . OVARIAN CYST SURGERY    . TONSILLECTOMY  1994    Family History  Problem Relation Age of Onset  . COPD Mother   . Hypertension Mother   . Alzheimer's disease Father   . Hypertension Father     Social History   Social History  . Marital status: Single    Spouse name: N/A  . Number of children: N/A  . Years of education: N/A   Occupational History  . Not on file.   Social History Main Topics  . Smoking status: Never Smoker  . Smokeless tobacco: Never Used  . Alcohol use 0.0 oz/week  . Drug use: No  . Sexual activity: Not on file   Other Topics Concern  . Not on file   Social History Narrative   Works at [employer].  Recently moved here from [place].   G0   The PMH, PSH,  Social History, Family History, Medications, and allergies have been reviewed in [facility], and have been updated if relevant.  OBJECTIVE: BP (!) 192/112   Pulse 75   Temp 99.5 F (37.5 C)   Wt 221 lb (100.2 kg)   SpO2 99%   BMI 36.22 kg/m   Vital signs as noted above. Appearance: alert, well appearing, and in no distress. Knee exam: effusion, reduced range of motion, exam limited by acuity of pain, collateral ligaments intact, negative McMurray sign, normal ipsilateral hip exam. X-ray: ordered, but results not yet available.  ASSESSMENT: Knee internal derangement  PLAN: rest the injured area as much as practical, X-Ray ordered See orders for this visit as documented in the electronic medical record.

## 2016-05-29 NOTE — Progress Notes (Signed)
Pre visit review using our clinic review tool, if applicable. No additional management support is needed unless otherwise documented below in the visit note. 

## 2016-05-30 ENCOUNTER — Encounter: Payer: Self-pay | Admitting: Family Medicine

## 2016-06-20 ENCOUNTER — Ambulatory Visit (INDEPENDENT_AMBULATORY_CARE_PROVIDER_SITE_OTHER): Payer: Managed Care, Other (non HMO) | Admitting: Family Medicine

## 2016-06-20 ENCOUNTER — Encounter: Payer: Self-pay | Admitting: Family Medicine

## 2016-06-20 VITALS — BP 180/120 | HR 67 | Wt 217.0 lb

## 2016-06-20 DIAGNOSIS — L249 Irritant contact dermatitis, unspecified cause: Secondary | ICD-10-CM | POA: Diagnosis not present

## 2016-06-20 MED ORDER — FLUOCINONIDE-E 0.05 % EX CREA: 1.0000 "application " | TOPICAL_CREAM | Freq: Two times a day (BID) | CUTANEOUS | 0 refills | Status: DC

## 2016-06-20 NOTE — Assessment & Plan Note (Signed)
Consistent with plant dermatitis but not as widespread this time. No signs of infection. Will treat with topical lidex versus oral prednisone given more localized and less itchy rash this time. Call or return to clinic prn if these symptoms worsen or fail to improve as anticipated. The patient indicates understanding of these issues and agrees with the plan.

## 2016-06-20 NOTE — Progress Notes (Signed)
Pre visit review using our clinic review tool, if applicable. No additional management support is needed unless otherwise documented below in the visit note. 

## 2016-06-20 NOTE — Progress Notes (Signed)
Subjective:   Patient ID: Ruth Elliott, female    DOB: [birth year], 46 y.o.   MRN: [id number]  Ruth Elliott is a pleasant 46 y.o. year old female who presents to clinic today with Rash  on 06/20/2016  HPI:  Has had poison ivy several times this year.  Usually on her forearms.  Flared up again two days ago after she was brushing her horse.  This time, rash is not as wide spread. She is starting to wonder if maybe the poison ivy oils are on her horse's coat.  Rash is very itchy. No systemic symptoms.  Current Outpatient Prescriptions on File Prior to Visit  Medication Sig Dispense Refill  . B Complex Vitamins (B COMPLEX PO) Take by mouth.    . fexofenadine (ALLEGRA ALLERGY) 180 MG tablet Take 180 mg by mouth daily.    . Flax Oil-Fish Oil-Borage Oil (FISH OIL-FLAX OIL-BORAGE OIL) CAPS Take by mouth.    . Misc Natural Products (GLUCOSAMINE CHOND COMPLEX/MSM PO) Take by mouth.    . Multiple Vitamin (MULTIVITAMIN) tablet Take 1 tablet by mouth daily.    . norethindrone-ethinyl estradiol (MICROGESTIN,JUNEL,LOESTRIN) 1-20 MG-MCG tablet Take 1 tablet by mouth daily.  0   No current facility-administered medications on file prior to visit.     Allergies  Allergen Reactions  . Penicillins Shortness Of Breath  . Lisinopril     paresthesia    Past Medical History:  Diagnosis Date  . Allergy   . Endometriosis    Followed by Lynnell Chad- on Nortrel    Past Surgical History:  Procedure Laterality Date  . APPENDECTOMY    . NASAL SINUS SURGERY    . OVARIAN CYST SURGERY    . TONSILLECTOMY  1994    Family History  Problem Relation Age of Onset  . COPD Mother   . Hypertension Mother   . Alzheimer's disease Father   . Hypertension Father     Social History   Social History  . Marital status: Single    Spouse name: N/A  . Number of children: N/A  . Years of education: N/A   Occupational History  . Not on file.   Social History Main Topics  . Smoking status:  Never Smoker  . Smokeless tobacco: Never Used  . Alcohol use 0.0 oz/week  . Drug use: No  . Sexual activity: Not on file   Other Topics Concern  . Not on file   Social History Narrative   Works at [employer].  Recently moved here from [place].   G0   The PMH, PSH, Social History, Family History, Medications, and allergies have been reviewed in [facility], and have been updated if relevant.  Review of Systems  Constitutional: Negative.   Skin: Positive for rash.  All other systems reviewed and are negative.      Objective:    BP (!) 180/120   Pulse 67   Wt 217 lb (98.4 kg)   SpO2 98%   BMI 35.56 kg/m    Physical Exam  Constitutional: She is oriented to person, place, and time. She appears well-developed and well-nourished. No distress.  HENT:  Head: Normocephalic and atraumatic.  Eyes: Conjunctivae are normal.  Cardiovascular: Normal rate.   Pulmonary/Chest: Effort normal.  Musculoskeletal: Normal range of motion.  Neurological: She is alert and oriented to person, place, and time. No cranial nerve deficit.  Skin: She is not diaphoretic.     Psychiatric: She has a normal mood and affect. Her behavior  is normal. Judgment and thought content normal.  Nursing note and vitals reviewed.         Assessment & Plan:   Irritant contact dermatitis, unspecified trigger No Follow-up on file.

## 2016-06-20 NOTE — Patient Instructions (Signed)
Poison Ivy Dermatitis Poison ivy dermatitis is redness and soreness (inflammation) of the skin. It is caused by a chemical that is found on the leaves of the poison ivy plant. You may also have itching, a rash, and blisters. Symptoms often clear up in 1-2 weeks. You may get this condition by touching a poison ivy plant. You can also get it by touching something that has the chemical on it. This may include animals or objects that have come in contact with the plant. Follow these instructions at home: General instructions  Take or apply over-the-counter and prescription medicines only as told by your doctor.  If you touch poison ivy, wash your skin with soap and cold water right away.  Use hydrocortisone creams or calamine lotion as needed to help with itching.  Take oatmeal baths as needed. Use colloidal oatmeal. You can get this at a pharmacy or grocery store. Follow the instructions on the package.  Do not scratch or rub your skin.  While you have the rash, wash your clothes right after you wear them. Prevention  Know what poison ivy looks like so you can avoid it. This plant has three leaves with flowering branches on a single stem. The leaves are glossy. They have uneven edges that come to a point at the front.  If you have touched poison ivy, wash with soap and water right away. Be sure to wash under your fingernails.  When hiking or camping, wear long pants, a long-sleeved shirt, tall socks, and hiking boots. You can also use a lotion on your skin that helps to prevent contact with the chemical on the plant.  If you think that your clothes or outdoor gear came in contact with poison ivy, rinse them off with a garden hose before you bring them inside your house. Contact a doctor if:  You have open sores in the rash area.  You have more redness, swelling, or pain in the affected area.  You have redness that spreads beyond the rash area.  You have fluid, blood, or pus coming from  the affected area.  You have a fever.  You have a rash over a large area of your body.  You have a rash on your eyes, mouth, or genitals.  Your rash does not get better after a few days. Get help right away if:  Your face swells or your eyes swell shut.  You have trouble breathing.  You have trouble swallowing. This information is not intended to replace advice given to you by your health care provider. Make sure you discuss any questions you have with your health care provider. Document Released: 02/02/2010 Document Revised: 06/08/2015 Document Reviewed: 06/08/2014 Elsevier Interactive Patient Education  2018 Elsevier Inc.  

## 2016-07-09 ENCOUNTER — Ambulatory Visit (INDEPENDENT_AMBULATORY_CARE_PROVIDER_SITE_OTHER): Payer: Managed Care, Other (non HMO) | Admitting: Family Medicine

## 2016-07-09 ENCOUNTER — Encounter: Payer: Self-pay | Admitting: Family Medicine

## 2016-07-09 VITALS — BP 158/100 | HR 76 | Temp 99.4°F | Wt 211.8 lb

## 2016-07-09 DIAGNOSIS — J069 Acute upper respiratory infection, unspecified: Secondary | ICD-10-CM

## 2016-07-09 MED ORDER — ALBUTEROL SULFATE HFA 108 (90 BASE) MCG/ACT IN AERS: 2.0000 | INHALATION_SPRAY | Freq: Four times a day (QID) | RESPIRATORY_TRACT | 0 refills | Status: DC | PRN

## 2016-07-09 MED ORDER — AZITHROMYCIN 250 MG PO TABS: ORAL_TABLET | ORAL | 0 refills | Status: DC

## 2016-07-09 MED ORDER — HYDROCOD POLST-CPM POLST ER 10-8 MG/5ML PO SUER: 5.0000 mL | Freq: Two times a day (BID) | ORAL | 0 refills | Status: DC | PRN

## 2016-07-09 NOTE — Progress Notes (Signed)
SUBJECTIVE:  Ruth Elliott is a 46 y.o. female who complains of congestion, sore throat, cough, wheezing and bilateral sinus pain for 8 days. She denies a history of anorexia, chest pain and fevers and denies a history of asthma. Patient denies smoke cigarettes.   Current Outpatient Prescriptions on File Prior to Visit  Medication Sig Dispense Refill  . B Complex Vitamins (B COMPLEX PO) Take by mouth.    . fexofenadine (ALLEGRA ALLERGY) 180 MG tablet Take 180 mg by mouth daily.    . Flax Oil-Fish Oil-Borage Oil (FISH OIL-FLAX OIL-BORAGE OIL) CAPS Take by mouth.    . Misc Natural Products (GLUCOSAMINE CHOND COMPLEX/MSM PO) Take by mouth.    . Multiple Vitamin (MULTIVITAMIN) tablet Take 1 tablet by mouth daily.    . norethindrone-ethinyl estradiol (MICROGESTIN,JUNEL,LOESTRIN) 1-20 MG-MCG tablet Take 1 tablet by mouth daily.  0   No current facility-administered medications on file prior to visit.     Allergies  Allergen Reactions  . Penicillins Shortness Of Breath  . Lisinopril     paresthesia    Past Medical History:  Diagnosis Date  . Allergy   . Endometriosis    Followed by Lynnell Chad- on Nortrel    Past Surgical History:  Procedure Laterality Date  . APPENDECTOMY    . NASAL SINUS SURGERY    . OVARIAN CYST SURGERY    . TONSILLECTOMY  1994    Family History  Problem Relation Age of Onset  . COPD Mother   . Hypertension Mother   . Alzheimer's disease Father   . Hypertension Father     Social History   Social History  . Marital status: Single    Spouse name: N/A  . Number of children: N/A  . Years of education: N/A   Occupational History  . Not on file.   Social History Main Topics  . Smoking status: Never Smoker  . Smokeless tobacco: Never Used  . Alcohol use 0.0 oz/week  . Drug use: No  . Sexual activity: Not on file   Other Topics Concern  . Not on file   Social History Narrative   Works at [employer].  Recently moved here from [place].   G0    The PMH, PSH, Social History, Family History, Medications, and allergies have been reviewed in [facility], and have been updated if relevant.  OBJECTIVE: BP (!) 158/100 (BP Location: Right Arm, Patient Position: Sitting, Cuff Size: Large)   Pulse 76   Temp 99.4 F (37.4 C) (Oral)   Wt 211 lb 12 oz (96 kg)   SpO2 96%   BMI 34.70 kg/m   She appears well, vital signs are as noted. Ears normal.  Throat and pharynx normal.  Neck supple. No adenopathy in the neck. Nose is congested. Sinuses  Tender. Scattered exp wheezes, right > left   ASSESSMENT:  sinusitis and bronchitis  PLAN: zpack (PCN allergic), pro air and tussionex as needed for cough/wheezing. Symptomatic therapy suggested: push fluids, rest and return office visit prn if symptoms persist or worsen.Call or return to clinic prn if these symptoms worsen or fail to improve as anticipated.

## 2016-07-15 ENCOUNTER — Encounter: Payer: Self-pay | Admitting: Family Medicine

## 2016-07-18 ENCOUNTER — Ambulatory Visit (INDEPENDENT_AMBULATORY_CARE_PROVIDER_SITE_OTHER): Payer: Managed Care, Other (non HMO) | Admitting: Family Medicine

## 2016-07-18 DIAGNOSIS — R05 Cough: Secondary | ICD-10-CM

## 2016-07-18 DIAGNOSIS — R053 Chronic cough: Secondary | ICD-10-CM | POA: Insufficient documentation

## 2016-07-18 NOTE — Progress Notes (Signed)
Subjective:   Patient ID: Ruth Elliott, female    DOB: 1970/05/15, 46 y.o.   MRN: [id number]  Ruth Elliott is a pleasant 46 y.o. year old female who presents to clinic today with Follow-up  on 07/18/2016  HPI:  Saw her on 07/09/16 for URI symptoms. Note reviewed.  Given zpack (PCN allergic), pro air and tussionex as needed for cough/wheezing for sinusitis and bronchitis given duration and progression of symptoms.  Here today because she feels the wheezing and sinus pressure are improved but the cough is actually worse.  She had to take a lower dose of the tussionex because it causes dizziness.  Current Outpatient Prescriptions on File Prior to Visit  Medication Sig Dispense Refill  . albuterol (PROVENTIL HFA;VENTOLIN HFA) 108 (90 Base) MCG/ACT inhaler Inhale 2 puffs into the lungs every 6 (six) hours as needed. 1 Inhaler 0  . azithromycin (ZITHROMAX) 250 MG tablet 2 tabs by mouth on day 1 followed by 1 tab by mouth daily days 2-5 6 tablet 0  . B Complex Vitamins (B COMPLEX PO) Take by mouth.    . chlorpheniramine-HYDROcodone (TUSSIONEX PENNKINETIC ER) 10-8 MG/5ML SUER Take 5 mLs by mouth every 12 (twelve) hours as needed. 140 mL 0  . fexofenadine (ALLEGRA ALLERGY) 180 MG tablet Take 180 mg by mouth daily.    . Flax Oil-Fish Oil-Borage Oil (FISH OIL-FLAX OIL-BORAGE OIL) CAPS Take by mouth.    . Misc Natural Products (GLUCOSAMINE CHOND COMPLEX/MSM PO) Take by mouth.    . Multiple Vitamin (MULTIVITAMIN) tablet Take 1 tablet by mouth daily.    . norethindrone-ethinyl estradiol (MICROGESTIN,JUNEL,LOESTRIN) 1-20 MG-MCG tablet Take 1 tablet by mouth daily.  0   No current facility-administered medications on file prior to visit.     Allergies  Allergen Reactions  . Penicillins Shortness Of Breath  . Lisinopril     paresthesia    Past Medical History:  Diagnosis Date  . Allergy   . Endometriosis    Followed by Lynnell Chad- on Nortrel    Past Surgical History:    Procedure Laterality Date  . APPENDECTOMY    . NASAL SINUS SURGERY    . OVARIAN CYST SURGERY    . TONSILLECTOMY  1994    Family History  Problem Relation Age of Onset  . COPD Mother   . Hypertension Mother   . Alzheimer's disease Father   . Hypertension Father     Social History   Social History  . Marital status: Single    Spouse name: N/A  . Number of children: N/A  . Years of education: N/A   Occupational History  . Not on file.   Social History Main Topics  . Smoking status: Never Smoker  . Smokeless tobacco: Never Used  . Alcohol use 0.0 oz/week  . Drug use: No  . Sexual activity: Not on file   Other Topics Concern  . Not on file   Social History Narrative   Works at [employer].  Recently moved here from [place].   G0   The PMH, PSH, Social History, Family History, Medications, and allergies have been reviewed in [facility], and have been updated if relevant.   Review of Systems  Constitutional: Negative.   HENT: Positive for congestion and sore throat. Negative for rhinorrhea, sinus pain, sinus pressure, sneezing, tinnitus, trouble swallowing and voice change.   Respiratory: Positive for cough. Negative for shortness of breath, wheezing and stridor.   Cardiovascular: Negative.   Gastrointestinal: Negative.  Endocrine: Negative.   Genitourinary: Negative.   Musculoskeletal: Negative.   Allergic/Immunologic: Negative.   Neurological: Negative.   Psychiatric/Behavioral: Negative.   All other systems reviewed and are negative.      Objective:    BP (!) 160/100   Pulse 73   Wt 212 lb (96.2 kg)   SpO2 98%   BMI 34.74 kg/m    Physical Exam  Constitutional: She is oriented to person, place, and time. She appears well-developed and well-nourished. No distress.  HENT:  Head: Normocephalic and atraumatic.  Eyes: Conjunctivae are normal.  Cardiovascular: Normal rate and regular rhythm.   Pulmonary/Chest: Effort normal and breath sounds normal. No  respiratory distress. She has no wheezes.  Musculoskeletal: Normal range of motion.  Neurological: She is alert and oriented to person, place, and time. No cranial nerve deficit.  Skin: Skin is warm and dry. She is not diaphoretic.  Psychiatric: She has a normal mood and affect. Her behavior is normal. Judgment and thought content normal.  Nursing note and vitals reviewed.         Assessment & Plan:   Persistent cough No Follow-up on file.

## 2016-07-18 NOTE — Assessment & Plan Note (Signed)
Lung exam reassuring. She does not want any additional cough suppressant. Reassurance provided. Call or return to clinic prn if these symptoms worsen or fail to improve as anticipated. The patient indicates understanding of these issues and agrees with the plan.

## 2016-08-01 ENCOUNTER — Encounter: Payer: Self-pay | Admitting: Family Medicine

## 2016-08-12 ENCOUNTER — Ambulatory Visit (INDEPENDENT_AMBULATORY_CARE_PROVIDER_SITE_OTHER): Payer: Managed Care, Other (non HMO) | Admitting: Family Medicine

## 2016-08-12 ENCOUNTER — Encounter: Payer: Self-pay | Admitting: Family Medicine

## 2016-08-12 VITALS — BP 128/78 | HR 66 | Temp 98.9°F | Ht 65.5 in | Wt 212.5 lb

## 2016-08-12 DIAGNOSIS — Z01419 Encounter for gynecological examination (general) (routine) without abnormal findings: Secondary | ICD-10-CM

## 2016-08-12 DIAGNOSIS — M79674 Pain in right toe(s): Secondary | ICD-10-CM | POA: Diagnosis not present

## 2016-08-12 MED ORDER — DOXYCYCLINE HYCLATE 100 MG PO TABS: 100.0000 mg | ORAL_TABLET | Freq: Two times a day (BID) | ORAL | 0 refills | Status: DC

## 2016-08-12 NOTE — Assessment & Plan Note (Signed)
Consistent with infected corn. Treat with 7 day course of doxycyline.  If symptoms persist, consider tx of the corn at that time. The patient indicates understanding of these issues and agrees with the plan.

## 2016-08-12 NOTE — Progress Notes (Signed)
Subjective:   Patient ID: Ruth Elliott, female    DOB: 47/29/1972, 46 y.o. 06/11/1970, 46 y.o.   MRN: [id number]  Ruth Elliott is a pleasant 46 y.o. year old female who presents to clinic today with Toe Pain (Right little toe)  on 08/12/2016  HPI:  Has had a corn on her right pinky toe for months, maybe longer that has not bothered her.  Past few days, red warm, very tender.  No fevers or chills.  Current Outpatient Prescriptions on File Prior to Visit  Medication Sig Dispense Refill  . B Complex Vitamins (B COMPLEX PO) Take by mouth.    . fexofenadine (ALLEGRA ALLERGY) 180 MG tablet Take 180 mg by mouth daily.    . Flax Oil-Fish Oil-Borage Oil (FISH OIL-FLAX OIL-BORAGE OIL) CAPS Take by mouth.    . Multiple Vitamin (MULTIVITAMIN) tablet Take 1 tablet by mouth daily.    . norethindrone-ethinyl estradiol (MICROGESTIN,JUNEL,LOESTRIN) 1-20 MG-MCG tablet Take 1 tablet by mouth daily.  0   No current facility-administered medications on file prior to visit.     Allergies  Allergen Reactions  . Penicillins Shortness Of Breath  . Lisinopril     paresthesia    Past Medical History:  Diagnosis Date  . Allergy   . Endometriosis    Followed by Lynnell Chad- on Nortrel    Past Surgical History:  Procedure Laterality Date  . APPENDECTOMY    . NASAL SINUS SURGERY    . OVARIAN CYST SURGERY    . TONSILLECTOMY  1994    Family History  Problem Relation Age of Onset  . COPD Mother   . Hypertension Mother   . Alzheimer's disease Father   . Hypertension Father     Social History   Social History  . Marital status: Single    Spouse name: N/A  . Number of children: N/A  . Years of education: N/A   Occupational History  . Not on file.   Social History Main Topics  . Smoking status: Never Smoker  . Smokeless tobacco: Never Used  . Alcohol use 0.0 oz/week  . Drug use: No  . Sexual activity: Not on file   Other Topics Concern  . Not on file   Social History Narrative   Works  at [employer].  Recently moved here from [place].   G0   The PMH, PSH, Social History, Family History, Medications, and allergies have been reviewed in [facility], and have been updated if relevant.   Review of Systems  Constitutional: Negative.   Skin: Positive for color change.  All other systems reviewed and are negative.      Objective:    BP 128/78   Pulse 66   Temp 98.9 F (37.2 C) (Oral)   Ht 5' 5.5" (1.664 m)   Wt 212 lb 8 oz (96.4 kg)   BMI 34.82 kg/m    Physical Exam  Constitutional: She is oriented to person, place, and time. She appears well-developed and well-nourished. No distress.  HENT:  Head: Normocephalic and atraumatic.  Eyes: Conjunctivae are normal.  Cardiovascular: Normal rate.   Pulmonary/Chest: Effort normal.  Musculoskeletal:       Feet:  Neurological: She is alert and oriented to person, place, and time. No cranial nerve deficit.  Skin: She is not diaphoretic.  Psychiatric: She has a normal mood and affect. Her behavior is normal. Judgment and thought content normal.  Nursing note and vitals reviewed.         Assessment & Plan:  Toe pain, right No Follow-up on file.

## 2016-08-19 ENCOUNTER — Other Ambulatory Visit (INDEPENDENT_AMBULATORY_CARE_PROVIDER_SITE_OTHER): Payer: Managed Care, Other (non HMO)

## 2016-08-19 DIAGNOSIS — Z01419 Encounter for gynecological examination (general) (routine) without abnormal findings: Secondary | ICD-10-CM

## 2016-08-21 LAB — COMPREHENSIVE METABOLIC PANEL
A/G RATIO: 1.7 (ref 1.2–2.2)
ALBUMIN: 4.1 g/dL (ref 3.5–5.5)
ALT: 11 IU/L (ref 0–32)
AST: 15 IU/L (ref 0–40)
Alkaline Phosphatase: 59 IU/L (ref 39–117)
BILIRUBIN TOTAL: 0.7 mg/dL (ref 0.0–1.2)
BUN/Creatinine Ratio: 13 (ref 9–23)
BUN: 10 mg/dL (ref 6–24)
CALCIUM: 9 mg/dL (ref 8.7–10.2)
CO2: 20 mmol/L (ref 20–29)
Chloride: 101 mmol/L (ref 96–106)
Creatinine, Ser: 0.75 mg/dL (ref 0.57–1.00)
GFR calc non Af Amer: 96 mL/min/{1.73_m2} (ref >59)
GFR, EST AFRICAN AMERICAN: 111 mL/min/{1.73_m2} (ref >59)
GLOBULIN, TOTAL: 2.4 g/dL (ref 1.5–4.5)
Glucose: 82 mg/dL (ref 65–99)
Potassium: 4.3 mmol/L (ref 3.5–5.2)
SODIUM: 137 mmol/L (ref 134–144)
TOTAL PROTEIN: 6.5 g/dL (ref 6.0–8.5)

## 2016-08-21 LAB — LIPID PANEL
CHOLESTEROL TOTAL: 102 mg/dL (ref 100–199)
Chol/HDL Ratio: 2.8 ratio (ref 0.0–4.4)
HDL: 37 mg/dL — AB (ref >39)
LDL Calculated: 49 mg/dL (ref 0–99)
Triglycerides: 82 mg/dL (ref 0–149)
VLDL Cholesterol Cal: 16 mg/dL (ref 5–40)

## 2016-08-21 LAB — HEMOGLOBIN A1C
Est. average glucose Bld gHb Est-mCnc: 100 mg/dL
HEMOGLOBIN A1C: 5.1 % (ref 4.8–5.6)

## 2016-08-21 LAB — NICOTINE/COTININE METABOLITES
Cotinine: NOT DETECTED ng/mL
NICOTINE: NOT DETECTED ng/mL

## 2016-08-23 ENCOUNTER — Encounter: Payer: Self-pay | Admitting: Family Medicine

## 2016-08-26 ENCOUNTER — Encounter: Payer: Managed Care, Other (non HMO) | Admitting: Family Medicine

## 2016-08-27 ENCOUNTER — Ambulatory Visit (INDEPENDENT_AMBULATORY_CARE_PROVIDER_SITE_OTHER): Payer: Managed Care, Other (non HMO) | Admitting: Family Medicine

## 2016-08-27 ENCOUNTER — Encounter: Payer: Self-pay | Admitting: Family Medicine

## 2016-08-27 VITALS — BP 129/79 | HR 68 | Resp 18 | Ht 65.5 in | Wt 215.0 lb

## 2016-08-27 DIAGNOSIS — E6609 Other obesity due to excess calories: Secondary | ICD-10-CM

## 2016-08-27 DIAGNOSIS — Z0000 Encounter for general adult medical examination without abnormal findings: Secondary | ICD-10-CM | POA: Diagnosis not present

## 2016-08-27 DIAGNOSIS — I10 Essential (primary) hypertension: Secondary | ICD-10-CM

## 2016-08-27 NOTE — Assessment & Plan Note (Signed)
Reviewed preventive care protocols, scheduled due services, and updated immunizations Discussed nutrition, exercise, diet, and healthy lifestyle.  [facility] health screening form completed and returned to patient.

## 2016-08-27 NOTE — Progress Notes (Signed)
Subjective:   Patient ID: Ruth Elliott, female    DOB: Jul 31, 1970, 46 y.o.   MRN: [id number]  Ruth Elliott is a pleasant 46 y.o. year old female who presents to clinic today with Annual Exam  on 08/27/2016  HPI:  Has a GYN, Dr. Genia Harold last pap smear was in 03/2016. Mammogram 03/2016  HTN- H/o white coat HTN.  Continues to work on diet and exercise and has been losing weight.  Checks her BP routinely at home twice daily and mainly ranging in 120s/70s.  Lab Results  Component Value Date   CHOL 102 08/19/2016   HDL 37 (L) 08/19/2016   LDLCALC 49 08/19/2016   TRIG 82 08/19/2016   CHOLHDL 2.8 08/19/2016   Lab Results  Component Value Date   CREATININE 0.75 08/19/2016   Lab Results  Component Value Date   TSH 1.210 07/13/2015   Lab Results  Component Value Date   WBC 10.6 07/13/2015   HGB 13.4 07/13/2015   HCT 40.6 07/13/2015   MCV 99 (H) 07/13/2015   PLT 199 07/13/2015   Lab Results  Component Value Date   ALT 11 08/19/2016   AST 15 08/19/2016   ALKPHOS 59 08/19/2016   BILITOT 0.7 08/19/2016   Lab Results  Component Value Date   NA 137 08/19/2016   K 4.3 08/19/2016   CL 101 08/19/2016   CO2 20 08/19/2016     Wt Readings from Last 3 Encounters:  08/27/16 215 lb (97.5 kg)  08/12/16 212 lb 8 oz (96.4 kg)  07/18/16 212 lb (96.2 kg)     Current Outpatient Prescriptions on File Prior to Visit  Medication Sig Dispense Refill  . B Complex Vitamins (B COMPLEX PO) Take by mouth.    . doxycycline (VIBRA-TABS) 100 MG tablet Take 1 tablet (100 mg total) by mouth 2 (two) times daily. 14 tablet 0  . fexofenadine (ALLEGRA ALLERGY) 180 MG tablet Take 180 mg by mouth daily.    . Flax Oil-Fish Oil-Borage Oil (FISH OIL-FLAX OIL-BORAGE OIL) CAPS Take by mouth.    . Multiple Vitamin (MULTIVITAMIN) tablet Take 1 tablet by mouth daily.    . norethindrone-ethinyl estradiol (MICROGESTIN,JUNEL,LOESTRIN) 1-20 MG-MCG tablet Take 1 tablet by mouth daily.  0   No current  facility-administered medications on file prior to visit.     Allergies  Allergen Reactions  . Penicillins Shortness Of Breath  . Lisinopril     paresthesia    Past Medical History:  Diagnosis Date  . Allergy   . Endometriosis    Followed by Lynnell Chad- on Nortrel    Past Surgical History:  Procedure Laterality Date  . APPENDECTOMY    . NASAL SINUS SURGERY    . OVARIAN CYST SURGERY    . TONSILLECTOMY  1994    Family History  Problem Relation Age of Onset  . COPD Mother   . Hypertension Mother   . Alzheimer's disease Father   . Hypertension Father     Social History   Social History  . Marital status: Single    Spouse name: N/A  . Number of children: N/A  . Years of education: N/A   Occupational History  . Not on file.   Social History Main Topics  . Smoking status: Never Smoker  . Smokeless tobacco: Never Used  . Alcohol use 0.0 oz/week  . Drug use: No  . Sexual activity: Not on file   Other Topics Concern  . Not on file  Social History Narrative   Works at [employer].  Recently moved here from [place].   G0   The PMH, PSH, Social History, Family History, Medications, and allergies have been reviewed in [facility], and have been updated if relevant.   Review of Systems  Constitutional: Negative.   Eyes: Negative.   Respiratory: Negative.   Cardiovascular: Negative.   Musculoskeletal: Negative.   Skin: Negative.   Neurological: Negative.   Hematological: Negative.   Psychiatric/Behavioral: Negative.   All other systems reviewed and are negative.      Objective:    BP 129/79   Pulse 68   Resp 18   Ht 5' 5.5" (1.664 m)   Wt 215 lb (97.5 kg)   SpO2 99%   BMI 35.23 kg/m  Wt Readings from Last 3 Encounters:  08/27/16 215 lb (97.5 kg)  08/12/16 212 lb 8 oz (96.4 kg)  07/18/16 212 lb (96.2 kg)     Physical Exam  Constitutional: She is oriented to person, place, and time. She appears well-developed and well-nourished. No distress.    HENT:  Head: Normocephalic and atraumatic.  Eyes: Conjunctivae are normal.  Cardiovascular: Normal rate and regular rhythm.   Pulmonary/Chest: Effort normal.  Abdominal: Soft. Bowel sounds are normal. She exhibits no distension and no mass. There is no tenderness. There is no rebound and no guarding.  Musculoskeletal: Normal range of motion.  Neurological: She is alert and oriented to person, place, and time. No cranial nerve deficit.  Skin: Skin is warm and dry. She is not diaphoretic.  Psychiatric: Her mood appears anxious.  Nursing note and vitals reviewed.         Assessment & Plan:   Essential hypertension  Obesity due to excess calories, unspecified classification, unspecified whether serious comorbidity present  Well woman exam (no gynecological exam) No Follow-up on file.

## 2016-08-27 NOTE — Assessment & Plan Note (Signed)
Controlled without rx. H/o of white coat HTN- monitors closely at home.

## 2016-09-17 ENCOUNTER — Telehealth: Payer: Self-pay | Admitting: Family Medicine

## 2016-09-17 NOTE — Telephone Encounter (Signed)
Patient walk in/ dropped off [place] for PCP review and completion. Paperwork placed in prescription tower. Best phone# for patient: [phone].

## 2016-09-17 NOTE — Telephone Encounter (Signed)
Place forms on PCP desk to be filled out

## 2016-09-18 NOTE — Telephone Encounter (Signed)
Lmom for patient to collect forms

## 2016-09-18 NOTE — Telephone Encounter (Signed)
Forms completed

## 2016-10-28 ENCOUNTER — Ambulatory Visit: Payer: Self-pay | Admitting: Family Medicine

## 2017-01-13 ENCOUNTER — Ambulatory Visit: Payer: Managed Care, Other (non HMO) | Admitting: Family Medicine

## 2017-01-13 ENCOUNTER — Encounter: Payer: Self-pay | Admitting: Family Medicine

## 2017-01-13 VITALS — BP 136/90 | HR 84 | Temp 99.8°F | Ht 65.5 in | Wt 220.4 lb

## 2017-01-13 DIAGNOSIS — J0100 Acute maxillary sinusitis, unspecified: Secondary | ICD-10-CM | POA: Diagnosis not present

## 2017-01-13 DIAGNOSIS — J310 Chronic rhinitis: Secondary | ICD-10-CM | POA: Diagnosis not present

## 2017-01-13 MED ORDER — AZITHROMYCIN 250 MG PO TABS: ORAL_TABLET | ORAL | 0 refills | Status: DC

## 2017-01-13 MED ORDER — NAPHAZOLINE HCL 0.1 % OP SOLN: 1.0000 [drp] | Freq: Four times a day (QID) | OPHTHALMIC | 0 refills | Status: DC | PRN

## 2017-01-13 NOTE — Progress Notes (Signed)
Subjective:  Patient ID: Ruth Elliott, female    DOB: [birth year]  Age: 46 y.o. MRN: [id number]  CC: Sinus Problem   HPI Hadley L Dileo presents for a 6 day ho facial pressure, yellow rhinorrhea and pnd. Temp is increased today. She has ongoing allergy rhinitis symptoms treated with allegra and daily nasal rinses. She has a ho rad aw sinus infections but denies wheezing. No asthma or smoking history. She is a Engineer, maintenance (IT). allery to pcn. zithromax has worked in the past.   Outpatient Medications Prior to Visit  Medication Sig Dispense Refill  . B Complex Vitamins (B COMPLEX PO) Take by mouth.    . fexofenadine (ALLEGRA ALLERGY) 180 MG tablet Take 180 mg by mouth daily.    . Flax Oil-Fish Oil-Borage Oil (FISH OIL-FLAX OIL-BORAGE OIL) CAPS Take by mouth.    . Multiple Vitamin (MULTIVITAMIN) tablet Take 1 tablet by mouth daily.    . norethindrone-ethinyl estradiol (MICROGESTIN,JUNEL,LOESTRIN) 1-20 MG-MCG tablet Take 1 tablet by mouth daily.  0  . doxycycline (VIBRA-TABS) 100 MG tablet Take 1 tablet (100 mg total) by mouth 2 (two) times daily. 14 tablet 0   No facility-administered medications prior to visit.     ROS Review of Systems  Constitutional: Positive for fatigue. Negative for chills and fever.  HENT: Positive for congestion, facial swelling, postnasal drip, rhinorrhea, sinus pressure, sinus pain and sore throat. Negative for dental problem, trouble swallowing and voice change.   Eyes: Positive for discharge. Negative for photophobia, pain, redness and visual disturbance.  Respiratory: Negative for cough and wheezing.   Cardiovascular: Negative.   Gastrointestinal: Negative.   Musculoskeletal: Negative for arthralgias and myalgias.  Skin: Negative for pallor and rash.  Neurological: Positive for headaches. Negative for weakness.  Hematological: Does not bruise/bleed easily.  Psychiatric/Behavioral: Negative.     Objective:  BP 136/90 (BP Location: Right Arm,  Patient Position: Sitting, Cuff Size: Large)   Pulse 84   Temp 99.8 F (37.7 C) (Oral)   Ht 5' 5.5" (1.664 m)   Wt 220 lb 6 oz (100 kg)   SpO2 97%   BMI 36.11 kg/m   BP Readings from Last 3 Encounters:  01/13/17 136/90  08/27/16 129/79  08/12/16 128/78    Wt Readings from Last 3 Encounters:  01/13/17 220 lb 6 oz (100 kg)  08/27/16 215 lb (97.5 kg)  08/12/16 212 lb 8 oz (96.4 kg)    Physical Exam  Constitutional: She is oriented to person, place, and time. She appears well-developed and well-nourished. No distress.  HENT:  Head: Normocephalic and atraumatic.    Right Ear: External ear normal.  Left Ear: External ear normal.  Mouth/Throat: Oropharynx is clear and moist. No oropharyngeal exudate.  Eyes: Pupils are equal, round, and reactive to light. Right eye exhibits discharge (water). Left eye exhibits discharge (watery). No scleral icterus.  Neck: Normal range of motion. Neck supple. No JVD present. No tracheal deviation present. No thyromegaly present.  Cardiovascular: Normal rate, regular rhythm and normal heart sounds.  Pulmonary/Chest: Effort normal and breath sounds normal.  Lymphadenopathy:    She has no cervical adenopathy.  Neurological: She is alert and oriented to person, place, and time.  Skin: Skin is warm and dry. She is not diaphoretic.  Psychiatric: She has a normal mood and affect. Her behavior is normal.    Lab Results  Component Value Date   WBC 10.6 07/13/2015   HGB 13.4 07/13/2015   HCT 40.6 07/13/2015   PLT 199  07/13/2015   GLUCOSE 82 08/19/2016   CHOL 102 08/19/2016   TRIG 82 08/19/2016   HDL 37 (L) 08/19/2016   LDLCALC 49 08/19/2016   ALT 11 08/19/2016   AST 15 08/19/2016   NA 137 08/19/2016   K 4.3 08/19/2016   CL 101 08/19/2016   CREATININE 0.75 08/19/2016   BUN 10 08/19/2016   CO2 20 08/19/2016   TSH 1.210 07/13/2015   HGBA1C 5.1 08/19/2016    Dg Knee Complete 4 Views Left  Result Date: 05/29/2016 CLINICAL DATA:   46 year old female status post left knee injury. EXAM: LEFT KNEE - COMPLETE 4+ VIEW COMPARISON:  None. FINDINGS: Small suprapatellar joint effusion. Tricompartmental degenerative spurring at the left knee is advanced for age, and associated with mild to moderate patellofemoral and lateral compartment joint space loss. The patella is intact. No acute osseous abnormality identified. 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 Small joint effusion.  No acute osseous abnormality identified. Electronically Signed   By: Genevie Ann M.D.   On: 05/29/2016 13:31    Assessment & Plan:   Kilie was seen today for sinus problem.  Diagnoses and all orders for this visit:  Acute non-recurrent maxillary sinusitis -     azithromycin (ZITHROMAX) 250 MG tablet; Take 2 each day for 3 days.  Chronic rhinitis -     naphazoline (NAPHCON) 0.1 % ophthalmic solution; Place 1 drop into both eyes 4 (four) times daily as needed for eye irritation.   I have discontinued Christeena L. Brull's doxycycline. I am also having her start on azithromycin and naphazoline. Additionally, I am having her maintain her B Complex Vitamins (B COMPLEX PO), multivitamin, Fish Oil-Flax Oil-Borage Oil, norethindrone-ethinyl estradiol, and fexofenadine.   I asked her to hold her Allegra but continue her daily nasal rinses and use Mucinex.  Also recommended Naphcon-A for watery eyes.  She will follow-up if she does not improve within a week.  Meds ordered this encounter  Medications  . azithromycin (ZITHROMAX) 250 MG tablet    Sig: Take 2 each day for 3 days.    Dispense:  6 tablet    Refill:  0  . naphazoline (NAPHCON) 0.1 % ophthalmic solution    Sig: Place 1 drop into both eyes 4 (four) times daily as needed for eye irritation.    Dispense:  15 mL    Refill:  0     Follow-up: No Follow-up on file.  Libby Maw, MD

## 2017-02-03 ENCOUNTER — Encounter: Payer: Self-pay | Admitting: Family Medicine

## 2017-02-03 ENCOUNTER — Ambulatory Visit: Payer: Managed Care, Other (non HMO) | Admitting: Family Medicine

## 2017-02-03 DIAGNOSIS — M25561 Pain in right knee: Secondary | ICD-10-CM | POA: Diagnosis not present

## 2017-02-03 DIAGNOSIS — G8929 Other chronic pain: Secondary | ICD-10-CM | POA: Diagnosis not present

## 2017-02-03 NOTE — Progress Notes (Signed)
Subjective:   Patient ID: Ruth Elliott, female    DOB: 12-06-1970, 47 y.o.   MRN: [id number]  Ruth Elliott is a pleasant 47 y.o. year old female who presents to clinic today with Knee Pain (Patient is here today C/O right knee pain.  Denies any current injury but is unsure if it is related to a quad injury 5-years-ago.  It keeps slipping out of alignment.  Her Chiropractor has had to keep correcting it.  She is wondering if scar tissue has formed causing this to happen.  Going on since October. )  on 02/03/2017  HPI:  Right knee pain- Feels it keeps slipping out of alignment- "my chiropractor keeps correcting it."  This has been ongoing for at least 3 months.  NO known recent injury.  LEFT Knee xray in 05/2016 showed significant tricompartmental degenerative spurring, advanced for age.   CLINICAL DATA:  47 year old female status post left knee injury.  EXAM: LEFT KNEE - COMPLETE 4+ VIEW  COMPARISON:  None.  FINDINGS: Small suprapatellar joint effusion. Tricompartmental degenerative spurring at the left knee is advanced for age, and associated with mild to moderate patellofemoral and lateral compartment joint space loss. The patella is intact. No acute osseous abnormality identified.  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  Small joint effusion.  No acute osseous abnormality identified.  Current Outpatient Medications on File Prior to Visit  Medication Sig Dispense Refill  . B Complex Vitamins (B COMPLEX PO) Take by mouth.    . fexofenadine (ALLEGRA ALLERGY) 180 MG tablet Take 180 mg by mouth daily.    . Flax Oil-Fish Oil-Borage Oil (FISH OIL-FLAX OIL-BORAGE OIL) CAPS Take by mouth.    . Multiple Vitamin (MULTIVITAMIN) tablet Take 1 tablet by mouth daily.    . norethindrone-ethinyl estradiol (MICROGESTIN,JUNEL,LOESTRIN) 1-20 MG-MCG tablet Take 1 tablet by mouth  daily.  0  . naphazoline (NAPHCON) 0.1 % ophthalmic solution Place 1 drop into both eyes 4 (four) times daily as needed for eye irritation. (Patient not taking: Reported on 02/03/2017) 15 mL 0   No current facility-administered medications on file prior to visit.     Allergies  Allergen Reactions  . Penicillins Shortness Of Breath  . Lisinopril     paresthesia    Past Medical History:  Diagnosis Date  . Allergy   . Endometriosis    Followed by Lynnell Chad- on Nortrel    Past Surgical History:  Procedure Laterality Date  . APPENDECTOMY    . NASAL SINUS SURGERY    . OVARIAN CYST SURGERY    . TONSILLECTOMY  1994    Family History  Problem Relation Age of Onset  . COPD Mother   . Hypertension Mother   . Alzheimer's disease Father   . Hypertension Father     Social History   Socioeconomic History  . Marital status: Single    Spouse name: Not on file  . Number of children: Not on file  . Years of education: Not on file  . Highest education level: Not on file  Social Needs  . Financial resource strain: Not on file  . Food insecurity - worry: Not on file  . Food insecurity - inability: Not on file  . Transportation needs - medical: Not on file  . Transportation needs - non-medical: Not on file  Occupational History  . Not on file  Tobacco Use  . Smoking status: Never Smoker  .  Smokeless tobacco: Never Used  Substance and Sexual Activity  . Alcohol use: Yes    Alcohol/week: 0.0 oz  . Drug use: No  . Sexual activity: Not on file  Other Topics Concern  . Not on file  Social History Narrative   Works at [employer].  Recently moved here from [place].   G0   The PMH, PSH, Social History, Family History, Medications, and allergies have been reviewed in [facility], and have been updated if relevant.   Review of Systems  Musculoskeletal: Positive for arthralgias, gait problem and joint swelling. Negative for back pain, myalgias, neck pain and neck stiffness.  All other  systems reviewed and are negative.      Objective:    BP (!) 158/106 (BP Location: Left Arm, Patient Position: Sitting, Cuff Size: Normal)   Pulse 74   Temp 99 F (37.2 C) (Oral)   Ht 5' 5.5" (1.664 m)   Wt 223 lb (101.2 kg)   LMP 01/20/2017   SpO2 98%   BMI 36.54 kg/m    Physical Exam  Constitutional: She is oriented to person, place, and time. She appears well-developed and well-nourished. No distress.  HENT:  Head: Normocephalic and atraumatic.  Eyes: Conjunctivae are normal.  Cardiovascular: Normal rate.  Pulmonary/Chest: Effort normal.  Musculoskeletal:       Right knee: She exhibits normal range of motion, no swelling, no effusion, normal alignment and no LCL laxity. No tenderness found. No medial joint line tenderness noted.  Neurological: She is alert and oriented to person, place, and time. No cranial nerve deficit.  Skin: Skin is warm and dry. She is not diaphoretic.  Psychiatric: She has a normal mood and affect. Her behavior is normal. Judgment and thought content normal.  Nursing note and vitals reviewed.         Assessment & Plan:   Chronic pain of right knee No Follow-up on file.

## 2017-02-03 NOTE — Assessment & Plan Note (Signed)
Intermittent- ? PFM. Given exercises from sports med advisor to [place] her right quad. 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 The patient indicates understanding of these issues and agrees with the plan. Call or return to clinic prn if these symptoms worsen or fail to improve as anticipated.

## 2017-02-10 ENCOUNTER — Encounter: Payer: Self-pay | Admitting: Family Medicine

## 2017-02-10 ENCOUNTER — Ambulatory Visit: Payer: Managed Care, Other (non HMO) | Admitting: Family Medicine

## 2017-02-10 VITALS — BP 146/104 | HR 72 | Temp 99.4°F | Ht 65.5 in | Wt 219.6 lb

## 2017-02-10 DIAGNOSIS — J0191 Acute recurrent sinusitis, unspecified: Secondary | ICD-10-CM

## 2017-02-10 MED ORDER — DOXYCYCLINE HYCLATE 100 MG PO TABS: 100.0000 mg | ORAL_TABLET | Freq: Two times a day (BID) | ORAL | 0 refills | Status: DC

## 2017-02-10 NOTE — Progress Notes (Signed)
SUBJECTIVE:  Ruth Elliott is a 47 y.o. female who complains of coryza, congestion, sneezing, sore throat, fever, productive cough, myalgias, headache and bilateral sinus pain for 6 days. She denies a history of anorexia and chest pain and denies a history of asthma. Patient denies smoke cigarettes.   Current Outpatient Medications on File Prior to Visit  Medication Sig Dispense Refill  . B Complex Vitamins (B COMPLEX PO) Take by mouth.    . fexofenadine (ALLEGRA ALLERGY) 180 MG tablet Take 180 mg by mouth daily.    . Flax Oil-Fish Oil-Borage Oil (FISH OIL-FLAX OIL-BORAGE OIL) CAPS Take by mouth.    . Multiple Vitamin (MULTIVITAMIN) tablet Take 1 tablet by mouth daily.    . naphazoline (NAPHCON) 0.1 % ophthalmic solution Place 1 drop into both eyes 4 (four) times daily as needed for eye irritation. 15 mL 0  . norethindrone-ethinyl estradiol (MICROGESTIN,JUNEL,LOESTRIN) 1-20 MG-MCG tablet Take 1 tablet by mouth daily.  0   No current facility-administered medications on file prior to visit.     Allergies  Allergen Reactions  . Penicillins Shortness Of Breath  . Lisinopril     paresthesia    Past Medical History:  Diagnosis Date  . Allergy   . Endometriosis    Followed by Lynnell Chad- on Nortrel    Past Surgical History:  Procedure Laterality Date  . APPENDECTOMY    . NASAL SINUS SURGERY    . OVARIAN CYST SURGERY    . TONSILLECTOMY  1994    Family History  Problem Relation Age of Onset  . COPD Mother   . Hypertension Mother   . Alzheimer's disease Father   . Hypertension Father     Social History   Socioeconomic History  . Marital status: Single    Spouse name: Not on file  . Number of children: Not on file  . Years of education: Not on file  . Highest education level: Not on file  Social Needs  . Financial resource strain: Not on file  . Food insecurity - worry: Not on file  . Food insecurity - inability: Not on file  . Transportation needs - medical: Not  on file  . Transportation needs - non-medical: Not on file  Occupational History  . Not on file  Tobacco Use  . Smoking status: Never Smoker  . Smokeless tobacco: Never Used  Substance and Sexual Activity  . Alcohol use: Yes    Alcohol/week: 0.0 oz  . Drug use: No  . Sexual activity: Not on file  Other Topics Concern  . Not on file  Social History Narrative   Works at [employer].  Recently moved here from [place].   G0   The PMH, PSH, Social History, Family History, Medications, and allergies have been reviewed in [facility], and have been updated if relevant.  OBJECTIVE: BP (!) 146/104 (BP Location: Left Arm, Patient Position: Sitting, Cuff Size: Normal)   Pulse 72   Temp 99.4 F (37.4 C) (Oral)   Ht 5' 5.5" (1.664 m)   Wt 219 lb 9.6 oz (99.6 kg)   LMP 01/20/2017   SpO2 98%   BMI 35.99 kg/m   She appears well, vital signs are as noted. Ears normal.  Throat and pharynx normal.  Neck supple. No adenopathy in the neck. Nose is congested. Sinuses tender. The chest is clear, without wheezes or rales.  ASSESSMENT:  sinusitis and bronchitis  PLAN: Given duration and progression of symptoms, will treat for bacterial process with doxycyline 100  mg twice daily x 7 days.  Continue allegra, nasocort. Symptomatic therapy suggested: push fluids, rest and return office visit prn if symptoms persist or worsen. Call or return to clinic prn if these symptoms worsen or fail to improve as anticipated.

## 2017-04-14 ENCOUNTER — Ambulatory Visit: Payer: Managed Care, Other (non HMO) | Admitting: Family Medicine

## 2017-04-14 ENCOUNTER — Encounter: Payer: Self-pay | Admitting: Family Medicine

## 2017-04-14 VITALS — BP 138/80 | HR 81 | Temp 98.1°F | Ht 65.5 in | Wt 223.1 lb

## 2017-04-14 DIAGNOSIS — J320 Chronic maxillary sinusitis: Secondary | ICD-10-CM | POA: Diagnosis not present

## 2017-04-14 DIAGNOSIS — H6993 Unspecified Eustachian tube disorder, bilateral: Secondary | ICD-10-CM

## 2017-04-14 DIAGNOSIS — H6983 Other specified disorders of Eustachian tube, bilateral: Secondary | ICD-10-CM | POA: Diagnosis not present

## 2017-04-14 MED ORDER — CLARITHROMYCIN ER 500 MG PO TB24: 1000.0000 mg | ORAL_TABLET | Freq: Every day | ORAL | 0 refills | Status: AC

## 2017-04-14 MED ORDER — FLUTICASONE PROPIONATE 50 MCG/ACT NA SUSP: 2.0000 | Freq: Every day | NASAL | 6 refills | Status: DC

## 2017-04-14 MED ORDER — PREDNISONE 20 MG PO TABS: 20.0000 mg | ORAL_TABLET | Freq: Two times a day (BID) | ORAL | 0 refills | Status: AC

## 2017-04-14 NOTE — Progress Notes (Signed)
Subjective:  Patient ID: Ruth Elliott, female    DOB: 09-02-1970  Age: 47 y.o. MRN: [id number]  CC: Sinusitis   HPI Sonji L Czarnecki presents for evaluation of a 3-week history of nasal congestion postnasal drip facial pain with teeth pain and no rhinorrhea.  There is been no fevers chills or weight loss.  Little cough and no wheezing or reactive airway disease.  Patient has past medical history of chronic sinusitis status post sinus surgery in her distant past.  She has had multiple CTs of her sinuses in the past.  Allergy testing was negative and she was diagnosed with "allergy symptoms".  She totally clear for month or so after her last visit with me.  Penicillins do lead to shortness of breath.  She is taking Allegra for her allergy symptoms but it tends to dry her eyes out and she cannot wear contacts.  Outpatient Medications Prior to Visit  Medication Sig Dispense Refill  . B Complex Vitamins (B COMPLEX PO) Take by mouth.    . fexofenadine (ALLEGRA ALLERGY) 180 MG tablet Take 180 mg by mouth daily.    . Flax Oil-Fish Oil-Borage Oil (FISH OIL-FLAX OIL-BORAGE OIL) CAPS Take by mouth.    . Multiple Vitamin (MULTIVITAMIN) tablet Take 1 tablet by mouth daily.    . norethindrone-ethinyl estradiol (MICROGESTIN,JUNEL,LOESTRIN) 1-20 MG-MCG tablet Take 1 tablet by mouth daily.  0  . naphazoline (NAPHCON) 0.1 % ophthalmic solution Place 1 drop into both eyes 4 (four) times daily as needed for eye irritation. 15 mL 0  . doxycycline (VIBRA-TABS) 100 MG tablet Take 1 tablet (100 mg total) by mouth 2 (two) times daily. 14 tablet 0   No facility-administered medications prior to visit.     ROS Review of Systems  Constitutional: Negative for chills, fatigue and unexpected weight change.  HENT: Positive for congestion, ear pain, postnasal drip, rhinorrhea, sinus pressure and sinus pain.   Eyes: Negative for photophobia, discharge and visual disturbance.  Respiratory: Negative for cough and  wheezing.   Cardiovascular: Negative.   Gastrointestinal: Negative.   Endocrine: Negative for polyphagia and polyuria.  Genitourinary: Negative.   Musculoskeletal: Negative for arthralgias and joint swelling.  Skin: Negative for color change and rash.  Allergic/Immunologic: Negative for immunocompromised state.  Neurological: Negative for weakness and headaches.  Hematological: Does not bruise/bleed easily.  Psychiatric/Behavioral: Negative.     Objective:  BP 138/80 (BP Location: Left Arm, Patient Position: Sitting, Cuff Size: Large)   Pulse 81   Temp 98.1 F (36.7 C) (Oral)   Ht 5' 5.5" (1.664 m)   Wt 223 lb 2 oz (101.2 kg)   SpO2 99%   BMI 36.57 kg/m   BP Readings from Last 3 Encounters:  04/14/17 138/80  02/10/17 (!) 146/104  02/03/17 (!) 158/106    Wt Readings from Last 3 Encounters:  04/14/17 223 lb 2 oz (101.2 kg)  02/10/17 219 lb 9.6 oz (99.6 kg)  02/03/17 223 lb (101.2 kg)    Physical Exam  Constitutional: She is oriented to person, place, and time. She appears well-developed and well-nourished. No distress.  HENT:  Head: Normocephalic and atraumatic.  Right Ear: External ear and ear canal normal. Tympanic membrane is retracted. Tympanic membrane is not injected and not erythematous.  Left Ear: External ear and ear canal normal. Tympanic membrane is retracted. Tympanic membrane is not injected and not erythematous.  Nose: Right sinus exhibits maxillary sinus tenderness. Left sinus exhibits maxillary sinus tenderness.  Mouth/Throat: Oropharynx is clear  and moist. No oropharyngeal exudate.  Eyes: Pupils are equal, round, and reactive to light. Conjunctivae are normal. Right eye exhibits no discharge. Left eye exhibits no discharge. No scleral icterus.  Neck: Neck supple. No JVD present. No tracheal deviation present. No thyromegaly present.  Cardiovascular: Normal rate, regular rhythm and normal heart sounds.  Pulmonary/Chest: Effort normal and breath sounds  normal. No stridor.  Abdominal: Bowel sounds are normal.  Lymphadenopathy:    She has no cervical adenopathy.  Neurological: She is alert and oriented to person, place, and time.  Skin: Skin is warm and dry. She is not diaphoretic.  Psychiatric: She has a normal mood and affect. Her behavior is normal.  Is in the main switch your half name (so he will start this week after is already scheduled to be tomorrow when he had a provider in the office if both take tomorrow afternoon when were so.  We can work for right now that is why gets here in August September will relay and can send him back in the pain if  What is Dr. she has not been here 2 days a week and on Wednesday so is really he he is in years because I will have to look at all of the have any patients and appears more  Lab Results  Component Value Date   WBC 10.6 07/13/2015   HGB 13.4 07/13/2015   HCT 40.6 07/13/2015   PLT 199 07/13/2015   GLUCOSE 82 08/19/2016   CHOL 102 08/19/2016   TRIG 82 08/19/2016   HDL 37 (L) 08/19/2016   LDLCALC 49 08/19/2016   ALT 11 08/19/2016   AST 15 08/19/2016   NA 137 08/19/2016   K 4.3 08/19/2016   CL 101 08/19/2016   CREATININE 0.75 08/19/2016   BUN 10 08/19/2016   CO2 20 08/19/2016   TSH 1.210 07/13/2015   HGBA1C 5.1 08/19/2016    Dg Knee Complete 4 Views Left  Result Date: 05/29/2016 CLINICAL DATA:  47 year old female status post left knee injury. EXAM: LEFT KNEE - COMPLETE 4+ VIEW COMPARISON:  None. FINDINGS: Small suprapatellar joint effusion. Tricompartmental degenerative spurring at the left knee is advanced for age, and associated with mild to moderate patellofemoral and lateral compartment joint space loss. The patella is intact. No acute osseous abnormality identified. 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 Small joint effusion.  No acute osseous abnormality identified. Electronically  Signed   By: Genevie Ann M.D.   On: 05/29/2016 13:31    Assessment & Plan:   Lavida was seen today for sinusitis.  Diagnoses and all orders for this visit:  Maxillary sinusitis, unspecified chronicity -     clarithromycin (BIAXIN XL) 500 MG 24 hr tablet; Take 2 tablets (1,000 mg total) by mouth daily for 10 days. -     fluticasone (FLONASE) 50 MCG/ACT nasal spray; Place 2 sprays into both nostrils daily. -     predniSONE (DELTASONE) 20 MG tablet; Take 1 tablet (20 mg total) by mouth 2 (two) times daily with a meal for 7 days.  Dysfunction of both eustachian tubes -     predniSONE (DELTASONE) 20 MG tablet; Take 1 tablet (20 mg total) by mouth 2 (two) times daily with a meal for 7 days.   I have discontinued Evalynne L. Flippin's naphazoline and doxycycline. I am also having her start on clarithromycin, fluticasone, and predniSONE. Additionally, I am having her maintain her B Complex  Vitamins (B COMPLEX PO), multivitamin, Fish Oil-Flax Oil-Borage Oil, norethindrone-ethinyl estradiol, and fexofenadine.  Meds ordered this encounter  Medications  . clarithromycin (BIAXIN XL) 500 MG 24 hr tablet    Sig: Take 2 tablets (1,000 mg total) by mouth daily for 10 days.    Dispense:  20 tablet    Refill:  0  . fluticasone (FLONASE) 50 MCG/ACT nasal spray    Sig: Place 2 sprays into both nostrils daily.    Dispense:  16 g    Refill:  6  . predniSONE (DELTASONE) 20 MG tablet    Sig: Take 1 tablet (20 mg total) by mouth 2 (two) times daily with a meal for 7 days.    Dispense:  14 tablet    Refill:  0   Advised her that it would be better for her to use a nasal nasal steroid in lieu of the Allegra.  She has taken Flonase in the past without issue.  We will restart that.  Will use a full 10-day course of Biaxin.  She will follow-up in a week if she is not doing better.   Follow-up: Return in about 1 week (around 04/21/2017), or if symptoms worsen or fail to improve.  Libby Maw, MD

## 2017-04-14 NOTE — Patient Instructions (Signed)
Sinusitis, Adult Sinusitis is soreness and inflammation of your sinuses. Sinuses are hollow spaces in the bones around your face. Your sinuses are located:  Around your eyes.  In the middle of your forehead.  Behind your nose.  In your cheekbones.  Your sinuses and nasal passages are lined with a stringy fluid (mucus). Mucus normally drains out of your sinuses. When your nasal tissues become inflamed or swollen, the mucus can become trapped or blocked so air cannot flow through your sinuses. This allows bacteria, viruses, and funguses to grow, which leads to infection. Sinusitis can develop quickly and last for 7?10 days (acute) or for more than 12 weeks (chronic). Sinusitis often develops after a cold. What are the causes? This condition is caused by anything that creates swelling in the sinuses or stops mucus from draining, including:  Allergies.  Asthma.  Bacterial or viral infection.  Abnormally shaped bones between the nasal passages.  Nasal growths that contain mucus (nasal polyps).  Narrow sinus openings.  Pollutants, such as chemicals or irritants in the air.  A foreign object stuck in the nose.  A fungal infection. This is rare.  What increases the risk? The following factors may make you more likely to develop this condition:  Having allergies or asthma.  Having had a recent cold or respiratory tract infection.  Having structural deformities or blockages in your nose or sinuses.  Having a weak immune system.  Doing a lot of swimming or diving.  Overusing nasal sprays.  Smoking.  What are the signs or symptoms? The main symptoms of this condition are pain and a feeling of pressure around the affected sinuses. Other symptoms include:  Upper toothache.  Earache.  Headache.  Bad breath.  Decreased sense of smell and taste.  A cough that may get worse at night.  Fatigue.  Fever.  Thick drainage from your nose. The drainage is often green and  it may contain pus (purulent).  Stuffy nose or congestion.  Postnasal drip. This is when extra mucus collects in the throat or back of the nose.  Swelling and warmth over the affected sinuses.  Sore throat.  Sensitivity to light.  How is this diagnosed? This condition is diagnosed based on symptoms, a medical history, and a physical exam. To find out if your condition is acute or chronic, your health care provider may:  Look in your nose for signs of nasal polyps.  Tap over the affected sinus to check for signs of infection.  View the inside of your sinuses using an imaging device that has a light attached (endoscope).  If your health care provider suspects that you have chronic sinusitis, you may also:  Be tested for allergies.  Have a sample of mucus taken from your nose (nasal culture) and checked for bacteria.  Have a mucus sample examined to see if your sinusitis is related to an allergy.  If your sinusitis does not respond to treatment and it lasts longer than 8 weeks, you may have an MRI or CT scan to check your sinuses. These scans also help to determine how severe your infection is. In rare cases, a bone biopsy may be done to rule out more serious types of fungal sinus disease. How is this treated? Treatment for sinusitis depends on the cause and whether your condition is chronic or acute. If a virus is causing your sinusitis, your symptoms will go away on their own within 10 days. You may be given medicines to relieve your symptoms,   including:  Topical nasal decongestants. They shrink swollen nasal passages and let mucus drain from your sinuses.  Antihistamines. These drugs block inflammation that is triggered by allergies. This can help to ease swelling in your nose and sinuses.  Topical nasal corticosteroids. These are nasal sprays that ease inflammation and swelling in your nose and sinuses.  Nasal saline washes. These rinses can help to get rid of thick mucus in  your nose.  If your condition is caused by bacteria, you will be given an antibiotic medicine. If your condition is caused by a fungus, you will be given an antifungal medicine. Surgery may be needed to correct underlying conditions, such as narrow nasal passages. Surgery may also be needed to remove polyps. Follow these instructions at home: Medicines  Take, use, or apply over-the-counter and prescription medicines only as told by your health care provider. These may include nasal sprays.  If you were prescribed an antibiotic medicine, take it as told by your health care provider. Do not stop taking the antibiotic even if you start to feel better. Hydrate and Humidify  Drink enough water to keep your urine clear or pale yellow. Staying hydrated will help to thin your mucus.  Use a cool mist humidifier to keep the humidity level in your home above 50%.  Inhale steam for 10-15 minutes, 3-4 times a day or as told by your health care provider. You can do this in the bathroom while a hot shower is running.  Limit your exposure to cool or dry air. Rest  Rest as much as possible.  Sleep with your head raised (elevated).  Make sure to get enough sleep each night. General instructions  Apply a warm, moist washcloth to your face 3-4 times a day or as told by your health care provider. This will help with discomfort.  Wash your hands often with soap and water to reduce your exposure to viruses and other germs. If soap and water are not available, use hand sanitizer.  Do not smoke. Avoid being around people who are smoking (secondhand smoke).  Keep all follow-up visits as told by your health care provider. This is important. Contact a health care provider if:  You have a fever.  Your symptoms get worse.  Your symptoms do not improve within 10 days. Get help right away if:  You have a severe headache.  You have persistent vomiting.  You have pain or swelling around your face or  eyes.  You have vision problems.  You develop confusion.  Your neck is stiff.  You have trouble breathing. This information is not intended to replace advice given to you by your health care provider. Make sure you discuss any questions you have with your health care provider. Document Released: 12/31/2004 Document Revised: 08/27/2015 Document Reviewed: 10/26/2014 Elsevier Interactive Patient Education  2018 [place]. Fluticasone nasal spray What is this medicine? FLUTICASONE (floo TIK a sone) is a corticosteroid. This medicine is used to treat the symptoms of allergies like sneezing, itchy red eyes, and itchy, runny, or stuffy nose. This medicine is also used to treat nasal polyps. This medicine may be used for other purposes; ask your health care provider or pharmacist if you have questions. COMMON BRAND NAME(S): Flonase, Flonase Allergy Relief, Flonase Sensimist, Veramyst, XHANCE What should I tell my health care provider before I take this medicine? They need to know if you have any of these conditions: -cataracts -glaucoma -infection, like tuberculosis, herpes, or fungal infection -recent surgery on nose  or sinuses -taking a corticosteroid by mouth -an unusual or allergic reaction to fluticasone, steroids, other medicines, foods, dyes, or preservatives -pregnant or trying to get pregnant -breast-feeding How should I use this medicine? This medicine is for use in the nose. Follow the directions on your product or prescription label. This medicine works best if used at regular intervals. Do not use more often than directed. Make sure that you are using your nasal spray correctly. After 6 months of daily use for allergies, talk to your doctor or health care professional before using it for a longer time. Ask your doctor or health care professional if you have any questions. Talk to your pediatrician regarding the use of this medicine in children. Special care may be needed. Some  products have been used for allergies in children as young as 2 years. After 2 months of daily use without a prescription in a child, talk to your pediatrician before using it for a longer time. Use of this medicine for nasal polyps is not approved in children. Overdosage: If you think you have taken too much of this medicine contact a poison control center or emergency room at once. NOTE: This medicine is only for you. Do not share this medicine with others. What if I miss a dose? If you miss a dose, use it as soon as you remember. If it is almost time for your next dose, use only that dose and continue with your regular schedule. Do not use double or extra doses. What may interact with this medicine? -certain antibiotics like clarithromycin and telithromycin -certain medicines for fungal infections like ketoconazole, itraconazole, and voriconazole -conivaptan -nefazodone -some medicines for HIV -vaccines This list may not describe all possible interactions. Give your health care provider a list of all the medicines, herbs, non-prescription drugs, or dietary supplements you use. Also tell them if you smoke, drink alcohol, or use illegal drugs. Some items may interact with your medicine. What should I watch for while using this medicine? Visit your doctor or health care professional for regular checks on your progress. Some symptoms may improve within 12 hours after starting use. Check with your doctor or health care professional if there is no improvement in your symptoms after 3 weeks of use. This medicine may increase your risk of getting an infection. Tell your doctor or health care professional if you are around anyone with measles or chickenpox, or if you develop sores or blisters that do not heal properly. What side effects may I notice from receiving this medicine? Side effects that you should report to your doctor or health care professional as soon as possible: -allergic reactions like  skin rash, itching or hives, swelling of the face, lips, or tongue -changes in vision -crusting or sores in the nose -nosebleed -signs and symptoms of infection like fever or chills; cough; sore throat -white patches or sores in the mouth or nose Side effects that usually do not require medical attention (report to your doctor or health care professional if they continue or are bothersome): -burning or irritation inside the nose or throat -cough -headache -unusual taste or smell This list may not describe all possible side effects. Call your doctor for medical advice about side effects. You may report side effects to FDA at 1-800-FDA-1088. Where should I keep my medicine? Keep out of the reach of children. Store at room temperature between 15 and 30 degrees C (59 and 86 degrees F). Avoid exposure to extreme heat, cold, or light. Throw  away any unused medicine after the expiration date. NOTE: This sheet is a summary. It may not cover all possible information. If you have questions about this medicine, talk to your doctor, pharmacist, or health care provider.  2018 Elsevier/Gold Standard (2015-10-13 14:23:12)

## 2017-07-21 ENCOUNTER — Ambulatory Visit: Payer: Managed Care, Other (non HMO) | Admitting: Family Medicine

## 2017-07-21 ENCOUNTER — Encounter: Payer: Self-pay | Admitting: Family Medicine

## 2017-07-21 DIAGNOSIS — R1013 Epigastric pain: Secondary | ICD-10-CM | POA: Diagnosis not present

## 2017-07-21 NOTE — Patient Instructions (Signed)
Great to see you. I will call you with your lab results from today and you can view them online.   Try Pepcid 20 mg daily for no more than two weeks without letting me know.

## 2017-07-21 NOTE — Assessment & Plan Note (Signed)
Exam benign and reassuring. Advised her to start H2 blocker for no more than 2 weeks if her symptoms return (currently asymptomatic)- pepcid 20 mg daily. Check labs today- rule out H pylori and pancreatitis. If labs normal and symptoms persist, order RUQ to look at her gallbladder. The patient indicates understanding of these issues and agrees with the plan. Orders Placed This Encounter  Procedures  . H. pylori antibody, IgG  . Lipase  . Comprehensive metabolic panel  . CBC

## 2017-07-21 NOTE — Progress Notes (Signed)
Subjective:   Patient ID: Ruth Elliott, female    DOB: May 01, 1970, 47 y.o.   MRN: [id number]  Ruth Elliott is a pleasant 47 y.o. year old female who presents to clinic today with GI Problem (Patient is here today C/O an upset sotmach.  She states that she had a mild case of food poisoning 6.16.19.  She does not feel that it should linger this long.  She states "it's a mild burn, irritating but not normal" in Left Lumbar Region of abd.  Also has an intermittent sharp twinge in the Right Iliac Region of abd but she states that she gets ovarian cysts.  Appendix was removed.  Denies any diarrhea since the 1st few days.  Denies any N&V.  BM's are normal and are daily. All labs [facility].)  on 07/21/2017  HPI:  Patient is here today C/O an upset sotmach. She states that she had a mild case of food poisoning 6.16.19. She does not feel that it should linger this long. She states "it's a mild burn, irritating but not normal" in Left Lumbar Region of abd. Also has an intermittent sharp twinge in the Right Iliac Region of abd but she states that she gets ovarian cysts. Appendix was removed. Denies any diarrhea since the 1st few days. Denies any N&V. BM's are normal and are daily.  Not taking any rx for it.  Food and drink seem to make it feel better.  Not sure what makes it worse.  No black stools.  Does not wake her up in the middle of the night.  Today she feels better- drank a lot of water over the weekend.  Not currently having any symptoms.  H/o white coat HTN.  Continues to work on diet and exercise and has been losing weight.  Checks her BP routinely at home twice daily and mainly ranging in 120s/70s.   Current Outpatient Medications on File Prior to Visit  Medication Sig Dispense Refill  . B Complex Vitamins (B COMPLEX PO) Take by mouth.    . COLLAGEN PO Take by mouth. Collagen Peptide in coffee in am    . fexofenadine (ALLEGRA ALLERGY) 180 MG tablet Take 180 mg by mouth daily.    .  Flax Oil-Fish Oil-Borage Oil (FISH OIL-FLAX OIL-BORAGE OIL) CAPS Take by mouth.    . fluticasone (FLONASE) 50 MCG/ACT nasal spray Place 2 sprays into both nostrils daily. 16 g 6  . Multiple Vitamin (MULTIVITAMIN) tablet Take 1 tablet by mouth daily.    . norethindrone-ethinyl estradiol (MICROGESTIN,JUNEL,LOESTRIN) 1-20 MG-MCG tablet Take 1 tablet by mouth daily.  0   No current facility-administered medications on file prior to visit.     Allergies  Allergen Reactions  . Penicillins Shortness Of Breath  . Lisinopril     paresthesia    Past Medical History:  Diagnosis Date  . Allergy   . Endometriosis    Followed by Lynnell Chad- on Nortrel    Past Surgical History:  Procedure Laterality Date  . APPENDECTOMY    . NASAL SINUS SURGERY    . OVARIAN CYST SURGERY    . TONSILLECTOMY  1994    Family History  Problem Relation Age of Onset  . COPD Mother   . Hypertension Mother   . Alzheimer's disease Father   . Hypertension Father     Social History   Socioeconomic History  . Marital status: Single    Spouse name: Not on file  . Number of children: Not on  file  . Years of education: Not on file  . Highest education level: Not on file  Occupational History  . Not on file  Social Needs  . Financial resource strain: Not on file  . Food insecurity:    Worry: Not on file    Inability: Not on file  . Transportation needs:    Medical: Not on file    Non-medical: Not on file  Tobacco Use  . Smoking status: Never Smoker  . Smokeless tobacco: Never Used  Substance and Sexual Activity  . Alcohol use: Yes    Alcohol/week: 0.0 oz  . Drug use: No  . Sexual activity: Not on file  Lifestyle  . Physical activity:    Days per week: Not on file    Minutes per session: Not on file  . Stress: Not on file  Relationships  . Social connections:    Talks on phone: Not on file    Gets together: Not on file    Attends religious service: Not on file    Active member of club or  organization: Not on file    Attends meetings of clubs or organizations: Not on file    Relationship status: Not on file  . Intimate partner violence:    Fear of current or ex partner: Not on file    Emotionally abused: Not on file    Physically abused: Not on file    Forced sexual activity: Not on file  Other Topics Concern  . Not on file  Social History Narrative   Works at [employer].  Recently moved here from [place].   G0   The PMH, PSH, Social History, Family History, Medications, and allergies have been reviewed in [facility], and have been updated if relevant.   Review of Systems  Constitutional: Negative.   HENT: Negative.   Eyes: Negative.   Respiratory: Negative.   Cardiovascular: Negative.   Gastrointestinal: Positive for abdominal pain. Negative for abdominal distention, anal bleeding, blood in stool, constipation, diarrhea, nausea, rectal pain and vomiting.  Endocrine: Negative.   Genitourinary: Negative.   Musculoskeletal: Negative.   Skin: Negative.   Allergic/Immunologic: Negative.   Neurological: Negative.   Hematological: Negative.   Psychiatric/Behavioral: Negative.   All other systems reviewed and are negative.      Objective:    BP (!) 178/112 (BP Location: Right Arm, Cuff Size: Normal)   Pulse 78   Temp 99.4 F (37.4 C) (Oral)   Ht 5\' 6"  (1.676 m)   Wt 221 lb (100.2 kg)   SpO2 97%   BMI 35.67 kg/m    Physical Exam  Constitutional: She is oriented to person, place, and time. She appears well-developed and well-nourished. No distress.  HENT:  Head: Normocephalic and atraumatic.  Eyes: EOM are normal.  Cardiovascular: Normal rate.  Pulmonary/Chest: Effort normal.  Abdominal: Soft. Bowel sounds are normal. She exhibits no distension and no mass. There is no tenderness. There is no rebound and no guarding. No hernia.  Musculoskeletal: Normal range of motion.  Neurological: She is alert and oriented to person, place, and time. No cranial nerve  deficit.  Skin: Skin is warm and dry. She is not diaphoretic.  Psychiatric: She has a normal mood and affect. Her behavior is normal. Judgment and thought content normal.  Nursing note and vitals reviewed.        Assessment & Plan:   Epigastric pain No follow-ups on file.

## 2017-07-21 NOTE — Progress Notes (Signed)
0

## 2017-07-22 LAB — CBC
HEMOGLOBIN: 15 g/dL (ref 11.1–15.9)
Hematocrit: 46.3 % (ref 34.0–46.6)
MCH: 32.5 pg (ref 26.6–33.0)
MCHC: 32.4 g/dL (ref 31.5–35.7)
MCV: 100 fL — ABNORMAL HIGH (ref 79–97)
PLATELETS: 207 10*3/uL (ref 150–450)
RBC: 4.62 x10E6/uL (ref 3.77–5.28)
RDW: 13.1 % (ref 12.3–15.4)
WBC: 10.9 10*3/uL — ABNORMAL HIGH (ref 3.4–10.8)

## 2017-07-22 LAB — COMPREHENSIVE METABOLIC PANEL
A/G RATIO: 1.7 (ref 1.2–2.2)
ALT: 16 IU/L (ref 0–32)
AST: 20 IU/L (ref 0–40)
Albumin: 4.2 g/dL (ref 3.5–5.5)
Alkaline Phosphatase: 61 IU/L (ref 39–117)
BILIRUBIN TOTAL: 0.7 mg/dL (ref 0.0–1.2)
BUN/Creatinine Ratio: 15 (ref 9–23)
BUN: 11 mg/dL (ref 6–24)
CHLORIDE: 106 mmol/L (ref 96–106)
CO2: 21 mmol/L (ref 20–29)
Calcium: 9.2 mg/dL (ref 8.7–10.2)
Creatinine, Ser: 0.72 mg/dL (ref 0.57–1.00)
GFR, EST AFRICAN AMERICAN: 115 mL/min/{1.73_m2} (ref >59)
GFR, EST NON AFRICAN AMERICAN: 100 mL/min/{1.73_m2} (ref >59)
GLOBULIN, TOTAL: 2.5 g/dL (ref 1.5–4.5)
Glucose: 94 mg/dL (ref 65–99)
POTASSIUM: 4 mmol/L (ref 3.5–5.2)
SODIUM: 145 mmol/L — AB (ref 134–144)
TOTAL PROTEIN: 6.7 g/dL (ref 6.0–8.5)

## 2017-07-22 LAB — LIPASE: LIPASE: 22 U/L (ref 14–72)

## 2017-07-22 LAB — H. PYLORI ANTIBODY, IGG

## 2017-07-24 ENCOUNTER — Encounter: Payer: Self-pay | Admitting: Family Medicine

## 2017-07-30 ENCOUNTER — Ambulatory Visit: Payer: Managed Care, Other (non HMO) | Admitting: Family Medicine

## 2017-07-30 ENCOUNTER — Encounter: Payer: Self-pay | Admitting: Family Medicine

## 2017-07-30 VITALS — BP 138/88 | HR 73 | Temp 99.5°F | Ht 66.0 in | Wt 220.0 lb

## 2017-07-30 DIAGNOSIS — I10 Essential (primary) hypertension: Secondary | ICD-10-CM | POA: Diagnosis not present

## 2017-07-30 DIAGNOSIS — J019 Acute sinusitis, unspecified: Secondary | ICD-10-CM | POA: Diagnosis not present

## 2017-07-30 MED ORDER — DOXYCYCLINE HYCLATE 100 MG PO TABS: 100.0000 mg | ORAL_TABLET | Freq: Two times a day (BID) | ORAL | 0 refills | Status: DC

## 2017-07-30 NOTE — Assessment & Plan Note (Signed)
Given duration and progression of symptoms, will treat for bacterial sinusitis with doxycyline, restart allegra (without decongestant), continue flonase. Call or return to clinic prn if these symptoms worsen or fail to improve as anticipated. The patient indicates understanding of these issues and agrees with the plan.

## 2017-07-30 NOTE — Patient Instructions (Signed)
Great to see you. Restart Allegra.  Take doxycyline 100 mg twice daily for 7 days.  Have a great time in [place]!

## 2017-07-30 NOTE — Assessment & Plan Note (Signed)
Deteriorated this morning but Normotensive here.  Reassurance provided. ? If she needs her home BP cuff recalibrated. She will do that. Call or return to clinic prn if these symptoms worsen or fail to improve as anticipated. The patient indicates understanding of these issues and agrees with the plan.

## 2017-07-30 NOTE — Progress Notes (Signed)
Subjective:   Patient ID: Ruth Elliott, female    DOB: 20-Jul-1970, 47 y.o.   MRN: [id number]  Ruth Elliott is a pleasant 47 y.o. year old female who presents to clinic today with Hypertension (Patient is here today C/O elevated BP. She feels that it may be elevated due to the sinus issues.  It was high this am was 150/102 then 140/90.) and Sinusitis (Patient is also C/O sinus pressure.  She states "my face feels like it's going to explode."  Productive cough since Monday with yellow sputum.  Nasal passages are stopped up.  Pressure is frontal and periorbital and severe in nature.  )  on 07/30/2017  HPI:  ? Sinus infection- facial pressure, temperature, worsening cough for over week, acutely worse over past 3 days.  Cough is now productive of yellow sputum.  Still taking flonase but not allegra.  BP was elevated this morning- as high as 150/102. Much better today. No blurred vision, CP, SOB or LE edema.  Current Outpatient Medications on File Prior to Visit  Medication Sig Dispense Refill  . B Complex Vitamins (B COMPLEX PO) Take by mouth.    . COLLAGEN PO Take by mouth. Collagen Peptide in coffee in am    . Flax Oil-Fish Oil-Borage Oil (FISH OIL-FLAX OIL-BORAGE OIL) CAPS Take by mouth.    . fluticasone (FLONASE) 50 MCG/ACT nasal spray Place 2 sprays into both nostrils daily. 16 g 6  . Multiple Vitamin (MULTIVITAMIN) tablet Take 1 tablet by mouth daily.    . norethindrone-ethinyl estradiol (MICROGESTIN,JUNEL,LOESTRIN) 1-20 MG-MCG tablet Take 1 tablet by mouth daily.  0  . fexofenadine (ALLEGRA ALLERGY) 180 MG tablet Take 180 mg by mouth daily.     No current facility-administered medications on file prior to visit.     Allergies  Allergen Reactions  . Penicillins Shortness Of Breath  . Lisinopril     paresthesia    Past Medical History:  Diagnosis Date  . Allergy   . Endometriosis    Followed by Lynnell Chad- on Nortrel    Past Surgical History:  Procedure  Laterality Date  . APPENDECTOMY    . NASAL SINUS SURGERY    . OVARIAN CYST SURGERY    . TONSILLECTOMY  1994    Family History  Problem Relation Age of Onset  . COPD Mother   . Hypertension Mother   . Alzheimer's disease Father   . Hypertension Father     Social History   Socioeconomic History  . Marital status: Single    Spouse name: Not on file  . Number of children: Not on file  . Years of education: Not on file  . Highest education level: Not on file  Occupational History  . Not on file  Social Needs  . Financial resource strain: Not on file  . Food insecurity:    Worry: Not on file    Inability: Not on file  . Transportation needs:    Medical: Not on file    Non-medical: Not on file  Tobacco Use  . Smoking status: Never Smoker  . Smokeless tobacco: Never Used  Substance and Sexual Activity  . Alcohol use: Yes    Alcohol/week: 0.0 oz  . Drug use: No  . Sexual activity: Not on file  Lifestyle  . Physical activity:    Days per week: Not on file    Minutes per session: Not on file  . Stress: Not on file  Relationships  . Social connections:  Talks on phone: Not on file    Gets together: Not on file    Attends religious service: Not on file    Active member of club or organization: Not on file    Attends meetings of clubs or organizations: Not on file    Relationship status: Not on file  . Intimate partner violence:    Fear of current or ex partner: Not on file    Emotionally abused: Not on file    Physically abused: Not on file    Forced sexual activity: Not on file  Other Topics Concern  . Not on file  Social History Narrative   Works at [employer].  Recently moved here from [place].   G0   The PMH, PSH, Social History, Family History, Medications, and allergies have been reviewed in [facility], and have been updated if relevant.   Review of Systems  Constitutional: Positive for fatigue and fever.  HENT: Positive for congestion, postnasal drip, sinus  pressure and sinus pain. Negative for ear pain, facial swelling, hearing loss, mouth sores, nosebleeds, rhinorrhea, sneezing and sore throat.   Respiratory: Positive for cough.   Neurological: Negative.   Hematological: Negative.   Psychiatric/Behavioral: Negative.   All other systems reviewed and are negative.      Objective:    BP 138/88 (BP Location: Left Arm, Cuff Size: Normal)   Pulse 73   Temp 99.5 F (37.5 C) (Oral)   Ht 5\' 6"  (1.676 m)   Wt 220 lb (99.8 kg)   SpO2 97%   BMI 35.51 kg/m    Physical Exam  Constitutional: She is oriented to person, place, and time. She appears well-developed and well-nourished. No distress.  HENT:  Head: Normocephalic and atraumatic.  Right Ear: Hearing and tympanic membrane normal.  Left Ear: Hearing and tympanic membrane normal.  Nose: Right sinus exhibits maxillary sinus tenderness. Left sinus exhibits maxillary sinus tenderness.  Mouth/Throat: Uvula is midline.  Cardiovascular: Normal rate and regular rhythm.  Pulmonary/Chest: Effort normal and breath sounds normal.  Musculoskeletal: Normal range of motion.  Neurological: She is alert and oriented to person, place, and time. No cranial nerve deficit.  Skin: Skin is warm and dry. She is not diaphoretic.  Psychiatric: She has a normal mood and affect. Her behavior is normal. Judgment and thought content normal.  Nursing note and vitals reviewed.         Assessment & Plan:   Essential hypertension No follow-ups on file.

## 2017-08-18 ENCOUNTER — Encounter: Payer: Self-pay | Admitting: Family Medicine

## 2017-08-18 ENCOUNTER — Ambulatory Visit (INDEPENDENT_AMBULATORY_CARE_PROVIDER_SITE_OTHER): Payer: Managed Care, Other (non HMO) | Admitting: Family Medicine

## 2017-08-18 VITALS — BP 130/86 | HR 74 | Temp 99.1°F | Ht 66.0 in | Wt 221.2 lb

## 2017-08-18 DIAGNOSIS — J019 Acute sinusitis, unspecified: Secondary | ICD-10-CM

## 2017-08-18 DIAGNOSIS — J302 Other seasonal allergic rhinitis: Secondary | ICD-10-CM

## 2017-08-18 DIAGNOSIS — I10 Essential (primary) hypertension: Secondary | ICD-10-CM

## 2017-08-18 DIAGNOSIS — Z0000 Encounter for general adult medical examination without abnormal findings: Secondary | ICD-10-CM

## 2017-08-18 DIAGNOSIS — J309 Allergic rhinitis, unspecified: Secondary | ICD-10-CM | POA: Insufficient documentation

## 2017-08-18 MED ORDER — LEVOCETIRIZINE DIHYDROCHLORIDE 5 MG PO TABS: 5.0000 mg | ORAL_TABLET | Freq: Every evening | ORAL | 3 refills | Status: DC

## 2017-08-18 NOTE — Assessment & Plan Note (Signed)
Well controlled. No changes made to rxs. 

## 2017-08-18 NOTE — Assessment & Plan Note (Signed)
Deteriorated.  Cannot tolerate singulair. D/c zyrtec. eRx sent for xyzal, continue nasocort. Call or return to clinic prn if these symptoms worsen or fail to improve as anticipated. The patient indicates understanding of these issues and agrees with the plan.

## 2017-08-18 NOTE — Assessment & Plan Note (Signed)
Those symptoms seem to have improved s/p doxycyline.

## 2017-08-18 NOTE — Progress Notes (Signed)
Subjective:   Patient ID: Ruth Elliott, female    DOB: Jun 02, 1970, 47 y.o.   MRN: [id number]  Ruth Elliott is a pleasant 47 y.o. year old female who presents to clinic today with Annual Exam (Patient is here today for a CPE.  Next PAP due on 3.10.2022.  Immunizations are UTD.  Last Mammogram was in March WNL completed at GYN office.  She is currently fasting and all labs must go to [facility]. She states that she feels like she has a sinus infection.  Has had to use the inhaler the last 2 weeks. Periorbital sinus pressure. Has been trying Zyrtec. )  on 08/18/2017  HPI:  Health Maintenance  Topic Date Due  . INFLUENZA VACCINE  08/14/2017  . TETANUS/TDAP  05/26/2019  . PAP SMEAR  03/23/2020  . HIV Screening  Completed   Has GYN- mammogram done in March, next pap smear due in 03/2020.  Sinus infection- treated with doxycyline on 07/30/17.  Advised restarting allegra and to continue flonase.  She stopped taking allegra and started zyrtec since the allegra has not helped.  She is taking nasocort.  Has had to use her inhaler several times over the last two weeks but not in the last 5 days. She is still having some periorbital sinus pressure, although that has improved. Having persistent nasal drainage.  And just feels "congested." Cannot tolerate singulair (makes her [place]).    Current Outpatient Medications on File Prior to Visit  Medication Sig Dispense Refill  . B Complex Vitamins (B COMPLEX PO) Take by mouth.    . COLLAGEN PO Take by mouth. Collagen Peptide in coffee in am    . fexofenadine (ALLEGRA ALLERGY) 180 MG tablet Take 180 mg by mouth daily.    . Flax Oil-Fish Oil-Borage Oil (FISH OIL-FLAX OIL-BORAGE OIL) CAPS Take by mouth.    . fluticasone (FLONASE) 50 MCG/ACT nasal spray Place 2 sprays into both nostrils daily. 16 g 6  . Multiple Vitamin (MULTIVITAMIN) tablet Take 1 tablet by mouth daily.    . norethindrone-ethinyl estradiol (MICROGESTIN,JUNEL,LOESTRIN) 1-20 MG-MCG  tablet Take 1 tablet by mouth daily.  0   No current facility-administered medications on file prior to visit.     Allergies  Allergen Reactions  . Penicillins Shortness Of Breath  . Lisinopril     paresthesia  . Singulair [Montelukast Sodium]     [place]    Past Medical History:  Diagnosis Date  . Allergy   . Endometriosis    Followed by Lynnell Chad- on Nortrel    Past Surgical History:  Procedure Laterality Date  . APPENDECTOMY    . NASAL SINUS SURGERY    . OVARIAN CYST SURGERY    . TONSILLECTOMY  1994    Family History  Problem Relation Age of Onset  . COPD Mother   . Hypertension Mother   . Alzheimer's disease Father   . Hypertension Father     Social History   Socioeconomic History  . Marital status: Single    Spouse name: Not on file  . Number of children: Not on file  . Years of education: Not on file  . Highest education level: Not on file  Occupational History  . Not on file  Social Needs  . Financial resource strain: Not on file  . Food insecurity:    Worry: Not on file    Inability: Not on file  . Transportation needs:    Medical: Not on file    Non-medical:  Not on file  Tobacco Use  . Smoking status: Never Smoker  . Smokeless tobacco: Never Used  Substance and Sexual Activity  . Alcohol use: Yes    Alcohol/week: 0.0 oz  . Drug use: No  . Sexual activity: Not on file  Lifestyle  . Physical activity:    Days per week: Not on file    Minutes per session: Not on file  . Stress: Not on file  Relationships  . Social connections:    Talks on phone: Not on file    Gets together: Not on file    Attends religious service: Not on file    Active member of club or organization: Not on file    Attends meetings of clubs or organizations: Not on file    Relationship status: Not on file  . Intimate partner violence:    Fear of current or ex partner: Not on file    Emotionally abused: Not on file    Physically abused: Not on file    Forced  sexual activity: Not on file  Other Topics Concern  . Not on file  Social History Narrative   Works at [employer].  Recently moved here from [place].   G0   The PMH, PSH, Social History, Family History, Medications, and allergies have been reviewed in [facility], and have been updated if relevant.   Review of Systems  Constitutional: Negative.   HENT: Positive for sinus pressure.   Eyes: Negative.   Respiratory: Positive for wheezing. Negative for shortness of breath.   Cardiovascular: Negative.   Gastrointestinal: Negative.   Endocrine: Negative.   Genitourinary: Negative.   Musculoskeletal: Negative.   Allergic/Immunologic: Positive for environmental allergies.  Neurological: Negative.   Hematological: Negative.   Psychiatric/Behavioral: Negative.   All other systems reviewed and are negative.      Objective:    BP 130/86 (BP Location: Left Arm, Cuff Size: Large)   Pulse 74   Temp 99.1 F (37.3 C) (Oral)   Ht 5\' 6"  (1.676 m)   Wt 221 lb 3.2 oz (100.3 kg)   SpO2 98%   BMI 35.70 kg/m    Physical Exam  Constitutional: She is oriented to person, place, and time. She appears well-developed and well-nourished. No distress.  HENT:  Head: Normocephalic.  Right Ear: Hearing and tympanic membrane normal.  Left Ear: Hearing and tympanic membrane normal.  Nose: Mucosal edema present. Right sinus exhibits no maxillary sinus tenderness and no frontal sinus tenderness. Left sinus exhibits no maxillary sinus tenderness and no frontal sinus tenderness.  Eyes: Pupils are equal, round, and reactive to light. EOM are normal.  Neck: Normal range of motion. Neck supple.  Cardiovascular: Normal rate and regular rhythm.  Pulmonary/Chest: Effort normal and breath sounds normal.  Abdominal: Soft. Bowel sounds are normal.  Musculoskeletal: Normal range of motion. She exhibits no edema.  Neurological: She is alert and oriented to person, place, and time. No cranial nerve deficit.  Skin: Skin is  warm and dry. She is not diaphoretic.  Psychiatric: She has a normal mood and affect. Her behavior is normal. Judgment and thought content normal.  Nursing note and vitals reviewed.         Assessment & Plan:   Well woman exam without gynecological exam - Plan: CBC with Differential/Platelet, Comprehensive metabolic panel, Lipid panel, TSH  Essential hypertension  Acute sinusitis, recurrence not specified, unspecified location No follow-ups on file.

## 2017-08-18 NOTE — Assessment & Plan Note (Signed)
Reviewed preventive care protocols, scheduled due services, and updated immunizations Discussed nutrition, exercise, diet, and healthy lifestyle.  Orders Placed This Encounter  Procedures  . CBC with Differential/Platelet  . Comprehensive metabolic panel  . Lipid panel  . TSH     

## 2017-08-18 NOTE — Patient Instructions (Addendum)
Great to see you. I will call you with your lab results from today and you can view them online.   Please STOP taking zyrtec.  Start xyzal, continue nasocort.  Keep me updated.

## 2017-08-20 LAB — COMPREHENSIVE METABOLIC PANEL
ALBUMIN: 4.3 g/dL (ref 3.5–5.5)
ALT: 10 IU/L (ref 0–32)
AST: 15 IU/L (ref 0–40)
Albumin/Globulin Ratio: 1.8 (ref 1.2–2.2)
Alkaline Phosphatase: 55 IU/L (ref 39–117)
BUN / CREAT RATIO: 15 (ref 9–23)
BUN: 9 mg/dL (ref 6–24)
Bilirubin Total: 0.9 mg/dL (ref 0.0–1.2)
CO2: 20 mmol/L (ref 20–29)
CREATININE: 0.62 mg/dL (ref 0.57–1.00)
Calcium: 8.6 mg/dL — ABNORMAL LOW (ref 8.7–10.2)
Chloride: 105 mmol/L (ref 96–106)
GFR calc non Af Amer: 108 mL/min/{1.73_m2} (ref >59)
GFR, EST AFRICAN AMERICAN: 124 mL/min/{1.73_m2} (ref >59)
Globulin, Total: 2.4 g/dL (ref 1.5–4.5)
Glucose: 93 mg/dL (ref 65–99)
Potassium: 3.6 mmol/L (ref 3.5–5.2)
Sodium: 140 mmol/L (ref 134–144)
TOTAL PROTEIN: 6.7 g/dL (ref 6.0–8.5)

## 2017-08-20 LAB — NICOTINE/COTININE METABOLITES
COTININE: NOT DETECTED ng/mL
Nicotine: NOT DETECTED ng/mL

## 2017-08-20 LAB — CBC WITH DIFFERENTIAL/PLATELET
Basophils Absolute: 0 10*3/uL (ref 0.0–0.2)
Basos: 0 %
EOS (ABSOLUTE): 0.2 10*3/uL (ref 0.0–0.4)
Eos: 2 %
HEMOGLOBIN: 14.6 g/dL (ref 11.1–15.9)
Hematocrit: 42.8 % (ref 34.0–46.6)
IMMATURE GRANS (ABS): 0 10*3/uL (ref 0.0–0.1)
IMMATURE GRANULOCYTES: 0 %
Lymphocytes Absolute: 3.1 10*3/uL (ref 0.7–3.1)
Lymphs: 29 %
MCH: 33.3 pg — ABNORMAL HIGH (ref 26.6–33.0)
MCHC: 34.1 g/dL (ref 31.5–35.7)
MCV: 98 fL — ABNORMAL HIGH (ref 79–97)
Monocytes Absolute: 0.5 10*3/uL (ref 0.1–0.9)
Monocytes: 5 %
NEUTROS PCT: 64 %
Neutrophils Absolute: 7.2 10*3/uL — ABNORMAL HIGH (ref 1.4–7.0)
PLATELETS: 182 10*3/uL (ref 150–450)
RBC: 4.38 x10E6/uL (ref 3.77–5.28)
RDW: 12.9 % (ref 12.3–15.4)
WBC: 11 10*3/uL — ABNORMAL HIGH (ref 3.4–10.8)

## 2017-08-20 LAB — TSH: TSH: 1.53 u[IU]/mL (ref 0.450–4.500)

## 2017-08-20 LAB — LIPID PANEL
CHOLESTEROL TOTAL: 99 mg/dL — AB (ref 100–199)
Chol/HDL Ratio: 2.8 ratio (ref 0.0–4.4)
HDL: 36 mg/dL — AB (ref >39)
LDL CALC: 44 mg/dL (ref 0–99)
Triglycerides: 96 mg/dL (ref 0–149)
VLDL CHOLESTEROL CAL: 19 mg/dL (ref 5–40)

## 2017-08-20 LAB — HEMOGLOBIN A1C
Est. average glucose Bld gHb Est-mCnc: 108 mg/dL
HEMOGLOBIN A1C: 5.4 % (ref 4.8–5.6)

## 2017-08-28 ENCOUNTER — Encounter: Payer: Managed Care, Other (non HMO) | Admitting: Family Medicine

## 2017-09-22 ENCOUNTER — Ambulatory Visit: Payer: Managed Care, Other (non HMO) | Admitting: Family Medicine

## 2017-09-22 ENCOUNTER — Encounter: Payer: Self-pay | Admitting: Family Medicine

## 2017-09-22 VITALS — BP 136/82 | HR 79 | Temp 99.5°F | Ht 66.0 in | Wt 213.8 lb

## 2017-09-22 DIAGNOSIS — J301 Allergic rhinitis due to pollen: Secondary | ICD-10-CM | POA: Diagnosis not present

## 2017-09-22 DIAGNOSIS — E6609 Other obesity due to excess calories: Secondary | ICD-10-CM | POA: Diagnosis not present

## 2017-09-22 DIAGNOSIS — I10 Essential (primary) hypertension: Secondary | ICD-10-CM

## 2017-09-22 NOTE — Assessment & Plan Note (Signed)
Doing well on current medications. No changes made. She will continue to update me.

## 2017-09-22 NOTE — Progress Notes (Signed)
Subjective:   Patient ID: Ruth Elliott, female    DOB: 1970-08-30, 47 y.o.   MRN: [id number]  Ruth Elliott is a pleasant 47 y.o. year old female who presents to clinic today with Follow-up (Patient is here today to F/U from e-health screening.  She would also like to discuss the Rx antihistamines from last time. She is feeling much better.  She declines flu shot as she will get it from work at no charge.)  on 09/22/2017  HPI:  Doing well.  Has changed her diet since her brother was diagnosed with diabetes a few weeks ago.  She has already lost 8 pounds.  Wt Readings from Last 3 Encounters:  09/22/17 213 lb 12.8 oz (97 kg)  09/22/17 213 lb 12.8 oz (97 kg)  08/18/17 221 lb 3.2 oz (100.3 kg)   She feels xyzal is currently working well for her but when it was cooler last week, she went back to [employer] as she felt the xzyal was "too strong."  Her allergies and asthma are worse with heat and humidity.  She has been taking zyxal again this week.  Current Outpatient Medications on File Prior to Visit  Medication Sig Dispense Refill  . B Complex Vitamins (B COMPLEX PO) Take by mouth.    . COLLAGEN PO Take by mouth. Collagen Peptide in coffee in am    . fexofenadine (ALLEGRA ALLERGY) 180 MG tablet Take 180 mg by mouth daily.    . Flax Oil-Fish Oil-Borage Oil (FISH OIL-FLAX OIL-BORAGE OIL) CAPS Take by mouth.    . fluticasone (FLONASE) 50 MCG/ACT nasal spray Place 2 sprays into both nostrils daily. 16 g 6  . levocetirizine (XYZAL) 5 MG tablet Take 1 tablet (5 mg total) by mouth every evening. 30 tablet 3  . Multiple Vitamin (MULTIVITAMIN) tablet Take 1 tablet by mouth daily.    . norethindrone-ethinyl estradiol (MICROGESTIN,JUNEL,LOESTRIN) 1-20 MG-MCG tablet Take 1 tablet by mouth daily.  0   No current facility-administered medications on file prior to visit.     Allergies  Allergen Reactions  . Penicillins Shortness Of Breath  . Lisinopril     paresthesia  . Singulair [Montelukast  Sodium]     Moody    Past Medical History:  Diagnosis Date  . Allergy   . Endometriosis    Followed by Lynnell Chad- on Nortrel    Past Surgical History:  Procedure Laterality Date  . APPENDECTOMY    . NASAL SINUS SURGERY    . OVARIAN CYST SURGERY    . TONSILLECTOMY  1994    Family History  Problem Relation Age of Onset  . COPD Mother   . Hypertension Mother   . Alzheimer's disease Father   . Hypertension Father     Social History   Socioeconomic History  . Marital status: Single    Spouse name: Not on file  . Number of children: Not on file  . Years of education: Not on file  . Highest education level: Not on file  Occupational History  . Not on file  Social Needs  . Financial resource strain: Not on file  . Food insecurity:    Worry: Not on file    Inability: Not on file  . Transportation needs:    Medical: Not on file    Non-medical: Not on file  Tobacco Use  . Smoking status: Never Smoker  . Smokeless tobacco: Never Used  Substance and Sexual Activity  . Alcohol use: Yes  Alcohol/week: 0.0 standard drinks  . Drug use: No  . Sexual activity: Not on file  Lifestyle  . Physical activity:    Days per week: Not on file    Minutes per session: Not on file  . Stress: Not on file  Relationships  . Social connections:    Talks on phone: Not on file    Gets together: Not on file    Attends religious service: Not on file    Active member of club or organization: Not on file    Attends meetings of clubs or organizations: Not on file    Relationship status: Not on file  . Intimate partner violence:    Fear of current or ex partner: Not on file    Emotionally abused: Not on file    Physically abused: Not on file    Forced sexual activity: Not on file  Other Topics Concern  . Not on file  Social History Narrative   Works at [employer].  Recently moved here from [place].   G0   The PMH, PSH, Social History, Family History, Medications, and allergies  have been reviewed in [facility], and have been updated if relevant.  Review of Systems  Constitutional: Negative.   HENT: Negative.   Respiratory: Negative.   Cardiovascular: Negative.   Gastrointestinal: Negative.   Endocrine: Negative.   Genitourinary: Negative.   Musculoskeletal: Negative.   Allergic/Immunologic: Negative.   Neurological: Negative.   Hematological: Negative.   Psychiatric/Behavioral: Negative.   All other systems reviewed and are negative.      Objective:    BP 136/82 (BP Location: Left Arm, Cuff Size: Normal)   Pulse 79   Temp 99.5 F (37.5 C) (Oral)   Ht 5\' 6"  (1.676 m)   Wt 213 lb 12.8 oz (97 kg)   SpO2 97%   BMI 34.51 kg/m    Physical Exam  Constitutional: She is oriented to person, place, and time. She appears well-developed and well-nourished. No distress.  HENT:  Head: Normocephalic and atraumatic.  Eyes: EOM are normal.  Neck: Normal range of motion.  Cardiovascular: Normal rate and regular rhythm.  Pulmonary/Chest: Effort normal and breath sounds normal.  Musculoskeletal: Normal range of motion. She exhibits no edema.  Neurological: She is alert and oriented to person, place, and time. No cranial nerve deficit.  Skin: Skin is warm and dry. She is not diaphoretic.  Psychiatric: She has a normal mood and affect. Her behavior is normal. Judgment and thought content normal.  Nursing note and vitals reviewed.         Assessment & Plan:   Allergic rhinitis due to pollen, unspecified seasonality  Essential hypertension No follow-ups on file.

## 2017-09-22 NOTE — Patient Instructions (Signed)
Great to see you! Happy birthday! 

## 2017-09-22 NOTE — Assessment & Plan Note (Signed)
Congratulated her on change in diet and weight loss!

## 2017-11-10 ENCOUNTER — Other Ambulatory Visit: Payer: Self-pay | Admitting: Family Medicine

## 2017-11-10 DIAGNOSIS — J320 Chronic maxillary sinusitis: Secondary | ICD-10-CM

## 2018-01-05 ENCOUNTER — Ambulatory Visit: Payer: Self-pay

## 2018-01-05 NOTE — Telephone Encounter (Signed)
LMOVM stating that ok to take the levocitirizine/thx dmf

## 2018-01-05 NOTE — Telephone Encounter (Signed)
Message from Berneta Levins sent at 01/05/2018 1:22 PM EST   Summary: allergy flare up   Pt states she was given levocetirizine (XYZAL) 5 MG tablet in the summer for an allergy. Pt states that her allergies are acting up again and she wants to know if it is safe to take that now. She does have the medication at her house because she didn't take all of it in the summer

## 2018-01-05 NOTE — Telephone Encounter (Signed)
Call placed to patient. Left VM to return call to office 

## 2018-03-16 ENCOUNTER — Ambulatory Visit (INDEPENDENT_AMBULATORY_CARE_PROVIDER_SITE_OTHER): Payer: Managed Care, Other (non HMO)

## 2018-03-16 ENCOUNTER — Encounter: Payer: Self-pay | Admitting: Family Medicine

## 2018-03-16 ENCOUNTER — Ambulatory Visit: Payer: Managed Care, Other (non HMO) | Admitting: Family Medicine

## 2018-03-16 VITALS — BP 138/74 | HR 82 | Temp 99.2°F | Ht 66.0 in | Wt 224.6 lb

## 2018-03-16 DIAGNOSIS — M79671 Pain in right foot: Secondary | ICD-10-CM

## 2018-03-16 NOTE — Progress Notes (Signed)
SUBJECTIVE: Ruth Elliott is a 48 y.o. female who sustained a right foot injury 3 week(s) ago. Mechanism of injury: new, uncomfortable shoes. Immediate symptoms: immediate pain. Symptoms have been insidious since that time. Prior history of related problems: no prior problems with this area in the past.   Current Outpatient Medications on File Prior to Visit  Medication Sig Dispense Refill  . B Complex Vitamins (B COMPLEX PO) Take by mouth.    . COLLAGEN PO Take by mouth. Collagen Peptide in coffee in am    . fexofenadine (ALLEGRA ALLERGY) 180 MG tablet Take 180 mg by mouth daily.    . Flax Oil-Fish Oil-Borage Oil (FISH OIL-FLAX OIL-BORAGE OIL) CAPS Take by mouth.    . fluticasone (FLONASE) 50 MCG/ACT nasal spray SHAKE LIQUID AND USE 2 SPRAYS IN EACH NOSTRIL DAILY 16 g 1  . levocetirizine (XYZAL) 5 MG tablet Take 1 tablet (5 mg total) by mouth every evening. 30 tablet 3  . Multiple Vitamin (MULTIVITAMIN) tablet Take 1 tablet by mouth daily.    . norethindrone-ethinyl estradiol (MICROGESTIN,JUNEL,LOESTRIN) 1-20 MG-MCG tablet Take 1 tablet by mouth daily.  0   No current facility-administered medications on file prior to visit.     Allergies  Allergen Reactions  . Penicillins Shortness Of Breath  . Lisinopril     paresthesia  . Singulair [Montelukast Sodium]     Moody    Past Medical History:  Diagnosis Date  . Allergy   . Endometriosis    Followed by Lynnell Chad- on Nortrel    Past Surgical History:  Procedure Laterality Date  . APPENDECTOMY    . NASAL SINUS SURGERY    . OVARIAN CYST SURGERY    . TONSILLECTOMY  1994    Family History  Problem Relation Age of Onset  . COPD Mother   . Hypertension Mother   . Alzheimer's disease Father   . Hypertension Father     Social History   Socioeconomic History  . Marital status: Single    Spouse name: Not on file  . Number of children: Not on file  . Years of education: Not on file  . Highest education level: Not on  file  Occupational History  . Not on file  Social Needs  . Financial resource strain: Not on file  . Food insecurity:    Worry: Not on file    Inability: Not on file  . Transportation needs:    Medical: Not on file    Non-medical: Not on file  Tobacco Use  . Smoking status: Never Smoker  . Smokeless tobacco: Never Used  Substance and Sexual Activity  . Alcohol use: Yes    Alcohol/week: 0.0 standard drinks  . Drug use: No  . Sexual activity: Not on file  Lifestyle  . Physical activity:    Days per week: Not on file    Minutes per session: Not on file  . Stress: Not on file  Relationships  . Social connections:    Talks on phone: Not on file    Gets together: Not on file    Attends religious service: Not on file    Active member of club or organization: Not on file    Attends meetings of clubs or organizations: Not on file    Relationship status: Not on file  . Intimate partner violence:    Fear of current or ex partner: Not on file    Emotionally abused: Not on file    Physically abused: Not  on file    Forced sexual activity: Not on file  Other Topics Concern  . Not on file  Social History Narrative   Works at [employer].  Recently moved here from [place].   G0   The PMH, PSH, Social History, Family History, Medications, and allergies have been reviewed in [facility], and have been updated if relevant.  OBJECTIVE: BP 138/74   Pulse 82   Temp 99.2 F (37.3 C) (Oral)   Ht 5\' 6"  (1.676 m)   Wt 224 lb 9.6 oz (101.9 kg)   SpO2 98%   BMI 36.25 kg/m   Vital signs as noted above. Appearance: alert, well appearing, and in no distress and oriented to person, place, and time. Foot/ankle exam: soft tissue swelling and tenderness over the base of 5th metatarsal, no tenderness of the medial malleolus, ankle joint is intact, no pain with weight bearing. X-ray: ordered, but results not yet available.  ASSESSMENT/PLAN: foot tendon injury and rule out bony abnormality with foot  xray. The patient indicates understanding of these issues and agrees with the plan.

## 2018-05-28 ENCOUNTER — Telehealth: Payer: Self-pay | Admitting: Family Medicine

## 2018-05-28 NOTE — Telephone Encounter (Signed)
Pt is scheduled for 5.20.20 at 11:20am for a F2F with TA/thx dmf

## 2018-05-28 NOTE — Telephone Encounter (Signed)
Patient called stating that she has a lump on the side of her neck. Patient think it could be her thyroids. I did offer her a virtual visit with Dr. Deborra Medina but the patient is wanting a F2F visit. Patient phone number is [phone]. Please advise.

## 2018-06-02 ENCOUNTER — Encounter: Payer: Self-pay | Admitting: Family Medicine

## 2018-06-03 ENCOUNTER — Encounter: Payer: Self-pay | Admitting: Family Medicine

## 2018-06-03 ENCOUNTER — Ambulatory Visit: Payer: Managed Care, Other (non HMO) | Admitting: Family Medicine

## 2018-06-03 VITALS — BP 139/86 | HR 79 | Temp 97.8°F | Ht 66.0 in | Wt 227.0 lb

## 2018-06-03 DIAGNOSIS — R221 Localized swelling, mass and lump, neck: Secondary | ICD-10-CM | POA: Diagnosis not present

## 2018-06-03 NOTE — Progress Notes (Signed)
 Subjective:   Patient ID: Ruth Elliott, female    DOB: 02/23/1970, 47 y.o.   MRN: [id number]  Ruth Elliott is a pleasant 47 y.o. year old female who presents to clinic today with Mass (Patient is here today C/O a lump in the left side of her neck.  She would like to R/O any thyroid issues.  No family H/O thyroid Dz.  She denies any pain.  She first noticed it end of March.)  on 06/03/2018  HPI:  Lump on side of her neck:  Received the following phone call from patient:  Patient called stating that she has a lump on the side of her neck. Patient think it could be her thyroids. I did offer her a virtual visit with Dr. Aron but the patient is wanting a F2F visit. Please advise.   No family history of thyroid disease that she is aware of.  First noticed it at the end of March and it is not painful.  She does not think it has grown in size.  Not difficult or painful to swallow. Current Outpatient Medications on File Prior to Visit  Medication Sig Dispense Refill  . B Complex Vitamins (B COMPLEX PO) Take by mouth.    . COLLAGEN PO Take by mouth. Collagen Peptide in coffee in am    . fexofenadine (ALLEGRA ALLERGY) 180 MG tablet Take 180 mg by mouth daily.    . Flax Oil-Fish Oil-Borage Oil (FISH OIL-FLAX OIL-BORAGE OIL) CAPS Take by mouth.    . fluticasone (FLONASE) 50 MCG/ACT nasal spray SHAKE LIQUID AND USE 2 SPRAYS IN EACH NOSTRIL DAILY 16 g 1  . levocetirizine (XYZAL) 5 MG tablet Take 1 tablet (5 mg total) by mouth every evening. 30 tablet 3  . Multiple Vitamin (MULTIVITAMIN) tablet Take 1 tablet by mouth daily.    . norethindrone-ethinyl estradiol (MICROGESTIN,JUNEL,LOESTRIN) 1-20 MG-MCG tablet Take 1 tablet by mouth daily.  0   No current facility-administered medications on file prior to visit.     Allergies  Allergen Reactions  . Penicillins Shortness Of Breath  . Lisinopril     paresthesia  . Singulair [Montelukast Sodium]     Moody    Past Medical History:   Diagnosis Date  . Allergy   . Endometriosis    Followed by [facility]- on Nortrel    Past Surgical History:  Procedure Laterality Date  . APPENDECTOMY    . NASAL SINUS SURGERY    . OVARIAN CYST SURGERY    . TONSILLECTOMY  1994    Family History  Problem Relation Age of Onset  . COPD Mother   . Hypertension Mother   . Alzheimer's disease Father   . Hypertension Father     Social History   Socioeconomic History  . Marital status: Single    Spouse name: Not on file  . Number of children: Not on file  . Years of education: Not on file  . Highest education level: Not on file  Occupational History  . Not on file  Social Needs  . Financial resource strain: Not on file  . Food insecurity:    Worry: Not on file    Inability: Not on file  . Transportation needs:    Medical: Not on file    Non-medical: Not on file  Tobacco Use  . Smoking status: Never Smoker  . Smokeless tobacco: Never Used  Substance and Sexual Activity  . Alcohol use: Yes    Alcohol/week: 0.0 standard drinks  .   Drug use: No  . Sexual activity: Not on file  Lifestyle  . Physical activity:    Days per week: Not on file    Minutes per session: Not on file  . Stress: Not on file  Relationships  . Social connections:    Talks on phone: Not on file    Gets together: Not on file    Attends religious service: Not on file    Active member of club or organization: Not on file    Attends meetings of clubs or organizations: Not on file    Relationship status: Not on file  . Intimate partner violence:    Fear of current or ex partner: Not on file    Emotionally abused: Not on file    Physically abused: Not on file    Forced sexual activity: Not on file  Other Topics Concern  . Not on file  Social History Narrative   Works at [employer].  Recently moved here from [place].   G0   The PMH, PSH, Social History, Family History, Medications, and allergies have been reviewed in [facility], and have been  updated if relevant.   Review of Systems  Constitutional: Negative.   HENT: Negative for sore throat, trouble swallowing and voice change.   Allergic/Immunologic: Negative.   Neurological: Negative.   Hematological: Negative.   All other systems reviewed and are negative.      Objective:    BP 139/86 (BP Location: Left Arm, Cuff Size: Normal)   Pulse 79   Temp 97.8 F (36.6 C) (Oral)   Ht 5' 6" (1.676 m)   Wt 227 lb (103 kg)   BMI 36.64 kg/m    Physical Exam Vitals signs and nursing note reviewed.  Constitutional:      General: She is not in acute distress.    Appearance: Normal appearance. She is not toxic-appearing.  HENT:     Head: Normocephalic and atraumatic.     Nose: Nose normal.  Neck:     Musculoskeletal: Full passive range of motion without pain and normal range of motion.   Cardiovascular:     Rate and Rhythm: Normal rate.  Pulmonary:     Effort: Pulmonary effort is normal.  Neurological:     General: No focal deficit present.     Mental Status: She is alert. Mental status is at baseline.  Psychiatric:        Mood and Affect: Mood normal.        Behavior: Behavior normal.        Thought Content: Thought content normal.        Judgment: Judgment normal.           Assessment & Plan:   Lump in neck - Plan: CBC w/Diff, Comp Met (CMET), T3, T4, free, TSH, US Soft Tissue Head/Neck, CANCELED: TSH, CANCELED: T4, free, CANCELED: T3, CANCELED: CBC w/Diff, CANCELED: Comp Met (CMET)  Mass in neck No follow-ups on file.  

## 2018-06-03 NOTE — Patient Instructions (Addendum)
Great to see you. I will call you with your lab results from today and you can view them online.   We are referring you for an ultrasound.  Someone will call you with that appointment.

## 2018-06-03 NOTE — Assessment & Plan Note (Signed)
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  Will check labs today, including thyroid panel and order [name] of neck. The patient indicates understanding of these issues and agrees with the plan. Orders Placed This Encounter  Procedures  . US Soft Tissue Head/Neck  . CBC w/Diff  . Comp Met (CMET)  . T3  . T4, free  . TSH

## 2018-06-04 LAB — CBC WITH DIFFERENTIAL/PLATELET
Basophils Absolute: 0.1 10*3/uL (ref 0.0–0.2)
Basos: 1 %
EOS (ABSOLUTE): 0.2 10*3/uL (ref 0.0–0.4)
Eos: 2 %
Hematocrit: 41.9 % (ref 34.0–46.6)
Hemoglobin: 14.6 g/dL (ref 11.1–15.9)
Immature Grans (Abs): 0.1 10*3/uL (ref 0.0–0.1)
Immature Granulocytes: 1 %
Lymphocytes Absolute: 3.6 10*3/uL — ABNORMAL HIGH (ref 0.7–3.1)
Lymphs: 36 %
MCH: 32.7 pg (ref 26.6–33.0)
MCHC: 34.8 g/dL (ref 31.5–35.7)
MCV: 94 fL (ref 79–97)
Monocytes Absolute: 0.6 10*3/uL (ref 0.1–0.9)
Monocytes: 6 %
Neutrophils Absolute: 5.5 10*3/uL (ref 1.4–7.0)
Neutrophils: 54 %
Platelets: 207 10*3/uL (ref 150–450)
RBC: 4.47 x10E6/uL (ref 3.77–5.28)
RDW: 12.2 % (ref 11.7–15.4)
WBC: 10 10*3/uL (ref 3.4–10.8)

## 2018-06-04 LAB — COMPREHENSIVE METABOLIC PANEL
ALT: 14 IU/L (ref 0–32)
AST: 21 IU/L (ref 0–40)
Albumin/Globulin Ratio: 1.6 (ref 1.2–2.2)
Albumin: 4 g/dL (ref 3.8–4.8)
Alkaline Phosphatase: 62 IU/L (ref 39–117)
BUN/Creatinine Ratio: 15 (ref 9–23)
BUN: 11 mg/dL (ref 6–24)
Bilirubin Total: 0.6 mg/dL (ref 0.0–1.2)
CO2: 24 mmol/L (ref 20–29)
Calcium: 9.2 mg/dL (ref 8.7–10.2)
Chloride: 104 mmol/L (ref 96–106)
Creatinine, Ser: 0.74 mg/dL (ref 0.57–1.00)
GFR calc Af Amer: 111 mL/min/{1.73_m2} (ref >59)
GFR calc non Af Amer: 96 mL/min/{1.73_m2} (ref >59)
Globulin, Total: 2.5 g/dL (ref 1.5–4.5)
Glucose: 85 mg/dL (ref 65–99)
Potassium: 4.2 mmol/L (ref 3.5–5.2)
Sodium: 141 mmol/L (ref 134–144)
Total Protein: 6.5 g/dL (ref 6.0–8.5)

## 2018-06-04 LAB — TSH: TSH: 1.43 u[IU]/mL (ref 0.450–4.500)

## 2018-06-04 LAB — T3: T3, Total: 192 ng/dL — ABNORMAL HIGH (ref 71–180)

## 2018-06-04 LAB — T4, FREE: Free T4: 1.31 ng/dL (ref 0.82–1.77)

## 2018-06-05 ENCOUNTER — Encounter: Payer: Self-pay | Admitting: Family Medicine

## 2018-06-05 ENCOUNTER — Other Ambulatory Visit: Payer: Self-pay | Admitting: Family Medicine

## 2018-06-05 DIAGNOSIS — R221 Localized swelling, mass and lump, neck: Secondary | ICD-10-CM

## 2018-06-05 DIAGNOSIS — R7989 Other specified abnormal findings of blood chemistry: Secondary | ICD-10-CM

## 2018-06-15 LAB — HM MAMMOGRAPHY

## 2018-06-18 ENCOUNTER — Other Ambulatory Visit: Payer: Self-pay | Admitting: Family Medicine

## 2018-06-18 ENCOUNTER — Other Ambulatory Visit: Payer: Self-pay

## 2018-06-18 ENCOUNTER — Ambulatory Visit
Admission: RE | Admit: 2018-06-18 | Discharge: 2018-06-18 | Disposition: A | Discharge status: Home / Self Care | Payer: Managed Care, Other (non HMO) | Source: Ambulatory Visit | Attending: Family Medicine | Admitting: Family Medicine

## 2018-06-18 DIAGNOSIS — R221 Localized swelling, mass and lump, neck: Secondary | ICD-10-CM | POA: Diagnosis present

## 2018-06-20 ENCOUNTER — Encounter: Payer: Self-pay | Admitting: Family Medicine

## 2018-06-22 ENCOUNTER — Encounter: Payer: Self-pay | Admitting: Family Medicine

## 2018-06-22 ENCOUNTER — Other Ambulatory Visit: Payer: Self-pay | Admitting: Family Medicine

## 2018-06-22 DIAGNOSIS — E041 Nontoxic single thyroid nodule: Secondary | ICD-10-CM

## 2018-06-22 NOTE — Progress Notes (Signed)
E 

## 2018-07-13 ENCOUNTER — Other Ambulatory Visit: Payer: Self-pay | Admitting: Unknown Physician Specialty

## 2018-07-13 DIAGNOSIS — E041 Nontoxic single thyroid nodule: Secondary | ICD-10-CM

## 2018-08-18 ENCOUNTER — Encounter: Payer: Self-pay | Admitting: Endocrinology

## 2018-08-26 ENCOUNTER — Encounter: Payer: Self-pay | Admitting: Family Medicine

## 2018-08-27 ENCOUNTER — Other Ambulatory Visit: Payer: Self-pay | Admitting: Family Medicine

## 2018-08-27 DIAGNOSIS — E041 Nontoxic single thyroid nodule: Secondary | ICD-10-CM

## 2018-09-25 ENCOUNTER — Other Ambulatory Visit: Payer: Self-pay

## 2018-09-27 DIAGNOSIS — Z1159 Encounter for screening for other viral diseases: Secondary | ICD-10-CM | POA: Insufficient documentation

## 2018-09-27 DIAGNOSIS — Z0000 Encounter for general adult medical examination without abnormal findings: Secondary | ICD-10-CM | POA: Insufficient documentation

## 2018-09-27 DIAGNOSIS — E049 Nontoxic goiter, unspecified: Secondary | ICD-10-CM | POA: Insufficient documentation

## 2018-09-27 NOTE — Progress Notes (Signed)
Subjective:   Patient ID: Ruth Elliott, female    DOB: 11-12-1970, 48 y.o.   MRN: [id number]  Ruth Elliott is a pleasant 48 y.o. year old female who presents to clinic today with Annual Exam  on 09/28/2018  HPI:  Health Maintenance  Topic Date Due  . INFLUENZA VACCINE  08/15/2018  . TETANUS/TDAP  05/26/2019  . PAP SMEAR-Modifier  03/23/2020  . HIV Screening  Completed   Depression screen [facility] 2/9 09/28/2018 09/28/2018 08/18/2017 08/12/2016 07/13/2015  Decreased Interest 0 0 0 0 0  Down, Depressed, Hopeless 0 0 0 0 0  PHQ - 2 Score 0 0 0 0 0  Difficult doing work/chores - Not difficult at all - - -   Has lost 14 pounds since 05/2018- exercising. Also doing intermittent fasting.  Has GYN- [facility]. mammogram done in June, next pap smear due in 03/2020.  Thyroid nodule-  followed by Dr. Tami Ribas.  She was seen by him on 07/14/18- note reviewed.  He advised 6 month follow up. Lab Results  Component Value Date   TSH 1.430 06/03/2018   He has her scheduled for another ultrasound on 01/11/19.  Has also been to endocrinology, fine needle aspiration scheduled with Dr. Pasty Arch tomorrow.  Current Outpatient Medications on File Prior to Visit  Medication Sig Dispense Refill  . B Complex Vitamins (B COMPLEX PO) Take by mouth.    . COLLAGEN PO Take by mouth. Collagen Peptide in coffee in am    . fexofenadine (ALLEGRA ALLERGY) 180 MG tablet Take 180 mg by mouth daily.    . Flax Oil-Fish Oil-Borage Oil (FISH OIL-FLAX OIL-BORAGE OIL) CAPS Take by mouth.    . fluticasone (FLONASE) 50 MCG/ACT nasal spray SHAKE LIQUID AND USE 2 SPRAYS IN EACH NOSTRIL DAILY 16 g 1  . levocetirizine (XYZAL) 5 MG tablet Take 1 tablet (5 mg total) by mouth every evening. 30 tablet 3  . Multiple Vitamin (MULTIVITAMIN) tablet Take 1 tablet by mouth daily.    . norethindrone-ethinyl estradiol (MICROGESTIN,JUNEL,LOESTRIN) 1-20 MG-MCG tablet Take 1 tablet by mouth daily.  0   No current facility-administered  medications on file prior to visit.     Allergies  Allergen Reactions  . Penicillins Shortness Of Breath  . Lisinopril     paresthesia  . Singulair [Montelukast Sodium]     Moody    Past Medical History:  Diagnosis Date  . Allergy   . Endometriosis    Followed by Lynnell Chad- on Nortrel    Past Surgical History:  Procedure Laterality Date  . APPENDECTOMY    . NASAL SINUS SURGERY    . OVARIAN CYST SURGERY    . TONSILLECTOMY  1994    Family History  Problem Relation Age of Onset  . COPD Mother   . Hypertension Mother   . Alzheimer's disease Father   . Hypertension Father     Social History   Socioeconomic History  . Marital status: Single    Spouse name: Not on file  . Number of children: Not on file  . Years of education: Not on file  . Highest education level: Not on file  Occupational History  . Not on file  Social Needs  . Financial resource strain: Not on file  . Food insecurity    Worry: Not on file    Inability: Not on file  . Transportation needs    Medical: Not on file    Non-medical: Not on file  Tobacco Use  .  Smoking status: Never Smoker  . Smokeless tobacco: Never Used  Substance and Sexual Activity  . Alcohol use: Yes    Alcohol/week: 0.0 standard drinks  . Drug use: No  . Sexual activity: Not on file  Lifestyle  . Physical activity    Days per week: Not on file    Minutes per session: Not on file  . Stress: Not on file  Relationships  . Social Herbalist on phone: Not on file    Gets together: Not on file    Attends religious service: Not on file    Active member of club or organization: Not on file    Attends meetings of clubs or organizations: Not on file    Relationship status: Not on file  . Intimate partner violence    Fear of current or ex partner: Not on file    Emotionally abused: Not on file    Physically abused: Not on file    Forced sexual activity: Not on file  Other Topics Concern  . Not on file   Social History Narrative   Works at [employer].  Recently moved here from [place].   G0   The PMH, PSH, Social History, Family History, Medications, and allergies have been reviewed in [facility], and have been updated if relevant.   Review of Systems  Constitutional: Negative.   HENT: Negative for sinus pressure.   Eyes: Negative.   Respiratory: Negative for shortness of breath and wheezing.   Cardiovascular: Negative.   Gastrointestinal: Negative.   Endocrine: Negative.   Genitourinary: Negative.   Musculoskeletal: Negative.   Allergic/Immunologic: Positive for environmental allergies.  Neurological: Negative.   Hematological: Negative.   Psychiatric/Behavioral: Negative.   All other systems reviewed and are negative.      Objective:    BP 124/76 (BP Location: Left Arm, Patient Position: Sitting, Cuff Size: Normal)   Pulse 68   Temp 98.4 F (36.9 C) (Oral)   Ht 5\' 6"  (1.676 m)   Wt 218 lb 3.2 oz (99 kg)   SpO2 97%   BMI 35.22 kg/m   Wt Readings from Last 3 Encounters:  09/28/18 218 lb 3.2 oz (99 kg)  06/03/18 227 lb (103 kg)  03/16/18 224 lb 9.6 oz (101.9 kg)     Physical Exam Vitals signs and nursing note reviewed.  Constitutional:      General: She is not in acute distress.    Appearance: She is well-developed. She is not diaphoretic.  HENT:     Head: Normocephalic.     Right Ear: Hearing and tympanic membrane normal.     Left Ear: Hearing and tympanic membrane normal.     Nose: Mucosal edema present.     Right Sinus: No maxillary sinus tenderness or frontal sinus tenderness.     Left Sinus: No maxillary sinus tenderness or frontal sinus tenderness.  Eyes:     Pupils: Pupils are equal, round, and reactive to light.  Neck:     Musculoskeletal: Normal range of motion and neck supple.     Thyroid: Thyroid mass and thyromegaly present.  Cardiovascular:     Rate and Rhythm: Normal rate and regular rhythm.  Pulmonary:     Effort: Pulmonary effort is normal.      Breath sounds: Normal breath sounds.  Abdominal:     General: Bowel sounds are normal.     Palpations: Abdomen is soft.  Musculoskeletal: Normal range of motion.  Skin:    General: Skin is  warm and dry.  Neurological:     Mental Status: She is alert and oriented to person, place, and time.     Cranial Nerves: No cranial nerve deficit.  Psychiatric:        Behavior: Behavior normal.        Thought Content: Thought content normal.        Judgment: Judgment normal.           Assessment & Plan:   Well woman exam without gynecological exam - Plan: Nicotine/cotinine metabolites( [facility])  Nodular goiter - Plan: TSH, T4, free, T3  Essential hypertension - Plan: Hemoglobin A1c  Allergic rhinitis due to pollen, unspecified seasonality  Hyperlipidemia, unspecified hyperlipidemia type - Plan: CBC with Differential/Platelet, Comprehensive metabolic panel, Lipid panel  Vitamin D deficiency - Plan: Vitamin D (25 hydroxy)  Vitamin B12 deficiency - Plan: B12  Exposure to Covid-19 Virus - Plan: SARS-CoV-2 Antibody, IgM No follow-ups on file.

## 2018-09-28 ENCOUNTER — Encounter: Payer: Self-pay | Admitting: Family Medicine

## 2018-09-28 ENCOUNTER — Other Ambulatory Visit: Payer: Self-pay

## 2018-09-28 ENCOUNTER — Ambulatory Visit (INDEPENDENT_AMBULATORY_CARE_PROVIDER_SITE_OTHER): Payer: Managed Care, Other (non HMO) | Admitting: Family Medicine

## 2018-09-28 VITALS — BP 124/76 | HR 68 | Temp 98.4°F | Ht 66.0 in | Wt 218.2 lb

## 2018-09-28 DIAGNOSIS — Z0000 Encounter for general adult medical examination without abnormal findings: Secondary | ICD-10-CM | POA: Diagnosis not present

## 2018-09-28 DIAGNOSIS — E785 Hyperlipidemia, unspecified: Secondary | ICD-10-CM

## 2018-09-28 DIAGNOSIS — J301 Allergic rhinitis due to pollen: Secondary | ICD-10-CM

## 2018-09-28 DIAGNOSIS — Z20822 Contact with and (suspected) exposure to covid-19: Secondary | ICD-10-CM

## 2018-09-28 DIAGNOSIS — E049 Nontoxic goiter, unspecified: Secondary | ICD-10-CM | POA: Diagnosis not present

## 2018-09-28 DIAGNOSIS — E538 Deficiency of other specified B group vitamins: Secondary | ICD-10-CM

## 2018-09-28 DIAGNOSIS — I10 Essential (primary) hypertension: Secondary | ICD-10-CM | POA: Diagnosis not present

## 2018-09-28 DIAGNOSIS — E559 Vitamin D deficiency, unspecified: Secondary | ICD-10-CM

## 2018-09-28 DIAGNOSIS — Z20828 Contact with and (suspected) exposure to other viral communicable diseases: Secondary | ICD-10-CM

## 2018-09-28 NOTE — Patient Instructions (Signed)
Great to see you. I will call you with your lab results from today and you can view them online.   Happy birthday!

## 2018-09-28 NOTE — Assessment & Plan Note (Signed)
Reviewed preventive care protocols, scheduled due services, and updated immunizations Discussed nutrition, exercise, diet, and healthy lifestyle.  

## 2018-09-28 NOTE — Assessment & Plan Note (Signed)
BP is excellent.

## 2018-09-28 NOTE — Assessment & Plan Note (Signed)
Getting fine needle aspiration tomorrow.

## 2018-09-30 DIAGNOSIS — E041 Nontoxic single thyroid nodule: Secondary | ICD-10-CM | POA: Insufficient documentation

## 2018-10-02 LAB — CBC WITH DIFFERENTIAL/PLATELET
Basophils Absolute: 0.1 10*3/uL (ref 0.0–0.2)
Basos: 1 %
EOS (ABSOLUTE): 0.1 10*3/uL (ref 0.0–0.4)
Eos: 1 %
Hematocrit: 42.5 % (ref 34.0–46.6)
Hemoglobin: 14.7 g/dL (ref 11.1–15.9)
Immature Grans (Abs): 0.1 10*3/uL (ref 0.0–0.1)
Immature Granulocytes: 1 %
Lymphocytes Absolute: 3.1 10*3/uL (ref 0.7–3.1)
Lymphs: 28 %
MCH: 32.7 pg (ref 26.6–33.0)
MCHC: 34.6 g/dL (ref 31.5–35.7)
MCV: 94 fL (ref 79–97)
Monocytes Absolute: 0.7 10*3/uL (ref 0.1–0.9)
Monocytes: 6 %
Neutrophils Absolute: 7.1 10*3/uL — ABNORMAL HIGH (ref 1.4–7.0)
Neutrophils: 63 %
Platelets: 196 10*3/uL (ref 150–450)
RBC: 4.5 x10E6/uL (ref 3.77–5.28)
RDW: 11.8 % (ref 11.7–15.4)
WBC: 11.1 10*3/uL — ABNORMAL HIGH (ref 3.4–10.8)

## 2018-10-02 LAB — VITAMIN D 25 HYDROXY (VIT D DEFICIENCY, FRACTURES): Vit D, 25-Hydroxy: 35.3 ng/mL (ref 30.0–100.0)

## 2018-10-02 LAB — COMPREHENSIVE METABOLIC PANEL
ALT: 10 IU/L (ref 0–32)
AST: 18 IU/L (ref 0–40)
Albumin/Globulin Ratio: 1.8 (ref 1.2–2.2)
Albumin: 4.6 g/dL (ref 3.8–4.8)
Alkaline Phosphatase: 61 IU/L (ref 39–117)
BUN/Creatinine Ratio: 19 (ref 9–23)
BUN: 12 mg/dL (ref 6–24)
Bilirubin Total: 0.5 mg/dL (ref 0.0–1.2)
CO2: 22 mmol/L (ref 20–29)
Calcium: 9.2 mg/dL (ref 8.7–10.2)
Chloride: 102 mmol/L (ref 96–106)
Creatinine, Ser: 0.63 mg/dL (ref 0.57–1.00)
GFR calc Af Amer: 123 mL/min/{1.73_m2} (ref >59)
GFR calc non Af Amer: 106 mL/min/{1.73_m2} (ref >59)
Globulin, Total: 2.6 g/dL (ref 1.5–4.5)
Glucose: 87 mg/dL (ref 65–99)
Potassium: 3.9 mmol/L (ref 3.5–5.2)
Sodium: 140 mmol/L (ref 134–144)
Total Protein: 7.2 g/dL (ref 6.0–8.5)

## 2018-10-02 LAB — VITAMIN B12: Vitamin B-12: 441 pg/mL (ref 232–1245)

## 2018-10-02 LAB — HEMOGLOBIN A1C
Est. average glucose Bld gHb Est-mCnc: 105 mg/dL
Hgb A1c MFr Bld: 5.3 % (ref 4.8–5.6)

## 2018-10-02 LAB — LIPID PANEL
Chol/HDL Ratio: 3 ratio (ref 0.0–4.4)
Cholesterol, Total: 113 mg/dL (ref 100–199)
HDL: 38 mg/dL — ABNORMAL LOW (ref >39)
LDL Chol Calc (NIH): 51 mg/dL (ref 0–99)
Triglycerides: 138 mg/dL (ref 0–149)
VLDL Cholesterol Cal: 24 mg/dL (ref 5–40)

## 2018-10-02 LAB — NICOTINE/COTININE METABOLITES
Cotinine: <1 ng/mL
Nicotine: <1 ng/mL

## 2018-10-02 LAB — T3: T3, Total: 173 ng/dL (ref 71–180)

## 2018-10-02 LAB — TSH: TSH: 1.42 u[IU]/mL (ref 0.450–4.500)

## 2018-10-02 LAB — T4, FREE: Free T4: 1.43 ng/dL (ref 0.82–1.77)

## 2018-10-02 LAB — SARS-COV-2 ANTIBODY, IGM: SARS-CoV-2 Antibody, IgM: NEGATIVE

## 2018-10-19 ENCOUNTER — Other Ambulatory Visit: Payer: Self-pay | Admitting: Unknown Physician Specialty

## 2018-10-19 DIAGNOSIS — E041 Nontoxic single thyroid nodule: Secondary | ICD-10-CM

## 2018-10-23 ENCOUNTER — Ambulatory Visit
Admission: RE | Admit: 2018-10-23 | Discharge: 2018-10-23 | Disposition: A | Discharge status: Home / Self Care | Payer: Managed Care, Other (non HMO) | Source: Ambulatory Visit | Attending: Unknown Physician Specialty | Admitting: Unknown Physician Specialty

## 2018-10-23 ENCOUNTER — Other Ambulatory Visit: Payer: Self-pay

## 2018-10-23 DIAGNOSIS — E041 Nontoxic single thyroid nodule: Secondary | ICD-10-CM | POA: Diagnosis present

## 2018-10-23 MED ORDER — IOHEXOL 300 MG/ML  SOLN
75.0000 mL | Freq: Once | INTRAMUSCULAR | Status: AC | PRN
  Administered 2018-10-23: 10:00:00 75 mL via INTRAVENOUS

## 2018-11-04 ENCOUNTER — Other Ambulatory Visit: Payer: Managed Care, Other (non HMO)

## 2018-11-11 ENCOUNTER — Other Ambulatory Visit: Payer: Managed Care, Other (non HMO)

## 2018-11-12 ENCOUNTER — Other Ambulatory Visit: Payer: Managed Care, Other (non HMO)

## 2018-11-12 ENCOUNTER — Other Ambulatory Visit: Payer: Self-pay

## 2018-11-12 ENCOUNTER — Other Ambulatory Visit
Admission: RE | Admit: 2018-11-12 | Discharge: 2018-11-12 | Disposition: A | Discharge status: Home / Self Care | Payer: Managed Care, Other (non HMO) | Source: Ambulatory Visit | Attending: Unknown Physician Specialty | Admitting: Unknown Physician Specialty

## 2018-11-12 DIAGNOSIS — Z01812 Encounter for preprocedural laboratory examination: Secondary | ICD-10-CM | POA: Insufficient documentation

## 2018-11-12 DIAGNOSIS — Z20828 Contact with and (suspected) exposure to other viral communicable diseases: Secondary | ICD-10-CM | POA: Insufficient documentation

## 2018-11-12 NOTE — Patient Instructions (Signed)
Your COVID swab is scheduled on: Friday 11/12/2018.  Drive up in front of [place] any time 8:00-10:30am and remain in your vehicle.  Your procedure is scheduled on: Tuesday 11/17/2018 Report to Same Day Surgery 2nd floor [facility] Entrance-take elevator on left to 2nd floor.  Check in with surgery information desk.) To find out your arrival time, call [phone] 1:00-3:00 PM on Monday 11/16/2018  Remember: Instructions that are not followed completely may result in serious medical risk, up to and including death, or upon the discretion of your surgeon and anesthesiologist your surgery may need to be rescheduled.    __x__ 1. Do not eat food (including mints, candies, chewing gum) after midnight the night before your procedure. You may drink clear liquids up to 2 hours before you are scheduled to arrive at the hospital for your procedure.  Do not drink anything within 2 hours of your scheduled arrival to the hospital.  Approved clear liquids:  --Water or Apple juice without pulp  --Clear carbohydrate beverage such as Gatorade or Powerade  --Black Coffee or Clear Tea (No milk, no creamers, do not add anything to the coffee or tea)    __x__ 2. No Alcohol for 24 hours before or after surgery.   __x__ 3. No Smoking or e-cigarettes for 24 hours before surgery.  Do not use any chewable tobacco products for at least 6 hours before surgery.   __x__ 4. Notify your doctor if there is any change in your medical condition (cold, fever, infections).   __x__ 5. On the morning of surgery brush your teeth with toothpaste and water.  You may rinse your mouth with mouthwash if you wish.  Do not swallow any toothpaste or mouthwash.  Please read over the following fact sheets that you were given:   [facility] Preparing for Surgery and/or MRSA Information    __x__ Use CHG Soap as directed on instruction sheet.   Do not wear jewelry, make-up, hairpins, clips or nail polish on the  day of surgery.  Do not wear lotions, powders, deodorant, or perfumes.   Do not shave below the face/neck 48 hours prior to surgery.   Do not bring valuables to the hospital.    [facility] is not responsible for any belongings or valuables.               Contacts and eyeglasses may not be worn into surgery.  For patients discharged on the day of surgery, you will NOT be permitted to drive yourself home.  You must have a responsible adult with you for 24 hours after surgery.  __x__ Take these medicines on the morning of surgery with a SMALL SIP OF WATER:  1. NONE  __x__ Follow recommendations from Cardiologist, Pulmonologist or PCP regarding stopping Aspirin, Coumadin, Plavix, Eliquis, Effient, Pradaxa, and Pletal.  __x__ STARTING TODAY: Do not take any Anti-inflammatories such as Advil, Ibuprofen, Motrin, Aleve, Naproxen, Naprosyn, BC/Goodies powders or aspirin products. You may take Tylenol if needed.   __x__ STARTING TODAY: Do not take any over the counter supplements until after surgery.  Reviewed instructions with patient and answered questions via telephone interview 11/12/2018 @ 3:27 pm.

## 2018-11-13 ENCOUNTER — Other Ambulatory Visit
Admission: RE | Admit: 2018-11-13 | Discharge: 2018-11-13 | Disposition: A | Discharge status: Home / Self Care | Payer: Managed Care, Other (non HMO) | Source: Ambulatory Visit | Attending: Unknown Physician Specialty | Admitting: Unknown Physician Specialty

## 2018-11-13 DIAGNOSIS — Z01812 Encounter for preprocedural laboratory examination: Secondary | ICD-10-CM | POA: Diagnosis present

## 2018-11-13 DIAGNOSIS — Z20828 Contact with and (suspected) exposure to other viral communicable diseases: Secondary | ICD-10-CM | POA: Diagnosis not present

## 2018-11-13 LAB — SARS CORONAVIRUS 2 (TAT 6-24 HRS): SARS Coronavirus 2: NEGATIVE

## 2018-11-17 ENCOUNTER — Ambulatory Visit: Payer: Managed Care, Other (non HMO)

## 2018-11-17 ENCOUNTER — Telehealth: Payer: Self-pay | Admitting: Cardiovascular Disease

## 2018-11-17 ENCOUNTER — Other Ambulatory Visit: Payer: Self-pay

## 2018-11-17 ENCOUNTER — Ambulatory Visit
Admission: RE | Admit: 2018-11-17 | Discharge: 2018-11-17 | Disposition: A | Discharge status: Home / Self Care | Payer: Managed Care, Other (non HMO) | Attending: Unknown Physician Specialty | Admitting: Unknown Physician Specialty

## 2018-11-17 ENCOUNTER — Other Ambulatory Visit: Payer: Self-pay | Admitting: Cardiovascular Disease

## 2018-11-17 ENCOUNTER — Encounter
Admission: RE | Disposition: A | Discharge status: Home / Self Care | Payer: Self-pay | Source: Home / Self Care | Attending: Unknown Physician Specialty

## 2018-11-17 DIAGNOSIS — D34 Benign neoplasm of thyroid gland: Secondary | ICD-10-CM | POA: Insufficient documentation

## 2018-11-17 DIAGNOSIS — E669 Obesity, unspecified: Secondary | ICD-10-CM | POA: Insufficient documentation

## 2018-11-17 DIAGNOSIS — Z888 Allergy status to other drugs, medicaments and biological substances status: Secondary | ICD-10-CM | POA: Insufficient documentation

## 2018-11-17 DIAGNOSIS — I10 Essential (primary) hypertension: Secondary | ICD-10-CM | POA: Diagnosis not present

## 2018-11-17 DIAGNOSIS — Z79899 Other long term (current) drug therapy: Secondary | ICD-10-CM | POA: Diagnosis not present

## 2018-11-17 DIAGNOSIS — E041 Nontoxic single thyroid nodule: Secondary | ICD-10-CM | POA: Diagnosis present

## 2018-11-17 DIAGNOSIS — Z79890 Hormone replacement therapy (postmenopausal): Secondary | ICD-10-CM | POA: Insufficient documentation

## 2018-11-17 DIAGNOSIS — Z880 Allergy status to penicillin: Secondary | ICD-10-CM | POA: Insufficient documentation

## 2018-11-17 DIAGNOSIS — Z6834 Body mass index (BMI) 34.0-34.9, adult: Secondary | ICD-10-CM | POA: Diagnosis not present

## 2018-11-17 HISTORY — PX: THYROIDECTOMY: SHX17

## 2018-11-17 LAB — POCT PREGNANCY, URINE: Preg Test, Ur: NEGATIVE

## 2018-11-17 SURGERY — THYROIDECTOMY
Anesthesia: General | Laterality: Left

## 2018-11-17 MED ORDER — FAMOTIDINE 20 MG PO TABS
ORAL_TABLET | ORAL | Status: AC
  Administered 2018-11-17: 07:00:00 20 mg via ORAL
  Filled 2018-11-17: qty 1

## 2018-11-17 MED ORDER — FENTANYL CITRATE (PF) 100 MCG/2ML IJ SOLN
INTRAMUSCULAR | Status: AC
  Filled 2018-11-17: qty 2

## 2018-11-17 MED ORDER — EPHEDRINE SULFATE 50 MG/ML IJ SOLN
INTRAMUSCULAR | Status: DC | PRN
  Administered 2018-11-17: 5 mg via INTRAVENOUS

## 2018-11-17 MED ORDER — OXYCODONE HCL 5 MG PO TABS: 5.0000 mg | ORAL_TABLET | Freq: Once | ORAL | Status: DC | PRN

## 2018-11-17 MED ORDER — LIDOCAINE HCL (PF) 2 % IJ SOLN
INTRAMUSCULAR | Status: AC
  Filled 2018-11-17: qty 10

## 2018-11-17 MED ORDER — LIDOCAINE HCL (CARDIAC) PF 100 MG/5ML IV SOSY
PREFILLED_SYRINGE | INTRAVENOUS | Status: DC | PRN
  Administered 2018-11-17: 100 mg via INTRAVENOUS

## 2018-11-17 MED ORDER — ONDANSETRON HCL 4 MG/2ML IJ SOLN
INTRAMUSCULAR | Status: AC
  Filled 2018-11-17: qty 2

## 2018-11-17 MED ORDER — PROPOFOL 10 MG/ML IV BOLUS
INTRAVENOUS | Status: AC
  Filled 2018-11-17: qty 20

## 2018-11-17 MED ORDER — DEXMEDETOMIDINE HCL 200 MCG/2ML IV SOLN
INTRAVENOUS | Status: DC | PRN
  Administered 2018-11-17: 20 ug via INTRAVENOUS

## 2018-11-17 MED ORDER — SUCCINYLCHOLINE CHLORIDE 20 MG/ML IJ SOLN
INTRAMUSCULAR | Status: DC | PRN
  Administered 2018-11-17: 100 mg via INTRAVENOUS

## 2018-11-17 MED ORDER — SODIUM CHLORIDE 0.9 % IV SOLN
INTRAVENOUS | Status: DC | PRN
  Administered 2018-11-17: 40 ug/min via INTRAVENOUS

## 2018-11-17 MED ORDER — DEXMEDETOMIDINE HCL IN NACL 80 MCG/20ML IV SOLN
INTRAVENOUS | Status: AC
  Filled 2018-11-17: qty 20

## 2018-11-17 MED ORDER — ONDANSETRON HCL 4 MG/2ML IJ SOLN
INTRAMUSCULAR | Status: DC | PRN
  Administered 2018-11-17: 4 mg via INTRAVENOUS

## 2018-11-17 MED ORDER — HYDRALAZINE HCL 20 MG/ML IJ SOLN
5.0000 mg | Freq: Once | INTRAMUSCULAR | Status: AC
  Administered 2018-11-17: 10:00:00 5 mg via INTRAVENOUS

## 2018-11-17 MED ORDER — PROPOFOL 10 MG/ML IV BOLUS
INTRAVENOUS | Status: DC | PRN
  Administered 2018-11-17: 150 mg via INTRAVENOUS

## 2018-11-17 MED ORDER — MIDAZOLAM HCL 2 MG/2ML IJ SOLN
INTRAMUSCULAR | Status: AC
  Filled 2018-11-17: qty 2

## 2018-11-17 MED ORDER — FENTANYL CITRATE (PF) 100 MCG/2ML IJ SOLN
INTRAMUSCULAR | Status: DC | PRN
  Administered 2018-11-17 (×2): 50 ug via INTRAVENOUS
  Administered 2018-11-17 (×2): 25 ug via INTRAVENOUS

## 2018-11-17 MED ORDER — ROCURONIUM BROMIDE 50 MG/5ML IV SOLN
INTRAVENOUS | Status: AC
  Filled 2018-11-17: qty 1

## 2018-11-17 MED ORDER — HYDRALAZINE HCL 20 MG/ML IJ SOLN
10.0000 mg | Freq: Once | INTRAMUSCULAR | Status: AC
  Administered 2018-11-17: 11:00:00 10 mg via INTRAVENOUS
  Filled 2018-11-17: qty 1

## 2018-11-17 MED ORDER — ROCURONIUM BROMIDE 100 MG/10ML IV SOLN: INTRAVENOUS | Status: DC | PRN

## 2018-11-17 MED ORDER — HYDRALAZINE HCL 50 MG PO TABS: 50.0000 mg | ORAL_TABLET | Freq: Three times a day (TID) | ORAL | 1 refills | Status: DC | PRN

## 2018-11-17 MED ORDER — DEXAMETHASONE SODIUM PHOSPHATE 10 MG/ML IJ SOLN
INTRAMUSCULAR | Status: AC
  Filled 2018-11-17: qty 1

## 2018-11-17 MED ORDER — LIDOCAINE-EPINEPHRINE 1 %-1:100000 IJ SOLN
INTRAMUSCULAR | Status: AC
  Filled 2018-11-17: qty 1

## 2018-11-17 MED ORDER — FENTANYL CITRATE (PF) 100 MCG/2ML IJ SOLN: 25.0000 ug | INTRAMUSCULAR | Status: DC | PRN

## 2018-11-17 MED ORDER — SUCCINYLCHOLINE CHLORIDE 20 MG/ML IJ SOLN
INTRAMUSCULAR | Status: AC
  Filled 2018-11-17: qty 1

## 2018-11-17 MED ORDER — LIDOCAINE-EPINEPHRINE 1 %-1:100000 IJ SOLN
INTRAMUSCULAR | Status: DC | PRN
  Administered 2018-11-17: 5 mL

## 2018-11-17 MED ORDER — PHENYLEPHRINE HCL (PRESSORS) 10 MG/ML IV SOLN
INTRAVENOUS | Status: DC | PRN
  Administered 2018-11-17 (×2): 100 ug via INTRAVENOUS

## 2018-11-17 MED ORDER — HYDROCODONE-ACETAMINOPHEN 5-300 MG PO TABS: 1.0000 | ORAL_TABLET | ORAL | 0 refills | Status: DC | PRN

## 2018-11-17 MED ORDER — OXYCODONE HCL 5 MG/5ML PO SOLN: 5.0000 mg | Freq: Once | ORAL | Status: DC | PRN

## 2018-11-17 MED ORDER — DEXAMETHASONE SODIUM PHOSPHATE 10 MG/ML IJ SOLN
INTRAMUSCULAR | Status: DC | PRN
  Administered 2018-11-17: 10 mg via INTRAVENOUS

## 2018-11-17 MED ORDER — LACTATED RINGERS IV SOLN
INTRAVENOUS | Status: DC
  Administered 2018-11-17: 07:00:00 via INTRAVENOUS

## 2018-11-17 MED ORDER — PHENYLEPHRINE HCL (PRESSORS) 10 MG/ML IV SOLN
INTRAVENOUS | Status: AC
  Filled 2018-11-17: qty 1

## 2018-11-17 MED ORDER — MIDAZOLAM HCL 2 MG/2ML IJ SOLN
INTRAMUSCULAR | Status: DC | PRN
  Administered 2018-11-17: 2 mg via INTRAVENOUS

## 2018-11-17 MED ORDER — LIDOCAINE HCL 4 % MT SOLN
OROMUCOSAL | Status: DC | PRN
  Administered 2018-11-17: 4 mL via TOPICAL

## 2018-11-17 MED ORDER — HYDRALAZINE HCL 20 MG/ML IJ SOLN
INTRAMUSCULAR | Status: AC
  Administered 2018-11-17: 10:00:00 5 mg via INTRAVENOUS
  Filled 2018-11-17: qty 1

## 2018-11-17 MED ORDER — FAMOTIDINE 20 MG PO TABS
20.0000 mg | ORAL_TABLET | Freq: Once | ORAL | Status: AC
  Administered 2018-11-17: 07:00:00 20 mg via ORAL

## 2018-11-17 SURGICAL SUPPLY — 42 items
BLADE SURG 15 STRL LF DISP TIS (BLADE) ×1 IMPLANT
BLADE SURG 15 STRL SS (BLADE) ×2
BULB RESERV EVAC DRAIN JP 100C (MISCELLANEOUS) ×2 IMPLANT
CANISTER SUCT 1200ML W/VALVE (MISCELLANEOUS) ×3 IMPLANT
CLEANER CAUTERY TIP 5X5 PAD (MISCELLANEOUS) IMPLANT
CORD BIP STRL DISP 12FT (MISCELLANEOUS) ×3 IMPLANT
COVER WAND RF STERILE (DRAPES) ×3 IMPLANT
DERMABOND ADVANCED (GAUZE/BANDAGES/DRESSINGS) ×2
DERMABOND ADVANCED .7 DNX12 (GAUZE/BANDAGES/DRESSINGS) ×1 IMPLANT
DRAIN JP 10F RND SILICONE (MISCELLANEOUS) ×2 IMPLANT
DRAIN TLS ROUND 10FR (DRAIN) ×1 IMPLANT
DRAPE MAG INST 16X20 L/F (DRAPES) ×3 IMPLANT
DRSG TEGADERM 2-3/8X2-3/4 SM (GAUZE/BANDAGES/DRESSINGS) ×3 IMPLANT
DRSG TEGADERM 2X2.25 PEDS (GAUZE/BANDAGES/DRESSINGS) ×2 IMPLANT
ELECT LARYNGEAL 6/7 (MISCELLANEOUS)
ELECT LARYNGEAL 8/9 (MISCELLANEOUS) ×3
ELECT REM PT RETURN 9FT ADLT (ELECTROSURGICAL) ×3
ELECTRODE LARYNGEAL 6/7 (MISCELLANEOUS) ×1 IMPLANT
ELECTRODE LARYNGEAL 8/9 (MISCELLANEOUS) ×1 IMPLANT
ELECTRODE REM PT RTRN 9FT ADLT (ELECTROSURGICAL) ×1 IMPLANT
FORCEPS JEWEL BIP 4-3/4 STR (INSTRUMENTS) ×3 IMPLANT
GAUZE 4X4 16PLY RFD (DISPOSABLE) ×3 IMPLANT
GLOVE BIO SURGEON STRL SZ7.5 (GLOVE) ×6 IMPLANT
GOWN STRL REUS W/ TWL LRG LVL3 (GOWN DISPOSABLE) ×3 IMPLANT
GOWN STRL REUS W/TWL LRG LVL3 (GOWN DISPOSABLE) ×6
HEMOSTAT SURGICEL 2X3 (HEMOSTASIS) ×3 IMPLANT
HOOK STAY BLUNT/RETRACTOR 5M (MISCELLANEOUS) ×3 IMPLANT
KIT TURNOVER KIT A (KITS) ×3 IMPLANT
LABEL OR SOLS (LABEL) ×3 IMPLANT
NS IRRIG 500ML POUR BTL (IV SOLUTION) ×3 IMPLANT
PACK HEAD/NECK (MISCELLANEOUS) ×3 IMPLANT
PAD CLEANER CAUTERY TIP 5X5 (MISCELLANEOUS) ×2
PROBE NEUROSIGN BIPOL (MISCELLANEOUS) ×1 IMPLANT
PROBE NEUROSIGN BIPOLAR (MISCELLANEOUS) ×2
SHEARS HARMONIC 9CM CVD (BLADE) ×3 IMPLANT
SPONGE KITTNER 5P (MISCELLANEOUS) ×3 IMPLANT
STAPLER SKIN PROX 35W (STAPLE) ×3 IMPLANT
SUT SILK 2 0 (SUTURE) ×2
SUT SILK 2 0 SH (SUTURE) ×3 IMPLANT
SUT SILK 2-0 18XBRD TIE 12 (SUTURE) ×1 IMPLANT
SUT VIC AB 4-0 RB1 18 (SUTURE) ×6 IMPLANT
SYSTEM CHEST DRAIN TLS 7FR (DRAIN) ×1 IMPLANT

## 2018-11-17 NOTE — Transfer of Care (Signed)
Immediate Anesthesia Transfer of Care Note  Patient: Ruth Elliott  Procedure(s) Performed: THYROIDECTOMY-Hemi Thyroidectomy (Left )  Patient Location: PACU  Anesthesia Type:General  Level of Consciousness: sedated  Airway & Oxygen Therapy: Patient Spontanous Breathing and Patient connected to face mask oxygen  Post-op Assessment: Report given to RN and Post -op Vital signs reviewed and stable  Post vital signs: Reviewed and stable  Last Vitals:  Vitals Value Taken Time  BP 144/78 11/17/18 0858  Temp 36.4 C 11/17/18 0858  Pulse 74 11/17/18 0900  Resp 19 11/17/18 0900  SpO2 99 % 11/17/18 0900  Vitals shown include unvalidated device data.  Last Pain:  Vitals:   11/17/18 0858  TempSrc:   PainSc: Asleep         Complications: No apparent anesthesia complications

## 2018-11-17 NOTE — Telephone Encounter (Signed)
Thyroid surgery today with Dr. Tami Ribas BP elevated prior to and following surgery [id number] systolic and higher Given Hydralazine IV, 3 doses post op by anesthesia. Down to [id number] systolic with standing No significant pain reported  At 7 PM, patient at home, systolic pressure back up to [id number] systolic  We have ordered hydralazine 50 mg TID PRN for systolic pressure [id number] Friend will pick up the medication. She has a new BP cuff  Signed, Esmond Plants, MD, Ph.D [facility]

## 2018-11-17 NOTE — Anesthesia Postprocedure Evaluation (Signed)
Anesthesia Post Note  Patient: Ruth Elliott Call  Procedure(s) Performed: THYROIDECTOMY-Hemi Thyroidectomy (Left )  Patient location during evaluation: PACU Anesthesia Type: General Level of consciousness: awake and alert Pain management: pain level controlled Vital Signs Assessment: post-procedure vital signs reviewed and stable Respiratory status: spontaneous breathing, nonlabored ventilation, respiratory function stable and patient connected to nasal cannula oxygen Cardiovascular status: blood pressure returned to baseline and stable Postop Assessment: no apparent nausea or vomiting Anesthetic complications: no Comments: Patient was hypertensive in the PreOp area with history of known hypertension but no longer on medications.  Patient was given option to reschedule procedure for more optimal blood pressure control but declined.  Patient became hypertensive in the PACU.  Intra operative discussion with Dr. Tami Ribas was to try to keep systolic's less than 607 in the PACU.  Patient required treatment with IV hydralazine in the PACU to meet this goal.  Patient is family friend of Dr. Rockey Situ and his wife is to be the patients post operative chaperone. The patients blood pressure has now met our goal for the past 45 minutes without any further treatment. Patient given the option for overnight observation but declined. I spoke with both Dr. Tami Ribas and Dr. Rockey Situ.  The plan is for the patient will be discharged home. The patient will monitor her blood pressure at home.  Dr. Rockey Situ offered to treat any hypertension at home if needed.    The patient will follow up with he PCP for better blood pressure control.      Last Vitals:  Vitals:   11/17/18 1158 11/17/18 1206  BP: (!) 158/87 (!) 157/87  Pulse: 83 83  Resp: 12 14  Temp:  (!) 36 C  SpO2: 96% 98%    Last Pain:  Vitals:   11/17/18 1206  TempSrc:   PainSc: 0-No pain                 Precious Haws Piscitello

## 2018-11-17 NOTE — Anesthesia Preprocedure Evaluation (Signed)
Anesthesia Evaluation  Patient identified by MRN, date of birth, ID band Patient awake    Reviewed: Allergy & Precautions, H&P , NPO status , Patient's Chart, lab work & pertinent test results  History of Anesthesia Complications Negative for: history of anesthetic complications  Airway Mallampati: III  TM Distance: <3 FB Neck ROM: full    Dental  (+) Chipped   Pulmonary neg pulmonary ROS, neg shortness of breath,           Cardiovascular Exercise Tolerance: Good hypertension, (-) angina(-) Past MI and (-) DOE      Neuro/Psych negative neurological ROS  negative psych ROS   GI/Hepatic negative GI ROS, Neg liver ROS, neg GERD  ,  Endo/Other  negative endocrine ROS  Renal/GU      Musculoskeletal   Abdominal   Peds  Hematology negative hematology ROS (+)   Anesthesia Other Findings Past Medical History: No date: Allergy No date: Endometriosis     Comment:  Followed by Lynnell Chad- on Nortrel  Past Surgical History: No date: APPENDECTOMY No date: NASAL SINUS SURGERY No date: OVARIAN CYST SURGERY 1994: TONSILLECTOMY     Reproductive/Obstetrics negative OB ROS                             Anesthesia Physical Anesthesia Plan  ASA: III  Anesthesia Plan: General ETT   Post-op Pain Management:    Induction: Intravenous  PONV Risk Score and Plan: Ondansetron, Dexamethasone, Midazolam and Treatment may vary due to age or medical condition  Airway Management Planned: Oral ETT and Video Laryngoscope Planned  Additional Equipment:   Intra-op Plan:   Post-operative Plan: Extubation in OR  Informed Consent: I have reviewed the patients History and Physical, chart, labs and discussed the procedure including the risks, benefits and alternatives for the proposed anesthesia with the patient or authorized representative who has indicated his/her understanding and acceptance.      Dental Advisory Given  Plan Discussed with: Anesthesiologist, CRNA and Surgeon  Anesthesia Plan Comments: (Patient consented for risks of anesthesia including but not limited to:  - adverse reactions to medications - damage to teeth, lips or other oral mucosa - sore throat or hoarseness - Damage to heart, brain, lungs or loss of life  Patient voiced understanding.)        Anesthesia Quick Evaluation

## 2018-11-17 NOTE — OR Nursing (Signed)
Dr Amie Critchley and Dr Tami Ribas notified and reviewed vital signs from this am, no new orders at this time.

## 2018-11-17 NOTE — Anesthesia Post-op Follow-up Note (Signed)
Anesthesia QCDR form completed.        

## 2018-11-17 NOTE — Anesthesia Procedure Notes (Signed)
Procedure Name: Intubation Date/Time: 11/17/2018 7:26 AM Performed by: Jonna Clark, CRNA Pre-anesthesia Checklist: Patient identified, Patient being monitored, Timeout performed, Emergency Drugs available and Suction available Patient Re-evaluated:Patient Re-evaluated prior to induction Oxygen Delivery Method: Circle system utilized Preoxygenation: Pre-oxygenation with 100% oxygen Induction Type: IV induction Ventilation: Mask ventilation without difficulty Laryngoscope Size: 3 and McGraph Grade View: Grade I Tube type: Oral Tube size: 7.0 mm Number of attempts: 1 Airway Equipment and Method: Stylet Placement Confirmation: ETT inserted through vocal cords under direct vision,  positive ETCO2 and breath sounds checked- equal and bilateral Secured at: 23 cm Tube secured with: Tape Dental Injury: Teeth and Oropharynx as per pre-operative assessment

## 2018-11-17 NOTE — Op Note (Signed)
11/17/2018  8:53 AM    Ruth Elliott  ZV:[id number]   Pre-Op Dx: Left thyroid nodule  Post-op Dx: SAME  Proc: Left hemithyroidectomy; recurrent laryngeal nerve monitoring 1 hour  Surg:  Roena Malady   Assistant: Vaught  Anes:  GOT  EBL: Less than 20 cc  Comp: None  Findings: Approximately 3 to 4 cm cystic mass left inferior lobe of thyroid  Procedure: Shaunette was identified in the holding area take the operating room placed in supine position.  After intubation with a laryngeal nerve monitoring endotracheal tube which remained on throughout the procedure the neck was gently extended.  The mass was obvious in the left lobe of the thyroid.  A skin crease was identified and marked.  A local anesthetic of 1% lidocaine with 1 100,000's epinephrine was used to inject along the incision line a total of 5 cc was used.  Neck was then prepped and draped sterilely.  Incision was made down to and through the platysma.  Hemostasis achieved using the Bovie cautery.  Strap muscles identified in the midline and divided.  The large cystic mass was easily identified and the strap muscles were retracted laterally as any small bleeding vessels were divided using the harmonic scalpel.  The superior pole was also identified isolated and divided using the harmonic scalpel.  The gland was gently medialized with care taken not to rupture the cyst.  The superior and inferior parathyroid glands were identified and remained in the vascular pedicle.  The recurrent laryngeal nerve was identified in the tracheoesophageal groove was stimulated and dissected up to its entrance into the larynx.  The gland was medialized dividing all vessels with harmonic scalpel.  Barry's ligament was released using the microbipolar.  With a large cystic mass in the left lobe of the thyroid isolated the isthmus was divided using the harmonic scalpel.  A stitch was placed in the left upper lobe.  The wound was then copiously irrigated  with saline any small bleeding points were cauterized using the microbipolar.  With no active bleeding Surgicel was placed in the wound and a #10 TLS drain was brought out the wound inferiorly.  The strap muscles were reapproximated using 4-0 Vicryl the platysma and subcutaneous tissues were also closed using 4-0 Vicryl and the skin was closed using Dermabond.  A 3-0 nylon was used as a drain stitch.  The patient was then returned to anesthesia where she was awakened in the operating room trach carbon stable conditioon.    Specimen: Left hemithyroid  Dispo:   Good  Plan: Discharged home follow-up tomorrow for drain removal she will be instructed on how to empty and record the drain overnight.  Roena Malady  11/17/2018 8:53 AM

## 2018-11-17 NOTE — Discharge Instructions (Signed)
Open Thyroid Lobectomy, Care After This sheet gives you information about how to care for yourself after your procedure. Your health care provider may also give you more specific instructions. If you have problems or questions, contact your health care provider. What can I expect after the procedure? After the procedure, it is common to have:  Mild pain in the neck or upper body, especially when swallowing.  A sore throat.  A weak voice. Follow these instructions at home: Activity   Do not lift anything that is heavier than 10 lb (4.5 kg), or the limit that you are told, until your health care provider says that it is safe.  Do not jog, swim, or do other activities that take a lot of effort until your health care provider approves.  Return to your normal activities as told by your health care provider. Ask your health care provider what activities are safe for you. Medicines  Take over-the-counter and prescription medicines only as told by your health care provider.  Do not take medicines that contain aspirin and ibuprofen until your health care provider says that you can. These medicines can increase your risk of bleeding.  Do not drive or use heavy machinery while taking prescription pain medicine. Incision care  Follow instructions from your health care provider about how to take care of your incision. Make sure you: ? Wash your hands with soap and water before you change your bandage (dressing). If soap and water are not available, use hand sanitizer. ? Change your dressing as told by your health care provider. ? Leave stitches (sutures), skin glue, or adhesive strips in place. These skin closures may need to stay in place for 2 weeks or longer. If adhesive strip edges start to loosen and curl up, you may trim the loose edges. Do not remove adhesive strips completely unless your health care provider tells you to do that.  Check your incision area every day for signs of infection.  Check for: ? Redness, swelling, or pain. ? Fluid or blood. ? Warmth. ? Pus or a bad smell.  If you were sent home with a surgical drain tube in place, follow instructions from your health care provider about emptying it. General instructions  Follow instructions from your health care provider about eating or drinking restrictions. You may need to have only liquids and soft foods for a few days after the procedure.  Do not take baths, swim, or use a hot tub until your health care provider approves. Ask your health care provider when you can take showers.  If you are taking prescription pain medicine, take actions to prevent or treat constipation. Your health care provider may recommend that you: ? Drink enough fluid to keep your urine pale yellow. ? Eat foods that are high in fiber, such as fresh fruits and vegetables, whole grains, and beans.  If you need to have only liquids, try options such as fruit smoothies.  If you need to have soft foods, try options such as cooked fruits and vegetables and oatmeal. ? Limit foods that are high in fat and processed sugars, such as fried or sweet foods. ? Take an over-the-counter or prescription medicine for constipation.  Keep all follow-up visits as told by your health care provider. This is important. Contact a health care provider if:  The soreness in your throat gets worse.  You have redness, swelling, or pain around your incision.  You have fluid or blood coming from your incision.  Your incision  feels warm to the touch.  You have pus or a bad smell coming from your incision area.  You have a fever.  You feel light-headed, or you faint.  You have numbness, tingling, or muscle spasms in your arms, hands, feet, or face.  You have trouble swallowing. Get help right away if:  You develop a rash.  You have difficulty breathing.  You hear whistling noises coming from your chest.  You develop a cough that gets worse.  Your  speech changes, or you have hoarseness that gets worse. Summary  After the procedure, it is common to have a sore throat, a weak voice, and mild pain in the neck or upper body.  Follow instructions from your health care provider about eating or drinking. You may need to have only liquids and soft foods for a few days after the procedure.  Take over-the-counter and prescription medicines only as told by your health care provider.  Make sure you know the signs of a possible problem and when to contact your health care provider. This information is not intended to replace advice given to you by your health care provider. Make sure you discuss any questions you have with your health care provider. Document Released: 07/28/2013 Document Revised: 12/13/2016 Document Reviewed: 09/09/2016 Elsevier Patient Education  2020 [employer]   1) The drugs that you were given will stay in your system until tomorrow so for the next 24 hours you should not:  A) Drive an automobile B) Make any legal decisions C) Drink any alcoholic beverage   2) You may resume regular meals tomorrow.  Today it is better to start with liquids and gradually work up to solid foods.  You may eat anything you prefer, but it is better to start with liquids, then soup and crackers, and gradually work up to solid foods.   3) Please notify your doctor immediately if you have any unusual bleeding, trouble breathing, redness and pain at the surgery site, drainage, fever, or pain not relieved by medication.    4) Additional Instructions:        Please contact your physician with any problems or Same Day Surgery at [phone], Monday through Friday 6 am to 4 pm, or [place] at [facility] number at [phone].

## 2018-11-17 NOTE — H&P (Signed)
The patient's history has been reviewed, patient examined, no change in status, stable for surgery.  Questions were answered to the patients satisfaction.  

## 2018-11-19 ENCOUNTER — Telehealth: Payer: Self-pay | Admitting: Cardiovascular Disease

## 2018-11-19 LAB — SURGICAL PATHOLOGY

## 2018-11-19 NOTE — Telephone Encounter (Signed)
Pt c/o medication issue:  1. Name of Medication: Hydralazine  2. How are you currently taking this medication (dosage and times per day)? 50 mg 3 times a day as needed  3. Are you having a reaction (difficulty breathing--STAT)?   Bad headache and nausea and rash across forehead, but went away.   4. What is your medication issue?

## 2018-11-20 ENCOUNTER — Other Ambulatory Visit: Payer: Self-pay | Admitting: Cardiovascular Disease

## 2018-11-20 MED ORDER — AMLODIPINE BESYLATE 10 MG PO TABS: 10.0000 mg | ORAL_TABLET | Freq: Every day | ORAL | 3 refills | Status: DC

## 2018-11-20 NOTE — Telephone Encounter (Signed)
She might be having reaction to hydralazine , sometimes can cause a headache.  Do not know about the nausea If she is having sinus fullness would check in again with Dr. Tami Ribas she might have sinus infection she can stop the hydralazine medication  take only as needed for systolic pressure over 0000000   Would start amlodipine daily for high blood pressure I have sent in a 10 mg pill,  Would start on a 5 mg (half pill) for the first 5 days If blood pressure continues to run high would increase up to 10 mg pill  I have sent in 30 days but we can send in longer prescription once we find out the right dose and to make sure there is no side effects  Would watch for ankle swelling which can be a rare side effect Swelling can be treated with tight socks if needed, leg elevation  Signed, Esmond Plants, MD, Ph.D [facility]

## 2018-11-20 NOTE — Telephone Encounter (Signed)
I spoke with the patient regarding Dr. Donivan Scull recommendations as listed below.  The patient states she was on lisinopril before and it dropped her BP too low. I have asked her to montior her BP daily on the 5 mg dose of amlodipine and if after 5 days she feels this is helping to maintain her BP at a good number, she could remain on this dose with the understanding she can increase to 10 mg once daily on the amlodipine if needed.  She has also been advised to call us if she is becoming hypotensive on the amlodipine.   The patient voices understanding and is agreeable.

## 2018-11-26 ENCOUNTER — Ambulatory Visit: Payer: Managed Care, Other (non HMO) | Admitting: Cardiology

## 2018-12-04 ENCOUNTER — Encounter: Payer: Self-pay | Admitting: Family Medicine

## 2018-12-06 NOTE — Progress Notes (Signed)
Cardiology Office Note  Date:  12/08/2018   ID:  Ruth Elliott, DOB 1970/01/27, MRN [id number]  PCP:  Lucille Passy, MD   Chief Complaint  Patient presents with  . other    Follow up [facility]; HTN. Meds reviewed by the pt. verbally.  Former Dr. Yvone Neu patient. "doing well."     HPI:  [name] is a 48 year old woman with past medical history of Nodular goiter Recent thyroid surgery with Dr. Tami Ribas Who presents for evaluation of her hypertension   BP elevated prior to and following thyroid surgery [id number] systolic and higher Given Hydralazine IV, 3 doses post op by anesthesia. Down to [id number] systolic with standing at the time of discharge No significant surgical pain reported At 7 PM, patient at home, systolic pressure back up to [id number] systolic  She was started on hydralazine 50 mg TID PRN for systolic pressure [id number] Side effects on the hydralazine, had headaches  Changed to amlodipine 5 mg daily Since then blood pressure has been well controlled typically [id number] up to [id number] systolic on a regular basis  Reports having very low blood pressure on lisinopril in the past  EKG personally reviewed by myself on todays visit Shows normal sinus rhythm with rate 70 bpm nonspecific T wave abnormality 1 and aVL V6 seen previously on EKG 2017  CT scan reviewed showing no carotid atherosclerosis, no aortic arch atherosclerosis  Cholesterol very low on no medication, non-smoker, nondiabetic  PMH:   has a past medical history of Allergy and Endometriosis.  PSH:    Past Surgical History:  Procedure Laterality Date  . APPENDECTOMY    . NASAL SINUS SURGERY    . OVARIAN CYST SURGERY    . THYROIDECTOMY Left 11/17/2018   Procedure: THYROIDECTOMY-Hemi Thyroidectomy;  Surgeon: Beverly Gust, MD;  Location: [facility] ORS;  Service: ENT;  Laterality: Left;  . TONSILLECTOMY  1994    Current Outpatient Medications  Medication Sig Dispense Refill  . amLODipine (NORVASC) 5 MG tablet Take 1 tablet  (5 mg total) by mouth daily. 90 tablet 4  . COLLAGEN PO Take 1 Scoop by mouth daily. Collagen Peptide in coffee in am     . fexofenadine (ALLEGRA ALLERGY) 180 MG tablet Take 180 mg by mouth daily.    . Flax Oil-Fish Oil-Borage Oil (FISH OIL-FLAX OIL-BORAGE OIL) CAPS Take 2 capsules by mouth daily.     . fluticasone (FLONASE) 50 MCG/ACT nasal spray SHAKE LIQUID AND USE 2 SPRAYS IN EACH NOSTRIL DAILY 16 g 1  . norethindrone-ethinyl estradiol (MICROGESTIN,JUNEL,LOESTRIN) 1-20 MG-MCG tablet Take 1 tablet by mouth daily.  0  . HYDROcodone-Acetaminophen 5-300 MG TABS Take 1-2 tablets by mouth every 4 (four) hours as needed. (Patient not taking: Reported on 12/08/2018) 40 tablet 0  . levocetirizine (XYZAL) 5 MG tablet Take 1 tablet (5 mg total) by mouth every evening. (Patient not taking: Reported on 12/08/2018) 30 tablet 3   No current facility-administered medications for this visit.      Allergies:   Penicillins, Hydralazine, Lisinopril, and Singulair [montelukast sodium]   Social History:  The patient  reports that she has never smoked. She has never used smokeless tobacco. She reports current alcohol use. She reports that she does not use drugs.   Family History:   family history includes Alzheimer's disease in her father; COPD in her mother; Hypertension in her father and mother.    Review of Systems: Review of Systems  Constitutional: Negative.   HENT: Negative.   Respiratory:  Negative.   Cardiovascular: Negative.   Gastrointestinal: Negative.   Musculoskeletal: Negative.   Neurological: Negative.   Psychiatric/Behavioral: Negative.   All other systems reviewed and are negative.   PHYSICAL EXAM: VS:  BP (!) 180/98 (BP Location: Right Arm, Patient Position: Sitting, Cuff Size: Normal)   Pulse 70   Temp (!) 97.4 F (36.3 C)   Ht 5\' 6"  (1.676 m)   Wt 221 lb 8 oz (100.5 kg)   SpO2 98%   BMI 35.75 kg/m  , BMI Body mass index is 35.75 kg/m. GEN: Well nourished, well developed,  in no acute distress HEENT: normal Neck: no JVD, carotid bruits, or masses Cardiac: RRR; no murmurs, rubs, or gallops,no edema  Respiratory:  clear to auscultation bilaterally, normal work of breathing GI: soft, nontender, nondistended, + BS MS: no deformity or atrophy Skin: warm and dry, no rash Neuro:  Strength and sensation are intact Psych: euthymic mood, full affect   Recent Labs: 09/28/2018: ALT 10; BUN 12; Creatinine, Ser 0.63; Hemoglobin 14.7; Platelets 196; Potassium 3.9; Sodium 140; TSH 1.420    Lipid Panel Lab Results  Component Value Date   CHOL 113 09/28/2018   HDL 38 (L) 09/28/2018   LDLCALC 51 09/28/2018   TRIG 138 09/28/2018      Wt Readings from Last 3 Encounters:  12/08/18 221 lb 8 oz (100.5 kg)  11/12/18 214 lb (97.1 kg)  09/28/18 218 lb 3.2 oz (99 kg)       ASSESSMENT AND PLAN:  Problem List Items Addressed This Visit      Cardiology Problems   HTN (hypertension) - Primary   Relevant Medications   amLODipine (NORVASC) 5 MG tablet   Other Relevant Orders   EKG 12-Lead     Other   Nodular goiter     Hypertension Well-controlled on amlodipine 5 mg daily Whitecoat syndrome, markedly elevated blood pressure on today's visit No changes made just recommended she continue to monitor blood pressure at home She reports prior history of whitecoat syndrome.  Likely cause of her high blood pressure before and following recent surgery  Goiter Recent resection of cyst Recovering well  Disposition:   F/U as needed   Total encounter time more than 15 minutes  Greater than 50% was spent in counseling and coordination of care with the patient    Signed, Esmond Plants, M.D., Ph.D. [place], [place]

## 2018-12-07 ENCOUNTER — Ambulatory Visit: Payer: Managed Care, Other (non HMO) | Admitting: Family Medicine

## 2018-12-08 ENCOUNTER — Other Ambulatory Visit: Payer: Self-pay

## 2018-12-08 ENCOUNTER — Encounter: Payer: Self-pay | Admitting: Cardiovascular Disease

## 2018-12-08 ENCOUNTER — Ambulatory Visit (INDEPENDENT_AMBULATORY_CARE_PROVIDER_SITE_OTHER): Payer: Managed Care, Other (non HMO) | Admitting: Cardiovascular Disease

## 2018-12-08 VITALS — BP 180/98 | HR 70 | Temp 97.4°F | Ht 66.0 in | Wt 221.5 lb

## 2018-12-08 DIAGNOSIS — E049 Nontoxic goiter, unspecified: Secondary | ICD-10-CM | POA: Diagnosis not present

## 2018-12-08 DIAGNOSIS — I10 Essential (primary) hypertension: Secondary | ICD-10-CM | POA: Diagnosis not present

## 2018-12-08 MED ORDER — AMLODIPINE BESYLATE 5 MG PO TABS: 5.0000 mg | ORAL_TABLET | Freq: Every day | ORAL | 4 refills | Status: DC

## 2018-12-08 NOTE — Patient Instructions (Addendum)

## 2019-01-06 ENCOUNTER — Ambulatory Visit: Payer: Managed Care, Other (non HMO) | Admitting: Family Medicine

## 2019-01-06 NOTE — Progress Notes (Unsigned)
Virtual Visit via Video   Due to the COVID-19 pandemic, this visit was completed with telemedicine (audio/video) technology to reduce patient and provider exposure as well as to preserve personal protective equipment.   I connected with Ruth Elliott by a video enabled telemedicine application and verified that I am speaking with the correct person using two identifiers. Location patient: Home Location provider: [facility], Office Persons participating in the virtual visit: Ruth L Jerl Santos, MD   I discussed the limitations of evaluation and management by telemedicine and the availability of in person appointments. The patient expressed understanding and agreed to proceed.  Care Team   Patient Care Team: Lucille Passy, MD as PCP - General (Family Medicine)  Subjective:   HPI:    Lab Results  Component Value Date   TSH 1.420 09/28/2018   T3TOTAL 173 09/28/2018    ROS   Patient Active Problem List   Diagnosis Date Noted  . Nodular goiter 09/27/2018  . Well woman exam without gynecological exam 09/27/2018  . Neck mass 06/03/2018  . Allergic rhinitis 08/18/2017  . Dysfunction of both eustachian tubes 04/14/2017  . HTN (hypertension) 03/23/2015  . Obesity 08/31/2013    Social History   Tobacco Use  . Smoking status: Never Smoker  . Smokeless tobacco: Never Used  Substance Use Topics  . Alcohol use: Yes    Alcohol/week: 0.0 standard drinks    Current Outpatient Medications:  .  amLODipine (NORVASC) 5 MG tablet, Take 1 tablet (5 mg total) by mouth daily., Disp: 90 tablet, Rfl: 4 .  COLLAGEN PO, Take 1 Scoop by mouth daily. Collagen Peptide in coffee in am , Disp: , Rfl:  .  fexofenadine (ALLEGRA ALLERGY) 180 MG tablet, Take 180 mg by mouth daily., Disp: , Rfl:  .  Flax Oil-Fish Oil-Borage Oil (FISH OIL-FLAX OIL-BORAGE OIL) CAPS, Take 2 capsules by mouth daily. , Disp: , Rfl:  .  fluticasone (FLONASE) 50 MCG/ACT nasal spray, SHAKE LIQUID AND USE 2  SPRAYS IN EACH NOSTRIL DAILY, Disp: 16 g, Rfl: 1 .  levocetirizine (XYZAL) 5 MG tablet, Take 1 tablet (5 mg total) by mouth every evening., Disp: 30 tablet, Rfl: 3 .  norethindrone-ethinyl estradiol (MICROGESTIN,JUNEL,LOESTRIN) 1-20 MG-MCG tablet, Take 1 tablet by mouth daily., Disp: , Rfl: 0 .  HYDROcodone-Acetaminophen 5-300 MG TABS, Take 1-2 tablets by mouth every 4 (four) hours as needed. (Patient not taking: Reported on 12/08/2018), Disp: 40 tablet, Rfl: 0  Allergies  Allergen Reactions  . Penicillins Shortness Of Breath    Did it involve swelling of the face/tongue/throat, SOB, or low BP? Yes Did it involve sudden or severe rash/hives, skin peeling, or any reaction on the inside of your mouth or nose? No Did you need to seek medical attention at a hospital or doctor's office? Yes When did it last happen?childhood If all above answers are "NO", may proceed with cephalosporin use.   [name] Hydralazine     Elevated blood pressure, migraine, and lethary  . Lisinopril     paresthesia  . Singulair [Montelukast Sodium]     Moody    Objective:   VITALS: Per patient if applicable, see vitals. GENERAL: Alert, appears well and in no acute distress. HEENT: Atraumatic, conjunctiva clear, no obvious abnormalities on inspection of external nose and ears. NECK: Normal movements of the head and neck. CARDIOPULMONARY: No increased WOB. Speaking in clear sentences. I:E ratio WNL.  MS: Moves all visible extremities without noticeable abnormality. PSYCH: Pleasant and  cooperative, well-groomed. Speech normal rate and rhythm. Affect is appropriate. Insight and judgement are appropriate. Attention is focused, linear, and appropriate.  NEURO: CN grossly intact. Oriented as arrived to appointment on time with no prompting. Moves both UE equally.  SKIN: No obvious lesions, wounds, erythema, or cyanosis noted on face or hands.  Depression screen [facility] 2/9 09/28/2018 09/28/2018 08/18/2017  Decreased Interest  0 0 0  Down, Depressed, Hopeless 0 0 0  PHQ - 2 Score 0 0 0  Difficult doing work/chores - Not difficult at all -     . COVID-19 Education: The signs and symptoms of COVID-19 were discussed with the patient and how to seek care for testing if needed. The importance of social distancing was discussed today. . Reviewed expectations re: course of current medical issues. . Discussed self-management of symptoms. . Outlined signs and symptoms indicating need for more acute intervention. . Patient verbalized understanding and all questions were answered. [name] Health Maintenance issues including appropriate healthy diet, exercise, and smoking avoidance were discussed with patient. . See orders for this visit as documented in the electronic medical record.  Arnette Norris, MD  Records requested if needed. Time spent: *** minutes, of which >50% was spent in obtaining information about her symptoms, reviewing her previous labs, evaluations, and treatments, counseling her about her condition (please see the discussed topics above), and developing a plan to further investigate it; she had a number of questions which I addressed.   Lab Results  Component Value Date   WBC 11.1 (H) 09/28/2018   HGB 14.7 09/28/2018   HCT 42.5 09/28/2018   PLT 196 09/28/2018   GLUCOSE 87 09/28/2018   CHOL 113 09/28/2018   TRIG 138 09/28/2018   HDL 38 (L) 09/28/2018   LDLCALC 51 09/28/2018   ALT 10 09/28/2018   AST 18 09/28/2018   NA 140 09/28/2018   K 3.9 09/28/2018   CL 102 09/28/2018   CREATININE 0.63 09/28/2018   BUN 12 09/28/2018   CO2 22 09/28/2018   TSH 1.420 09/28/2018   HGBA1C 5.3 09/28/2018    Lab Results  Component Value Date   TSH 1.420 09/28/2018   Lab Results  Component Value Date   WBC 11.1 (H) 09/28/2018   HGB 14.7 09/28/2018   HCT 42.5 09/28/2018   MCV 94 09/28/2018   PLT 196 09/28/2018   Lab Results  Component Value Date   NA 140 09/28/2018   K 3.9 09/28/2018   CO2 22 09/28/2018    GLUCOSE 87 09/28/2018   BUN 12 09/28/2018   CREATININE 0.63 09/28/2018   BILITOT 0.5 09/28/2018   ALKPHOS 61 09/28/2018   AST 18 09/28/2018   ALT 10 09/28/2018   PROT 7.2 09/28/2018   ALBUMIN 4.6 09/28/2018   CALCIUM 9.2 09/28/2018   Lab Results  Component Value Date   CHOL 113 09/28/2018   Lab Results  Component Value Date   HDL 38 (L) 09/28/2018   Lab Results  Component Value Date   LDLCALC 51 09/28/2018   Lab Results  Component Value Date   TRIG 138 09/28/2018   Lab Results  Component Value Date   CHOLHDL 3.0 09/28/2018   Lab Results  Component Value Date   HGBA1C 5.3 09/28/2018       Assessment & Plan:   Problem List Items Addressed This Visit    None      I am having Ruth Elliott maintain her Fish Oil-Flax Oil-Borage Oil, norethindrone-ethinyl estradiol, fexofenadine, COLLAGEN PO,  levocetirizine, fluticasone, HYDROcodone-Acetaminophen, and amLODipine.  No orders of the defined types were placed in this encounter.    Arnette Norris, MD

## 2019-01-07 ENCOUNTER — Encounter: Payer: Self-pay | Admitting: Family Medicine

## 2019-01-07 ENCOUNTER — Other Ambulatory Visit: Payer: Self-pay

## 2019-01-07 ENCOUNTER — Ambulatory Visit: Payer: Managed Care, Other (non HMO) | Admitting: Family Medicine

## 2019-01-11 ENCOUNTER — Ambulatory Visit: Payer: Managed Care, Other (non HMO)

## 2019-01-13 ENCOUNTER — Ambulatory Visit: Payer: Managed Care, Other (non HMO) | Admitting: Family Medicine

## 2019-01-13 ENCOUNTER — Encounter: Payer: Self-pay | Admitting: Family Medicine

## 2019-08-09 ENCOUNTER — Other Ambulatory Visit: Payer: Self-pay

## 2019-08-09 ENCOUNTER — Encounter: Payer: Self-pay | Admitting: Nurse Practitioner

## 2019-08-09 ENCOUNTER — Telehealth: Payer: Self-pay | Admitting: Nurse Practitioner

## 2019-08-09 ENCOUNTER — Ambulatory Visit: Payer: Managed Care, Other (non HMO) | Admitting: Nurse Practitioner

## 2019-08-09 VITALS — BP 210/140 | HR 77 | Temp 99.1°F | Ht 66.0 in | Wt 228.0 lb

## 2019-08-09 DIAGNOSIS — R748 Abnormal levels of other serum enzymes: Secondary | ICD-10-CM

## 2019-08-09 DIAGNOSIS — L729 Follicular cyst of the skin and subcutaneous tissue, unspecified: Secondary | ICD-10-CM

## 2019-08-09 DIAGNOSIS — Z1159 Encounter for screening for other viral diseases: Secondary | ICD-10-CM | POA: Diagnosis not present

## 2019-08-09 DIAGNOSIS — I10 Essential (primary) hypertension: Secondary | ICD-10-CM | POA: Diagnosis not present

## 2019-08-09 DIAGNOSIS — Z6836 Body mass index (BMI) 36.0-36.9, adult: Secondary | ICD-10-CM | POA: Insufficient documentation

## 2019-08-09 NOTE — Patient Instructions (Addendum)
Please go to the lab today.  When we get the results, we will complete  your employee health form.  Relax today and have a wonderful day. Stay on your amlodipine and monitor your BP at home.   I placed referral to dermatology for you to get that cyst removed on your back.  I placed referral into the [facility]. Af few dietary sheets to review today. Low glycemic index foods. Dash diet- for HTN.   Please follow-up in October for recheck and we can set up a colonoscopy for the future.     Hypertension, Adult High blood pressure (hypertension) is when the force of blood pumping through the arteries is too strong. The arteries are the blood vessels that carry blood from the heart throughout the body. Hypertension forces the heart to work harder to pump blood and may cause arteries to become narrow or stiff. Untreated or uncontrolled hypertension can cause a heart attack, heart failure, a stroke, kidney disease, and other problems. A blood pressure reading consists of a higher number over a lower number. Ideally, your blood pressure should be below 120/80. The first ("top") number is called the systolic pressure. It is a measure of the pressure in your arteries as your heart beats. The second ("bottom") number is called the diastolic pressure. It is a measure of the pressure in your arteries as the heart relaxes. What are the causes? The exact cause of this condition is not known. There are some conditions that result in or are related to high blood pressure. What increases the risk? Some risk factors for high blood pressure are under your control. The following factors may make you more likely to develop this condition:  Smoking.  Having type 2 diabetes mellitus, high cholesterol, or both.  Not getting enough exercise or physical activity.  Being overweight.  Having too much fat, sugar, calories, or salt (sodium) in your diet.  Drinking too much alcohol. Some risk factors for  high blood pressure may be difficult or impossible to change. Some of these factors include:  Having chronic kidney disease.  Having a family history of high blood pressure.  Age. Risk increases with age.  Race. You may be at higher risk if you are African American.  Gender. Men are at higher risk than women before age 72. After age 57, women are at higher risk than men.  Having obstructive sleep apnea.  Stress. What are the signs or symptoms? High blood pressure may not cause symptoms. Very high blood pressure (hypertensive crisis) may cause:  Headache.  Anxiety.  Shortness of breath.  Nosebleed.  Nausea and vomiting.  Vision changes.  Severe chest pain.  Seizures. How is this diagnosed? This condition is diagnosed by measuring your blood pressure while you are seated, with your arm resting on a flat surface, your legs uncrossed, and your feet flat on the floor. The cuff of the blood pressure monitor will be placed directly against the skin of your upper arm at the level of your heart. It should be measured at least twice using the same arm. Certain conditions can cause a difference in blood pressure between your right and left arms. Certain factors can cause blood pressure readings to be lower or higher than normal for a short period of time:  When your blood pressure is higher when you are in a health care provider's office than when you are at home, this is called white coat hypertension. Most people with this condition do  not need medicines.  When your blood pressure is higher at home than when you are in a health care provider's office, this is called masked hypertension. Most people with this condition may need medicines to control blood pressure. If you have a high blood pressure reading during one visit or you have normal blood pressure with other risk factors, you may be asked to:  Return on a different day to have your blood pressure checked again.  Monitor your  blood pressure at home for 1 week or longer. If you are diagnosed with hypertension, you may have other blood or imaging tests to help your health care provider understand your overall risk for other conditions. How is this treated? This condition is treated by making healthy lifestyle changes, such as eating healthy foods, exercising more, and reducing your alcohol intake. Your health care provider may prescribe medicine if lifestyle changes are not enough to get your blood pressure under control, and if:  Your systolic blood pressure is above 130.  Your diastolic blood pressure is above 80. Your personal target blood pressure may vary depending on your medical conditions, your age, and other factors. Follow these instructions at home: Eating and drinking   Eat a diet that is high in fiber and potassium, and low in sodium, added sugar, and fat. An example eating plan is called the DASH (Dietary Approaches to Stop Hypertension) diet. To eat this way: ? Eat plenty of fresh fruits and vegetables. Try to fill one half of your plate at each meal with fruits and vegetables. ? Eat whole grains, such as whole-wheat pasta, brown rice, or whole-grain bread. Fill about one fourth of your plate with whole grains. ? Eat or drink low-fat dairy products, such as skim milk or low-fat yogurt. ? Avoid fatty cuts of meat, processed or cured meats, and poultry with skin. Fill about one fourth of your plate with lean proteins, such as fish, chicken without skin, beans, eggs, or tofu. ? Avoid pre-made and processed foods. These tend to be higher in sodium, added sugar, and fat.  Reduce your daily sodium intake. Most people with hypertension should eat less than 1,500 mg of sodium a day.  Do not drink alcohol if: ? Your health care provider tells you not to drink. ? You are pregnant, may be pregnant, or are planning to become pregnant.  If you drink alcohol: ? Limit how much you use to:  0-1 drink a day for  women.  0-2 drinks a day for men. ? Be aware of how much alcohol is in your drink. In the [place] one drink equals one 12 oz bottle of beer (355 mL), one 5 oz glass of wine (148 mL), or one 1 oz glass of hard liquor (44 mL). Lifestyle   Work with your health care provider to maintain a healthy body weight or to lose weight. Ask what an ideal weight is for you.  Get at least 30 minutes of exercise most days of the week. Activities may include walking, swimming, or biking.  Include exercise to strengthen your muscles (resistance exercise), such as Pilates or lifting weights, as part of your weekly exercise routine. Try to do these types of exercises for 30 minutes at least 3 days a week.  Do not use any products that contain nicotine or tobacco, such as cigarettes, e-cigarettes, and chewing tobacco. If you need help quitting, ask your health care provider.  Monitor your blood pressure at home as told by your health care  provider.  Keep all follow-up visits as told by your health care provider. This is important. Medicines  Take over-the-counter and prescription medicines only as told by your health care provider. Follow directions carefully. Blood pressure medicines must be taken as prescribed.  Do not skip doses of blood pressure medicine. Doing this puts you at risk for problems and can make the medicine less effective.  Ask your health care provider about side effects or reactions to medicines that you should watch for. Contact a health care provider if you:  Think you are having a reaction to a medicine you are taking.  Have headaches that keep coming back (recurring).  Feel dizzy.  Have swelling in your ankles.  Have trouble with your vision. Get help right away if you:  Develop a severe headache or confusion.  Have unusual weakness or numbness.  Feel faint.  Have severe pain in your chest or abdomen.  Vomit repeatedly.  Have trouble  breathing. Summary  Hypertension is when the force of blood pumping through your arteries is too strong. If this condition is not controlled, it may put you at risk for serious complications.  Your personal target blood pressure may vary depending on your medical conditions, your age, and other factors. For most people, a normal blood pressure is less than 120/80.  Hypertension is treated with lifestyle changes, medicines, or a combination of both. Lifestyle changes include losing weight, eating a healthy, low-sodium diet, exercising more, and limiting alcohol. This information is not intended to replace advice given to you by your health care provider. Make sure you discuss any questions you have with your health care provider. Document Revised: 09/10/2017 Document Reviewed: 09/10/2017 Elsevier Patient Education  [place] DASH stands for "Dietary Approaches to Stop Hypertension." The DASH eating plan is a healthy eating plan that has been shown to reduce high blood pressure (hypertension). It may also reduce your risk for type 2 diabetes, heart disease, and stroke. The DASH eating plan may also help with weight loss. What are tips for following this plan?  General guidelines  Avoid eating more than 2,300 mg (milligrams) of salt (sodium) a day. If you have hypertension, you may need to reduce your sodium intake to 1,500 mg a day.  Limit alcohol intake to no more than 1 drink a day for nonpregnant women and 2 drinks a day for men. One drink equals 12 oz of beer, 5 oz of wine, or 1 oz of hard liquor.  Work with your health care provider to maintain a healthy body weight or to lose weight. Ask what an ideal weight is for you.  Get at least 30 minutes of exercise that causes your heart to beat faster (aerobic exercise) most days of the week. Activities may include walking, swimming, or biking.  Work with your health care provider or diet and nutrition specialist  (dietitian) to adjust your eating plan to your individual calorie needs. Reading food labels   Check food labels for the amount of sodium per serving. Choose foods with less than 5 percent of the Daily Value of sodium. Generally, foods with less than 300 mg of sodium per serving fit into this eating plan.  To find whole grains, look for the word "whole" as the first word in the ingredient list. Shopping  Buy products labeled as "low-sodium" or "no salt added."  Buy fresh foods. Avoid canned foods and premade or frozen meals. Cooking  Avoid adding salt when cooking.  Use salt-free seasonings or herbs instead of table salt or sea salt. Check with your health care provider or pharmacist before using salt substitutes.  Do not fry foods. Cook foods using healthy methods such as baking, boiling, grilling, and broiling instead.  Cook with heart-healthy oils, such as olive, canola, soybean, or sunflower oil. Meal planning  Eat a balanced diet that includes: ? 5 or more servings of fruits and vegetables each day. At each meal, try to fill half of your plate with fruits and vegetables. ? Up to 6-8 servings of whole grains each day. ? Less than 6 oz of lean meat, poultry, or fish each day. A 3-oz serving of meat is about the same size as a deck of cards. One egg equals 1 oz. ? 2 servings of low-fat dairy each day. ? A serving of nuts, seeds, or beans 5 times each week. ? Heart-healthy fats. Healthy fats called Omega-3 fatty acids are found in foods such as flaxseeds and coldwater fish, like sardines, salmon, and mackerel.  Limit how much you eat of the following: ? Canned or prepackaged foods. ? Food that is high in trans fat, such as fried foods. ? Food that is high in saturated fat, such as fatty meat. ? Sweets, desserts, sugary drinks, and other foods with added sugar. ? Full-fat dairy products.  Do not salt foods before eating.  Try to eat at least 2 vegetarian meals each week.  Eat  more home-cooked food and less restaurant, buffet, and fast food.  When eating at a restaurant, ask that your food be prepared with less salt or no salt, if possible. What foods are recommended? The items listed may not be a complete list. Talk with your dietitian about what dietary choices are best for you. Grains Whole-grain or whole-wheat bread. Whole-grain or whole-wheat pasta. Brown rice. [place]. Bulgur. Whole-grain and low-sodium cereals. Pita bread. Low-fat, low-sodium crackers. Whole-wheat flour tortillas. Vegetables Fresh or frozen vegetables (raw, steamed, roasted, or grilled). Low-sodium or reduced-sodium tomato and vegetable juice. Low-sodium or reduced-sodium tomato sauce and tomato paste. Low-sodium or reduced-sodium canned vegetables. Fruits All fresh, dried, or frozen fruit. Canned fruit in natural juice (without added sugar). Meat and other protein foods Skinless chicken or [place]. Ground chicken or [place]. Pork with fat trimmed off. Fish and seafood. Egg whites. Dried beans, peas, or lentils. Unsalted nuts, nut butters, and seeds. Unsalted canned beans. Lean cuts of beef with fat trimmed off. Low-sodium, lean deli meat. Dairy Low-fat (1%) or fat-free (skim) milk. Fat-free, low-fat, or reduced-fat cheeses. Nonfat, low-sodium ricotta or cottage cheese. Low-fat or nonfat yogurt. Low-fat, low-sodium cheese. Fats and oils Soft margarine without trans fats. Vegetable oil. Low-fat, reduced-fat, or light mayonnaise and salad dressings (reduced-sodium). Canola, safflower, olive, soybean, and sunflower oils. Avocado. Seasoning and other foods Herbs. Spices. Seasoning mixes without salt. Unsalted popcorn and pretzels. Fat-free sweets. What foods are not recommended? The items listed may not be a complete list. Talk with your dietitian about what dietary choices are best for you. Grains Baked goods made with fat, such as croissants, muffins, or some breads. Dry pasta or rice meal  packs. Vegetables Creamed or fried vegetables. Vegetables in a cheese sauce. Regular canned vegetables (not low-sodium or reduced-sodium). Regular canned tomato sauce and paste (not low-sodium or reduced-sodium). Regular tomato and vegetable juice (not low-sodium or reduced-sodium). Angie Fava. Olives. Fruits Canned fruit in a light or heavy syrup. Fried fruit. Fruit in cream or butter sauce. Meat and other protein foods Fatty  cuts of meat. Ribs. Fried meat. Berniece Salines. Sausage. Bologna and other processed lunch meats. Salami. Fatback. Hotdogs. Bratwurst. Salted nuts and seeds. Canned beans with added salt. Canned or smoked fish. Whole eggs or egg yolks. Chicken or [place] with skin. Dairy Whole or 2% milk, cream, and half-and-half. Whole or full-fat cream cheese. Whole-fat or sweetened yogurt. Full-fat cheese. Nondairy creamers. Whipped toppings. Processed cheese and cheese spreads. Fats and oils Butter. Stick margarine. Lard. Shortening. Ghee. Bacon fat. Tropical oils, such as coconut, palm kernel, or palm oil. Seasoning and other foods Salted popcorn and pretzels. Onion salt, garlic salt, seasoned salt, table salt, and sea salt. Worcestershire sauce. Tartar sauce. Barbecue sauce. Teriyaki sauce. Soy sauce, including reduced-sodium. Steak sauce. Canned and packaged gravies. Fish sauce. Oyster sauce. Cocktail sauce. Horseradish that you find on the shelf. Ketchup. Mustard. Meat flavorings and tenderizers. Bouillon cubes. Hot sauce and Tabasco sauce. Premade or packaged marinades. Premade or packaged taco seasonings. Relishes. Regular salad dressings. Where to find more information:  National Heart, Lung, and [place]: https://wilson-eaton.com/  American Heart Association: www.heart.org Summary  The DASH eating plan is a healthy eating plan that has been shown to reduce high blood pressure (hypertension). It may also reduce your risk for type 2 diabetes, heart disease, and stroke.  With the DASH eating  plan, you should limit salt (sodium) intake to 2,300 mg a day. If you have hypertension, you may need to reduce your sodium intake to 1,500 mg a day.  When on the DASH eating plan, aim to eat more fresh fruits and vegetables, whole grains, lean proteins, low-fat dairy, and heart-healthy fats.  Work with your health care provider or diet and nutrition specialist (dietitian) to adjust your eating plan to your individual calorie needs. This information is not intended to replace advice given to you by your health care provider. Make sure you discuss any questions you have with your health care provider. Document Revised: 12/13/2016 Document Reviewed: 12/25/2015 Elsevier Patient Education  2020 [employer].

## 2019-08-09 NOTE — Telephone Encounter (Signed)
Pt said she gave paperwork to be filled out for her health screen for work. She said she did not get it back and would like to know if it is being filled out and will you call her to come back and pick it up?

## 2019-08-09 NOTE — Progress Notes (Signed)
Saw Dr. Marjory Lies in the past I practitioners here   New Patient Office Visit  Subjective:  Patient ID: Ruth Elliott, female    DOB: [birth year]  Age: 49 y.o. MRN: [id number]  CC:  Chief Complaint  Patient presents with  . Transitions Of Care    discuss weight gain and possible thyroid issue    HPI Ruth Elliott is a 49 yo with history of hypertension, allergic rhinitis, nodular goiter, left thyroidectomy, obesity presents to establish care with a new medical provider.  She was previously followed by Dr. Arnette Norris. She is concerned about weight gain and requests a referral to dermatology to remove a cyst on her back that is getting larger.    BMI: 36.80/obesity: Patient has been working on exercise, [employer], healthy eating, and continues to have weight creep over the years.  She is not sure if this is related to her thyroid or not would like her labs checked.  She did have a recent TSH and it was normal.    Wt Readings from Last 3 Encounters:  08/09/19 (!) 228 lb (103.4 kg)  12/08/18 221 lb 8 oz (100.5 kg)  11/12/18 214 lb (97.1 kg)    Essential hypertension/  severe white coat syndrome: On amlodipine 5 mg daily with blood pressures running 130's over 70's at home.  Patient reports her blood pressure is often as high as it is today.  We repeated it at rest it was still 200/142.  Patient says as soon she gets out to her car, and relaxes it will go down to her normal.  She reports she is asymptomatic.  Patient reports this has been her pattern for many years.  She reports that is always highest when she comes into a new provider's office like today.  BP Readings from Last 3 Encounters:  08/09/19 (!) 210/140  01/13/19 (!) 128/91  01/07/19 121/78    She is followed by Dr. Rockey Situ in cardiology.  She last saw him 12/08/2018.  He reports that she has had very low blood pressure on lisinopril in the past. She was given hydralazine 50 mg 3 times a day as needed if her systolic pressure  got over 150 but she had side effects with headaches.  Blood pressure was 180/98, heart rate 70.  EKG on 12/08/2018 reported at sinus rhythm 70, nonspecific T wave abnormality previously on EKG 2017.  CT scan revealed as showing no carotid atherosclerosis, no aortic arch atherosclerosis.  Cholesterol very low on no medication, non-smoker nondiabetic.   Hypothyroidism, left hemithyroidectomy:  She is followed by [facility] endocrinology Dr. Honor Junes. She is not on hormone therapy. 06/18/19:  TSH : 1.892, Free T4: 0.79.   Past Medical History:  Diagnosis Date  . Allergic rhinitis   . Allergy   . Endometriosis    Followed by [facility]- on Nortrel  . Thyroid disease   . White coat syndrome with hypertension     Past Surgical History:  Procedure Laterality Date  . APPENDECTOMY    . NASAL SINUS SURGERY    . OVARIAN CYST SURGERY    . THYROIDECTOMY Left 11/17/2018   Procedure: THYROIDECTOMY-Hemi Thyroidectomy;  Surgeon: Beverly Gust, MD;  Location: [facility] ORS;  Service: ENT;  Laterality: Left;  . TONSILLECTOMY  1994    Family History  Problem Relation Age of Onset  . COPD Mother   . Hypertension Mother   . Alzheimer's disease Father   . Hypertension Father     Social History  Socioeconomic History  . Marital status: Single    Spouse name: Not on file  . Number of children: Not on file  . Years of education: Not on file  . Highest education level: Not on file  Occupational History  . Not on file  Tobacco Use  . Smoking status: Never Smoker  . Smokeless tobacco: Never Used  Vaping Use  . Vaping Use: Never used  Substance and Sexual Activity  . Alcohol use: Yes    Alcohol/week: 0.0 standard drinks    Comment: 1-2 per month beer  . Drug use: No  . Sexual activity: Not Currently  Other Topics Concern  . 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  Recently moved here from [place].   G0   Single , lives alone, 2 dogs and 2 horses      Social Determinants of Health   Financial Resource Strain:   . Difficulty of Paying Living Expenses:   Food Insecurity:   . Worried About Charity fundraiser in the Last Year:   . Arboriculturist in the Last Year:   Transportation Needs:   . Film/video editor (Medical):   [name] Lack of Transportation (Non-Medical):   Physical Activity:   . Days of Exercise per Week:   . Minutes of Exercise per Session:   Stress:   . Feeling of Stress :   Social Connections:   . Frequency of Communication with Friends and Family:   . Frequency of Social Gatherings with Friends and Family:   . Attends Religious Services:   . Active Member of Clubs or Organizations:   . Attends Archivist Meetings:   [name] Marital Status:   Intimate Partner Violence:   . Fear of Current or Ex-Partner:   . Emotionally Abused:   [name] Physically Abused:   . Sexually Abused:     ROS Review of Systems  Constitutional: Negative for chills and fever.  HENT: Positive for sinus pressure.   Eyes: Negative.   Respiratory: Negative for cough, shortness of breath and wheezing.   Cardiovascular: Negative for chest pain, palpitations and leg swelling.  Gastrointestinal: Negative.   Endocrine: Negative for cold intolerance and heat intolerance.  Genitourinary: Negative for vaginal discharge.  Musculoskeletal: Negative.   Skin:       Cyst back she would like to get looked at.  Skin lower ext from riding pants and boots- splotchy   Allergic/Immunologic: Positive for environmental allergies.  Neurological: Negative for dizziness and light-headedness.  Psychiatric/Behavioral: Negative.        No concerns about depression/anxiety.    Objective:   Today's Vitals: BP (!) 210/140 (BP Location: Left Arm, Patient Position: Sitting, Cuff Size: Normal)   Pulse 77   Temp 99.1 F (37.3 C) (Oral)   Ht 5\' 6"  (1.676 m)   Wt (!) 228 lb (103.4 kg)   SpO2 98%   BMI 36.80 kg/m   Physical Exam Vitals reviewed.   Constitutional:      Appearance: Normal appearance.  HENT:     Head: Normocephalic.  Eyes:     Conjunctiva/sclera: Conjunctivae normal.     Pupils: Pupils are equal, round, and reactive to light.  Cardiovascular:     Rate and Rhythm: Normal rate and regular rhythm.     Pulses: Normal pulses.     Heart sounds: Normal heart sounds. No murmur heard.   Pulmonary:     Effort: Pulmonary effort is normal.  Breath sounds: Normal breath sounds.  Abdominal:     Palpations: Abdomen is soft.     Tenderness: There is no abdominal tenderness.  Musculoskeletal:        General: Normal range of motion.     Cervical back: Normal range of motion.  Skin:    General: Skin is warm and dry.     Comments: Small cyst on her upper left back.   Neurological:     General: No focal deficit present.     Mental Status: She is alert and oriented to person, place, and time.  Psychiatric:        Mood and Affect: Mood normal.        Behavior: Behavior normal.        Thought Content: Thought content normal.        Judgment: Judgment normal.     Assessment & Plan:   Problem List Items Addressed This Visit      Cardiovascular and Mediastinum   Essential hypertension - Primary    White coat syndrome: The patient presents with alarmingly elevated blood pressure which she laughs off. She says it has  been like this for many years and her non invasieve cardiac evaluation continues to return unremarkable.  She has absolutely no symptoms related to this and insists that it is just a transient number and will come down very quickly when she leaves the office.  She has not worn a 24-hour continuous home blood pressure monitoring device.   Patient advised to go home and relax.  Continue on her amlodipine 5 mg daily.  She will continue to monitor blood pressure at home.We repeated her BP and still elevated. Dash diet- for HTN.   BP Readings from Last 3 Encounters:  08/09/19 (!) 210/140  01/13/19 (!) 128/91   01/07/19 121/78         Relevant Orders   Comprehensive metabolic panel (Completed)   Lipid panel (Completed)     Musculoskeletal and Integument   Skin cyst    I placed referral to dermatology for you to get that cyst removed on your back.      Relevant Orders   Ambulatory referral to Dermatology     Other   Encounter for hepatitis C screening test for low risk patient   Relevant Orders   Hepatitis C antibody (Completed)   Adult BMI 36.0-36.9 kg/sq m    Weight gain 14 lbs.  Wt Readings from Last 3 Encounters:  08/09/19 (!) 228 lb (103.4 kg)  12/08/18 221 lb 8 oz (100.5 kg)  11/12/18 214 lb (97.1 kg)   I placed referral into the [facility]. Af few dietary sheets to review today. Low glycemic index foods      Relevant Orders   CBC with Differential/Platelet (Completed)   Comprehensive metabolic panel (Completed)   Hemoglobin A1c (Completed)   Lipid panel (Completed)   Amb Ref to Medical Weight Management   Hepatic function panel   Elevated liver enzymes    New onset x 1. Asx.  Weight gain- may be cause. Recheck in 1 month.       Relevant Orders   Hepatic function panel      Outpatient Encounter Medications as of 08/09/2019  Medication Sig  . amLODipine (NORVASC) 5 MG tablet Take 1 tablet (5 mg total) by mouth daily.  . cholecalciferol (VITAMIN D3) 25 MCG (1000 UNIT) tablet Take 1,000 Units by mouth daily.  . COLLAGEN PO Take 1 Scoop by mouth  daily. Collagen Peptide in coffee in am   . Flax Oil-Fish Oil-Borage Oil (FISH OIL-FLAX OIL-BORAGE OIL) CAPS Take 2 capsules by mouth daily.   . fluticasone (FLONASE) 50 MCG/ACT nasal spray SHAKE LIQUID AND USE 2 SPRAYS IN EACH NOSTRIL DAILY  . levocetirizine (XYZAL) 5 MG tablet Take 1 tablet (5 mg total) by mouth every evening.  . norethindrone-ethinyl estradiol (MICROGESTIN,JUNEL,LOESTRIN) 1-20 MG-MCG tablet Take 1 tablet by mouth daily.  . [DISCONTINUED] fexofenadine (ALLEGRA ALLERGY) 180 MG tablet Take 180 mg  by mouth daily.   No facility-administered encounter medications on file as of 08/09/2019.   Very pleasant patient who feels well.  We had a long discussion about her blood pressure and she is insistent that it will immediately drop as soon as she walks out of this clinic.  Patient has no chest pain pressure heaviness tightness dizziness or lightheadedness.  She reports she has had no secondary hypertension problems.  Routine labs today.  She has an employee health form that needs to be completed.  Please follow-up in October for recheck and we can set up a colonoscopy for the future.   Follow-up: Return in about 3 months (around 11/09/2019).  This visit occurred during the SARS-CoV-2 public health emergency.  Safety protocols were in place, including screening questions prior to the visit, additional usage of staff PPE, and extensive cleaning of exam room while observing appropriate contact time as indicated for disinfecting solutions.   Denice Paradise, NP

## 2019-08-09 NOTE — Telephone Encounter (Signed)
Talked to patient and let her know I do have her paperwork and it will be filled out as soon as her labs return and will be called to be picked up

## 2019-08-10 LAB — CBC WITH DIFFERENTIAL/PLATELET
Basophils Absolute: 0 10*3/uL (ref 0.0–0.2)
Basos: 0 %
EOS (ABSOLUTE): 0.1 10*3/uL (ref 0.0–0.4)
Eos: 1 %
Hematocrit: 42.3 % (ref 34.0–46.6)
Hemoglobin: 15.1 g/dL (ref 11.1–15.9)
Immature Grans (Abs): 0 10*3/uL (ref 0.0–0.1)
Immature Granulocytes: 1 %
Lymphocytes Absolute: 1.8 10*3/uL (ref 0.7–3.1)
Lymphs: 26 %
MCH: 34.2 pg — ABNORMAL HIGH (ref 26.6–33.0)
MCHC: 35.7 g/dL (ref 31.5–35.7)
MCV: 96 fL (ref 79–97)
Monocytes Absolute: 0.8 10*3/uL (ref 0.1–0.9)
Monocytes: 11 %
Neutrophils Absolute: 4.1 10*3/uL (ref 1.4–7.0)
Neutrophils: 61 %
Platelets: 172 10*3/uL (ref 150–450)
RBC: 4.41 x10E6/uL (ref 3.77–5.28)
RDW: 12 % (ref 11.7–15.4)
WBC: 6.8 10*3/uL (ref 3.4–10.8)

## 2019-08-10 LAB — COMPREHENSIVE METABOLIC PANEL
ALT: 36 IU/L — ABNORMAL HIGH (ref 0–32)
AST: 45 IU/L — ABNORMAL HIGH (ref 0–40)
Albumin/Globulin Ratio: 1.8 (ref 1.2–2.2)
Albumin: 4.4 g/dL (ref 3.8–4.8)
Alkaline Phosphatase: 66 IU/L (ref 48–121)
BUN/Creatinine Ratio: 12 (ref 9–23)
BUN: 9 mg/dL (ref 6–24)
Bilirubin Total: 0.8 mg/dL (ref 0.0–1.2)
CO2: 24 mmol/L (ref 20–29)
Calcium: 8.5 mg/dL — ABNORMAL LOW (ref 8.7–10.2)
Chloride: 103 mmol/L (ref 96–106)
Creatinine, Ser: 0.73 mg/dL (ref 0.57–1.00)
GFR calc Af Amer: 112 mL/min/{1.73_m2} (ref >59)
GFR calc non Af Amer: 97 mL/min/{1.73_m2} (ref >59)
Globulin, Total: 2.5 g/dL (ref 1.5–4.5)
Glucose: 89 mg/dL (ref 65–99)
Potassium: 3.7 mmol/L (ref 3.5–5.2)
Sodium: 139 mmol/L (ref 134–144)
Total Protein: 6.9 g/dL (ref 6.0–8.5)

## 2019-08-10 LAB — HEMOGLOBIN A1C
Est. average glucose Bld gHb Est-mCnc: 111 mg/dL
Hgb A1c MFr Bld: 5.5 % (ref 4.8–5.6)

## 2019-08-10 LAB — LIPID PANEL
Chol/HDL Ratio: 2.5 ratio (ref 0.0–4.4)
Cholesterol, Total: 94 mg/dL — ABNORMAL LOW (ref 100–199)
HDL: 38 mg/dL — ABNORMAL LOW (ref >39)
LDL Chol Calc (NIH): 37 mg/dL (ref 0–99)
Triglycerides: 98 mg/dL (ref 0–149)
VLDL Cholesterol Cal: 19 mg/dL (ref 5–40)

## 2019-08-10 LAB — HEPATITIS C ANTIBODY: Hep C Virus Ab: <0.1 s/co ratio (ref 0.0–0.9)

## 2019-08-11 DIAGNOSIS — L729 Follicular cyst of the skin and subcutaneous tissue, unspecified: Secondary | ICD-10-CM | POA: Insufficient documentation

## 2019-08-11 DIAGNOSIS — R748 Abnormal levels of other serum enzymes: Secondary | ICD-10-CM | POA: Insufficient documentation

## 2019-08-11 NOTE — Assessment & Plan Note (Signed)
Weight gain 14 lbs.  Wt Readings from Last 3 Encounters:  08/09/19 (!) 228 lb (103.4 kg)  12/08/18 221 lb 8 oz (100.5 kg)  11/12/18 214 lb (97.1 kg)   I placed referral into the [facility]. Af few dietary sheets to review today. Low glycemic index foods

## 2019-08-11 NOTE — Assessment & Plan Note (Signed)
New onset x 1. Asx.  Weight gain- may be cause. Recheck in 1 month.

## 2019-08-11 NOTE — Telephone Encounter (Signed)
Left patient message that form is filled out and signed and up front ready for pickup

## 2019-08-11 NOTE — Telephone Encounter (Signed)
Completed signature

## 2019-08-11 NOTE — Telephone Encounter (Signed)
I filled out form and placed in quick sign folder to be signed

## 2019-08-11 NOTE — Assessment & Plan Note (Addendum)
White coat syndrome: The patient presents with alarmingly elevated blood pressure which she laughs off. She says it has  been like this for many years and her non invasieve cardiac evaluation continues to return unremarkable.  She has absolutely no symptoms related to this and insists that it is just a transient number and will come down very quickly when she leaves the office.  She has not worn a 24-hour continuous home blood pressure monitoring device.   Patient advised to go home and relax.  Continue on her amlodipine 5 mg daily.  She will continue to monitor blood pressure at home.We repeated her BP and still elevated. Dash diet- for HTN.   BP Readings from Last 3 Encounters:  08/09/19 (!) 210/140  01/13/19 (!) 128/91  01/07/19 121/78

## 2019-08-11 NOTE — Assessment & Plan Note (Signed)
I placed referral to dermatology for you to get that cyst removed on your back.

## 2019-08-30 ENCOUNTER — Encounter (INDEPENDENT_AMBULATORY_CARE_PROVIDER_SITE_OTHER): Payer: Self-pay | Admitting: Bariatrics

## 2019-08-30 ENCOUNTER — Ambulatory Visit (INDEPENDENT_AMBULATORY_CARE_PROVIDER_SITE_OTHER): Payer: Managed Care, Other (non HMO) | Admitting: Bariatrics

## 2019-08-30 ENCOUNTER — Other Ambulatory Visit: Payer: Self-pay

## 2019-08-30 VITALS — BP 148/88 | HR 78 | Temp 98.8°F | Ht 66.0 in | Wt 223.0 lb

## 2019-08-30 DIAGNOSIS — R0602 Shortness of breath: Secondary | ICD-10-CM

## 2019-08-30 DIAGNOSIS — R5383 Other fatigue: Secondary | ICD-10-CM | POA: Diagnosis not present

## 2019-08-30 DIAGNOSIS — R7989 Other specified abnormal findings of blood chemistry: Secondary | ICD-10-CM

## 2019-08-30 DIAGNOSIS — E559 Vitamin D deficiency, unspecified: Secondary | ICD-10-CM | POA: Diagnosis not present

## 2019-08-30 DIAGNOSIS — I10 Essential (primary) hypertension: Secondary | ICD-10-CM

## 2019-08-30 DIAGNOSIS — Z1331 Encounter for screening for depression: Secondary | ICD-10-CM | POA: Diagnosis not present

## 2019-08-30 DIAGNOSIS — Z6836 Body mass index (BMI) 36.0-36.9, adult: Secondary | ICD-10-CM

## 2019-08-30 DIAGNOSIS — Z9189 Other specified personal risk factors, not elsewhere classified: Secondary | ICD-10-CM

## 2019-08-30 DIAGNOSIS — Z0289 Encounter for other administrative examinations: Secondary | ICD-10-CM

## 2019-08-31 ENCOUNTER — Encounter (INDEPENDENT_AMBULATORY_CARE_PROVIDER_SITE_OTHER): Payer: Self-pay | Admitting: Bariatrics

## 2019-08-31 LAB — VITAMIN D 25 HYDROXY (VIT D DEFICIENCY, FRACTURES): Vit D, 25-Hydroxy: 47.8 ng/mL (ref 30.0–100.0)

## 2019-08-31 LAB — VITAMIN B12: Vitamin B-12: 422 pg/mL (ref 232–1245)

## 2019-08-31 LAB — INSULIN, RANDOM: INSULIN: 12.7 u[IU]/mL (ref 2.6–24.9)

## 2019-08-31 NOTE — Progress Notes (Signed)
Dear Denice Paradise, NP,   Thank you for referring [name] to our clinic. The following note includes my evaluation and treatment recommendations.  Chief Complaint:   OBESITY [name] (MR# [id number]) is a 49 y.o. female who presents for evaluation and treatment of obesity and related comorbidities. Current BMI is Body mass index is 35.99 kg/m.Ruth Elliott has been struggling with her weight for many years and has been unsuccessful in either losing weight, maintaining weight loss, or reaching her healthy weight goal.  Ruth Elliott is currently in the action stage of change and ready to dedicate time achieving and maintaining a healthier weight. Ruth Elliott is interested in becoming our patient and working on intensive lifestyle modifications including (but not limited to) diet and exercise for weight loss.  Ruth Elliott likes to cook. She states that she craves meat and occasionally carbohydrates.  Ruth Elliott's habits were reviewed today and are as follows: her desired weight loss is 48 lbs, she has been heavy most of her life, her heaviest weight ever was 240 pounds, she craves steak, hamburger, occasionally chips, bread, and spicy foods, she skips dinner frequently, she has binge eating behaviors and she struggles with emotional eating.  Depression Screen Ruth Elliott's Food and Mood (modified PHQ-9) score was 1.  Depression screen [facility] 2/9 08/30/2019  Decreased Interest 0  Down, Depressed, Hopeless 0  PHQ - 2 Score 0  Altered sleeping 0  Tired, decreased energy 0  Change in appetite 1  Feeling bad or failure about yourself  0  Trouble concentrating 0  Moving slowly or fidgety/restless 0  Suicidal thoughts 0  PHQ-9 Score 1  Difficult doing work/chores Not difficult at Ruth   Subjective:   Other fatigue. Kenly denies daytime somnolence and admits to waking up still tired. Shelsea generally gets 5 hours of sleep per night, and states that she does not sleep well most nights. Snoring is not present.  Apneic episodes are not present. Epworth Sleepiness Score is 2.  SOB (shortness of breath) on exertion. Lilianne notes increasing shortness of breath with certain activities and seems to be worsening over time with weight gain. She notes getting out of breath sooner with activity than she used to. This has not gotten worse recently. Nohelia denies shortness of breath at rest or orthopnea.  Essential hypertension. Quinlan is taking amlodipine.  BP Readings from Last 3 Encounters:  08/30/19 (!) 148/88  08/09/19 (!) 210/140  01/13/19 (!) 128/91   Lab Results  Component Value Date   CREATININE 0.73 08/09/2019   CREATININE 0.63 09/28/2018   CREATININE 0.74 06/03/2018   Elevated LFTs. Ruth Elliott has a new dx of elevated ALT. Her BMI is over 40. She denies abdominal pain or jaundice and has never been told of any liver problems in the past. She denies excessive alcohol intake.  Lab Results  Component Value Date   ALT 36 (H) 08/09/2019   AST 45 (H) 08/09/2019   ALKPHOS 66 08/09/2019   BILITOT 0.8 08/09/2019   Vitamin D deficiency. Ruth Elliott is taking a multivitamin and Vitamin D.   Ref. Range 09/28/2018 11:54  Vitamin D, 25-Hydroxy Latest Ref Range: 30.0 - 100.0 ng/mL 35.3   Depression screening. Debar had a negative depression screen with a PHQ-9 score of 1.  At risk for heart disease. [name] is at a higher than average risk for cardiovascular disease due to hypertension and obesity.   Assessment/Plan:   Other fatigue. Ruth Elliott does not feel that her weight is causing her energy to  be lower than it should be. Fatigue may be related to obesity, depression or many other causes. Labs will be ordered, and in the meanwhile, Ruth Elliott will focus on self care including making healthy food choices, increasing physical activity and focusing on stress reduction. EKG 12-Lead, Insulin, random, Vitamin B12 testing ordered today.  SOB (shortness of breath) on exertion. Ruth Elliott does not feel that she gets out of breath more  easily that she used to when she exercises. Ruth Elliott's shortness of breath appears to be obesity related and exercise induced. She has agreed to work on weight loss and gradually increase exercise to treat her exercise induced shortness of breath. Will continue to monitor closely.  Essential hypertension. Ruth Elliott is working on healthy weight loss and exercise to improve blood pressure control. We will watch for signs of hypotension as she continues her lifestyle modifications. She will continue her medication as directed.   Elevated LFTs. We discussed the likely diagnosis of non-alcoholic fatty liver disease today and how this condition is obesity related. Ruth Elliott was educated the importance of weight loss. Ruth Elliott agreed to continue with her weight loss efforts with healthier diet and exercise as an essential part of her treatment plan. PCP will check LFT's 09/13/2019.  Vitamin D deficiency. Low Vitamin D level contributes to fatigue and are associated with obesity, breast, and colon cancer. VITAMIN D 25 Hydroxy (Vit-D Deficiency, Fractures) level will be checked today.  Depression screening. Depression screening was negative.  At risk for heart disease. Ruth Elliott was given approximately 15 minutes of coronary artery disease prevention counseling today. She is 49 y.o. female and has risk factors for heart disease including obesity. We discussed intensive lifestyle modifications today with an emphasis on specific weight loss instructions and strategies.   Repetitive spaced learning was employed today to elicit superior memory formation and behavioral change.  Class 2 severe obesity with serious comorbidity and body mass index (BMI) of 36.0 to 36.9 in adult, unspecified obesity type (Ruth Elliott).  Ruth Elliott is currently in the action stage of change and her goal is to continue with weight loss efforts. I recommend [name] begin the structured treatment plan as follows:  She has agreed to the Category 4 Plan.  She will work  on meal planning and minimize meal skpping.  We reviewed with the patient labs from 08/09/2019 including CMP, lipids, and CBC.  Exercise goals: Johnnye will go up and down stairs, ride horses, and exercise on Nordic Track bike for cardio.  Behavioral modification strategies: increasing lean protein intake, decreasing simple carbohydrates, increasing vegetables, increasing water intake, decreasing eating out, no skipping meals, meal planning and cooking strategies, keeping healthy foods in the home and planning for success.  She was informed of the importance of frequent follow-up visits to maximize her success with intensive lifestyle modifications for her multiple health conditions. She was informed we would discuss her lab results at her next visit unless there is a critical issue that needs to be addressed sooner. Koriana agreed to keep her next visit at the agreed upon time to discuss these results.  Objective:   Blood pressure (!) 148/88, pulse 78, temperature 98.8 F (37.1 C), height 5\' 6"  (1.676 m), weight 223 lb (101.2 kg), SpO2 99 %. Body mass index is 35.99 kg/m.  EKG: Sinus  Rhythm with a rate of 76 BPM. Left atrial enlargement. Poor R-wave progression - nonspecific. Nonspecific T-abnormality. Otherwise normal.   Indirect Calorimeter completed today shows a VO2 of 335 and a REE of 2335.  Her  calculated basal metabolic rate is 5697 thus her basal metabolic rate is better than expected.  General: Cooperative, alert, well developed, in no acute distress. HEENT: Conjunctivae and lids unremarkable. Cardiovascular: Regular rhythm.  Lungs: Normal work of breathing. Neurologic: No focal deficits.   Lab Results  Component Value Date   CREATININE 0.73 08/09/2019   BUN 9 08/09/2019   NA 139 08/09/2019   K 3.7 08/09/2019   CL 103 08/09/2019   CO2 24 08/09/2019   Lab Results  Component Value Date   ALT 36 (H) 08/09/2019   AST 45 (H) 08/09/2019   ALKPHOS 66 08/09/2019   BILITOT 0.8  08/09/2019   Lab Results  Component Value Date   HGBA1C 5.5 08/09/2019   HGBA1C 5.3 09/28/2018   HGBA1C 5.4 08/18/2017   HGBA1C 5.1 08/19/2016   HGBA1C 5.0 07/13/2015   Lab Results  Component Value Date   INSULIN 12.7 08/30/2019   Lab Results  Component Value Date   TSH 1.420 09/28/2018   Lab Results  Component Value Date   CHOL 94 (L) 08/09/2019   HDL 38 (L) 08/09/2019   LDLCALC 37 08/09/2019   TRIG 98 08/09/2019   CHOLHDL 2.5 08/09/2019   Lab Results  Component Value Date   WBC 6.8 08/09/2019   HGB 15.1 08/09/2019   HCT 42.3 08/09/2019   MCV 96 08/09/2019   PLT 172 08/09/2019   No results found for: IRON, TIBC, FERRITIN  Attestation Statements:   Reviewed by clinician on day of visit: allergies, medications, problem list, medical history, surgical history, family history, social history, and previous encounter notes.  Migdalia Dk, am acting as Location manager for [employer], DO   I have reviewed the above documentation for accuracy and completeness, and I agree with the above. Jearld Lesch, DO

## 2019-09-13 ENCOUNTER — Telehealth: Payer: Self-pay | Admitting: Nurse Practitioner

## 2019-09-13 ENCOUNTER — Ambulatory Visit (INDEPENDENT_AMBULATORY_CARE_PROVIDER_SITE_OTHER): Payer: Managed Care, Other (non HMO) | Admitting: Bariatrics

## 2019-09-13 ENCOUNTER — Encounter (INDEPENDENT_AMBULATORY_CARE_PROVIDER_SITE_OTHER): Payer: Self-pay | Admitting: Bariatrics

## 2019-09-13 ENCOUNTER — Other Ambulatory Visit: Payer: Self-pay

## 2019-09-13 ENCOUNTER — Other Ambulatory Visit (INDEPENDENT_AMBULATORY_CARE_PROVIDER_SITE_OTHER): Payer: Managed Care, Other (non HMO)

## 2019-09-13 VITALS — BP 148/86 | HR 70 | Temp 98.3°F | Ht 66.0 in | Wt 219.0 lb

## 2019-09-13 DIAGNOSIS — Z6835 Body mass index (BMI) 35.0-35.9, adult: Secondary | ICD-10-CM

## 2019-09-13 DIAGNOSIS — I10 Essential (primary) hypertension: Secondary | ICD-10-CM | POA: Diagnosis not present

## 2019-09-13 DIAGNOSIS — Z6836 Body mass index (BMI) 36.0-36.9, adult: Secondary | ICD-10-CM

## 2019-09-13 DIAGNOSIS — Z9189 Other specified personal risk factors, not elsewhere classified: Secondary | ICD-10-CM | POA: Diagnosis not present

## 2019-09-13 DIAGNOSIS — E8881 Metabolic syndrome: Secondary | ICD-10-CM | POA: Diagnosis not present

## 2019-09-13 DIAGNOSIS — R748 Abnormal levels of other serum enzymes: Secondary | ICD-10-CM

## 2019-09-13 NOTE — Telephone Encounter (Signed)
Pt dropped off wellness screening appeal form to be filled out. Please call when complete

## 2019-09-13 NOTE — Telephone Encounter (Signed)
Form placed in quick sign folder for you to fill out

## 2019-09-13 NOTE — Progress Notes (Signed)
Chief Complaint:   OBESITY Ruth Elliott is here to discuss her progress with her obesity treatment plan along with follow-up of her obesity related diagnoses. Ruth Elliott is on the Category 4 Plan and states she is following her eating plan approximately 98% of the time. Ruth Elliott states she is riding/walking horse 20-30 minutes 7 times per week.  Today's visit was #: 2 Starting weight: 223 lbs Starting date: 08/30/2019 Today's weight: 219 lbs Today's date: 09/13/2019 Total lbs lost to date: 4 Total lbs lost since last in-office visit: 4  Interim History: Ruth Elliott is down 4 lbs. She states she was not hungry.  Subjective:   Insulin resistance, mild. Ruth Elliott has a diagnosis of insulin resistance based on her elevated fasting insulin level >5. She continues to work on diet and exercise to decrease her risk of diabetes. No polyphagia.  Lab Results  Component Value Date   INSULIN 12.7 08/30/2019   Lab Results  Component Value Date   HGBA1C 5.5 08/09/2019   Essential hypertension. Ruth Elliott states that she has white coat syndrome. Blood pressure is slightly elevated today. She is taking Norvasc.  BP Readings from Last 3 Encounters:  09/13/19 (!) 148/86  08/30/19 (!) 148/88  08/09/19 (!) 210/140   Lab Results  Component Value Date   CREATININE 0.73 08/09/2019   CREATININE 0.63 09/28/2018   CREATININE 0.74 06/03/2018   At risk for diabetes mellitus. Ruth Elliott is at higher than average risk for developing diabetes due to insulin resistance and obesity.   Assessment/Plan:   Insulin resistance, mild. Ruth Elliott will continue to work on weight loss, exercise, and decreasing simple carbohydrates to help decrease the risk of diabetes. Ruth Elliott agreed to follow-up with [name] as directed to closely monitor her progress. Handout was provided on Insulin Resistance and Prediabetes.  Essential hypertension. Ruth Elliott is working on healthy weight loss and exercise to improve blood pressure control. We will watch for  signs of hypotension as she continues her lifestyle modifications. She will continue her medication as directed.   At risk for diabetes mellitus. Ruth Elliott was given approximately 15 minutes of diabetes education and counseling today. We discussed intensive lifestyle modifications today with an emphasis on weight loss as well as increasing exercise and decreasing simple carbohydrates in her diet. We also reviewed medication options with an emphasis on risk versus benefit of those discussed.   Repetitive spaced learning was employed today to elicit superior memory formation and behavioral change.  Class 2 severe obesity with serious comorbidity and body mass index (BMI) of 35.0 to 35.9 in adult, unspecified obesity type ([place]).  Ruth Elliott is currently in the action stage of change. As such, her goal is to continue with weight loss efforts. She has agreed to the Category 4 Plan.   She will work on meal planning.  We independently reviewed with the patient labs from 08/30/2019 including Vitamin D, B12, and insulin.  Exercise goals: Ruth Elliott will continue her current exercise regimen and will use My Fitness Pal.  Behavioral modification strategies: increasing lean protein intake, decreasing simple carbohydrates, increasing vegetables, increasing water intake, decreasing eating out, no skipping meals, meal planning and cooking strategies and keeping healthy foods in the home.  Ruth Elliott has agreed to follow-up with our clinic in 2 weeks. She was informed of the importance of frequent follow-up visits to maximize her success with intensive lifestyle modifications for her multiple health conditions.   Objective:   Blood pressure (!) 148/86, pulse 70, temperature 98.3 F (36.8 C), height 5'  6" (1.676 m), weight 219 lb (99.3 kg), SpO2 98 %. Body mass index is 35.35 kg/m.  General: Cooperative, alert, well developed, in no acute distress. HEENT: Conjunctivae and lids unremarkable. Cardiovascular: Regular rhythm.    Lungs: Normal work of breathing. Neurologic: No focal deficits.   Lab Results  Component Value Date   CREATININE 0.73 08/09/2019   BUN 9 08/09/2019   NA 139 08/09/2019   K 3.7 08/09/2019   CL 103 08/09/2019   CO2 24 08/09/2019   Lab Results  Component Value Date   ALT 36 (H) 08/09/2019   AST 45 (H) 08/09/2019   ALKPHOS 66 08/09/2019   BILITOT 0.8 08/09/2019   Lab Results  Component Value Date   HGBA1C 5.5 08/09/2019   HGBA1C 5.3 09/28/2018   HGBA1C 5.4 08/18/2017   HGBA1C 5.1 08/19/2016   HGBA1C 5.0 07/13/2015   Lab Results  Component Value Date   INSULIN 12.7 08/30/2019   Lab Results  Component Value Date   TSH 1.420 09/28/2018   Lab Results  Component Value Date   CHOL 94 (L) 08/09/2019   HDL 38 (L) 08/09/2019   LDLCALC 37 08/09/2019   TRIG 98 08/09/2019   CHOLHDL 2.5 08/09/2019   Lab Results  Component Value Date   WBC 6.8 08/09/2019   HGB 15.1 08/09/2019   HCT 42.3 08/09/2019   MCV 96 08/09/2019   PLT 172 08/09/2019   No results found for: IRON, TIBC, FERRITIN  Attestation Statements:   Reviewed by clinician on day of visit: allergies, medications, problem list, medical history, surgical history, family history, social history, and previous encounter notes.  Migdalia Dk, am acting as Location manager for [employer], DO   I have reviewed the above documentation for accuracy and completeness, and I agree with the above. Jearld Lesch, DO

## 2019-09-13 NOTE — Progress Notes (Signed)
Hepatic  

## 2019-09-14 LAB — HEPATIC FUNCTION PANEL
ALT: 15 IU/L (ref 0–32)
AST: 23 IU/L (ref 0–40)
Albumin: 4.4 g/dL (ref 3.8–4.8)
Alkaline Phosphatase: 64 IU/L (ref 48–121)
Bilirubin Total: 0.4 mg/dL (ref 0.0–1.2)
Bilirubin, Direct: 0.13 mg/dL (ref 0.00–0.40)
Total Protein: 7 g/dL (ref 6.0–8.5)

## 2019-09-15 NOTE — Telephone Encounter (Signed)
Called patient and let know her form is ready for pick up and is up front

## 2019-09-27 ENCOUNTER — Ambulatory Visit (INDEPENDENT_AMBULATORY_CARE_PROVIDER_SITE_OTHER): Payer: Managed Care, Other (non HMO) | Admitting: Bariatrics

## 2019-09-27 ENCOUNTER — Encounter (INDEPENDENT_AMBULATORY_CARE_PROVIDER_SITE_OTHER): Payer: Self-pay | Admitting: Bariatrics

## 2019-09-27 ENCOUNTER — Other Ambulatory Visit: Payer: Self-pay

## 2019-09-27 VITALS — BP 148/86 | HR 7 | Temp 98.0°F | Ht 66.0 in | Wt 217.0 lb

## 2019-09-27 DIAGNOSIS — R7989 Other specified abnormal findings of blood chemistry: Secondary | ICD-10-CM

## 2019-09-27 DIAGNOSIS — I10 Essential (primary) hypertension: Secondary | ICD-10-CM

## 2019-09-27 DIAGNOSIS — Z6835 Body mass index (BMI) 35.0-35.9, adult: Secondary | ICD-10-CM

## 2019-09-29 NOTE — Progress Notes (Signed)
Chief Complaint:   OBESITY Ruth Elliott is here to discuss her progress with her obesity treatment plan along with follow-up of her obesity related diagnoses. Ruth Elliott is on the Category 4 Plan and states she is following her eating plan approximately 100% of the time. [name] states she is riding horses for 30 minutes 6 days per week and walking for 20 minutes 6 times per week.  Today's visit was #: 3 Starting weight: 223 lbs Starting date: 08/30/2019 Today's weight: 217 lbs Today's date: 09/27/2019 Total lbs lost to date: 6 lbs Total lbs lost since last in-office visit: 2 lbs  Interim History: Ruth Elliott is down an additional 2 pounds and is doing well overall.  She is doing well with her water.  She is doing well with her protein.  Subjective:   1. Elevated LFTs She denies abdominal pain or jaundice and has never been told of any liver problems in the past. She denies excessive alcohol intake.  Lab Results  Component Value Date   ALT 15 09/13/2019   AST 23 09/13/2019   ALKPHOS 64 09/13/2019   BILITOT 0.4 09/13/2019   2. Essential hypertension Review: taking medications as instructed, no medication side effects noted, no chest pain on exertion, no dyspnea on exertion, no swelling of ankles.  She is taking Norvasc.  Blood pressure is controlled.  BP Readings from Last 3 Encounters:  09/27/19 (!) 148/86  09/13/19 (!) 148/86  08/30/19 (!) 148/88   Assessment/Plan:   1. Elevated LFTs We discussed the likely diagnosis of non-alcoholic fatty liver disease today and how this condition is obesity related. Ruth Elliott was educated the importance of weight loss. Ruth Elliott agreed to continue with her weight loss efforts with healthier diet and exercise as an essential part of her treatment plan.  Will continue to monitor.  2. Essential hypertension Ruth Elliott is working on healthy weight loss and exercise to improve blood pressure control. We will watch for signs of hypotension as she continues her lifestyle  modifications.  Continue medication.  3. Class 2 severe obesity with serious comorbidity and body mass index (BMI) of 35.0 to 35.9 in adult, unspecified obesity type ([name]) Ruth Elliott is currently in the action stage of change. As such, her goal is to continue with weight loss efforts. She has agreed to the Category 4 Plan.   She will work on meal planning and increasing her water intake.  Exercise goals: Riding horses and walking.  Behavioral modification strategies: increasing lean protein intake, decreasing simple carbohydrates, increasing vegetables, increasing water intake, decreasing eating out, no skipping meals, meal planning and cooking strategies, keeping healthy foods in the home and planning for success.  Ruth Elliott has agreed to follow-up with our clinic in 2 weeks. She was informed of the importance of frequent follow-up visits to maximize her success with intensive lifestyle modifications for her multiple health conditions.   Objective:   Blood pressure (!) 148/86, pulse (!) 7, temperature 98 F (36.7 C), height 5\' 6"  (1.676 m), weight 217 lb (98.4 kg), SpO2 99 %. Body mass index is 35.02 kg/m.  General: Cooperative, alert, well developed, in no acute distress. HEENT: Conjunctivae and lids unremarkable. Cardiovascular: Regular rhythm.  Lungs: Normal work of breathing. Neurologic: No focal deficits.   Lab Results  Component Value Date   CREATININE 0.73 08/09/2019   BUN 9 08/09/2019   NA 139 08/09/2019   K 3.7 08/09/2019   CL 103 08/09/2019   CO2 24 08/09/2019   Lab Results  Component Value  Date   ALT 15 09/13/2019   AST 23 09/13/2019   ALKPHOS 64 09/13/2019   BILITOT 0.4 09/13/2019   Lab Results  Component Value Date   HGBA1C 5.5 08/09/2019   HGBA1C 5.3 09/28/2018   HGBA1C 5.4 08/18/2017   HGBA1C 5.1 08/19/2016   HGBA1C 5.0 07/13/2015   Lab Results  Component Value Date   INSULIN 12.7 08/30/2019   Lab Results  Component Value Date   TSH 1.420 09/28/2018    Lab Results  Component Value Date   CHOL 94 (L) 08/09/2019   HDL 38 (L) 08/09/2019   LDLCALC 37 08/09/2019   TRIG 98 08/09/2019   CHOLHDL 2.5 08/09/2019   Lab Results  Component Value Date   WBC 6.8 08/09/2019   HGB 15.1 08/09/2019   HCT 42.3 08/09/2019   MCV 96 08/09/2019   PLT 172 08/09/2019   Attestation Statements:   Reviewed by clinician on day of visit: allergies, medications, problem list, medical history, surgical history, family history, social history, and previous encounter notes.  Time spent on visit including pre-visit chart review and post-visit care and charting was 20 minutes.   I, Water quality scientist, CMA, am acting as Location manager for [employer], DO  I have reviewed the above documentation for accuracy and completeness, and I agree with the above. Jearld Lesch, DO

## 2019-09-30 ENCOUNTER — Encounter (INDEPENDENT_AMBULATORY_CARE_PROVIDER_SITE_OTHER): Payer: Self-pay | Admitting: Bariatrics

## 2019-10-11 ENCOUNTER — Encounter (INDEPENDENT_AMBULATORY_CARE_PROVIDER_SITE_OTHER): Payer: Self-pay | Admitting: Bariatrics

## 2019-10-11 ENCOUNTER — Other Ambulatory Visit: Payer: Self-pay

## 2019-10-11 ENCOUNTER — Ambulatory Visit (INDEPENDENT_AMBULATORY_CARE_PROVIDER_SITE_OTHER): Payer: Managed Care, Other (non HMO) | Admitting: Bariatrics

## 2019-10-11 VITALS — BP 140/82 | HR 80 | Temp 99.0°F | Ht 66.0 in | Wt 217.0 lb

## 2019-10-11 DIAGNOSIS — Z6835 Body mass index (BMI) 35.0-35.9, adult: Secondary | ICD-10-CM

## 2019-10-11 DIAGNOSIS — I10 Essential (primary) hypertension: Secondary | ICD-10-CM | POA: Diagnosis not present

## 2019-10-11 DIAGNOSIS — E8881 Metabolic syndrome: Secondary | ICD-10-CM

## 2019-10-12 NOTE — Progress Notes (Signed)
Chief Complaint:   OBESITY Ruth Elliott is here to discuss her progress with her obesity treatment plan along with follow-up of her obesity related diagnoses. Ruth Elliott is on the Category 4 Plan and states she is following her eating plan approximately 40% of the time. Ruth Elliott states she is exercising for 0 minutes 0 times per week.  Today's visit was #: 4 Starting weight: 223 lbs Starting date: 08/30/2019 Today's weight: 217 lbs Today's date: 10/11/2019 Total lbs lost to date: 6 lbs Total lbs lost since last in-office visit: 0  Interim History: Ruth Elliott's weight remains the same since her last visit.  She has been working 12-hour days.  She is doing well with water.  Subjective:   1. Essential hypertension Review: taking medications as instructed, no medication side effects noted, no chest pain on exertion, no dyspnea on exertion, no swelling of ankles.  She is taking Norvasc.  Blood pressure is reasonably well controlled.  BP Readings from Last 3 Encounters:  10/11/19 140/82  09/27/19 (!) 148/86  09/13/19 (!) 148/86   2. Insulin resistance Ruth Elliott has a diagnosis of insulin resistance based on her elevated fasting insulin level >5. She continues to work on diet and exercise to decrease her risk of diabetes.  She is not on medications for this.  Lab Results  Component Value Date   INSULIN 12.7 08/30/2019   Lab Results  Component Value Date   HGBA1C 5.5 08/09/2019   Assessment/Plan:   1. Essential hypertension Ruth Elliott is working on healthy weight loss and exercise to improve blood pressure control. We will watch for signs of hypotension as she continues her lifestyle modifications.  Continue medication.  2. Insulin resistance Ruth Elliott will continue to work on weight loss, exercise, and decreasing simple carbohydrates to help decrease the risk of diabetes. Ruth Elliott agreed to follow-up with [name] as directed to closely monitor her progress.  Decrease carbohydrates, increase healthy protein and  fats.  3. Class 2 severe obesity with serious comorbidity and body mass index (BMI) of 35.0 to 35.9 in adult, unspecified obesity type (Ruth Elliott)  Ruth Elliott is currently in the action stage of change. As such, her goal is to continue with weight loss efforts. She has agreed to the Category 4 Plan.   She will work on meal planning and intentional eating.  She will pack her lunch for work and increase protein intake.  Exercise goals: Uses Fit Bit and gets 12,000 steps at work.  Behavioral modification strategies: increasing lean protein intake, decreasing simple carbohydrates, increasing vegetables, increasing water intake, decreasing eating out, no skipping meals, meal planning and cooking strategies, keeping healthy foods in the home and planning for success.  Ruth Elliott has agreed to follow-up with our clinic in 2-3 weeks. She was informed of the importance of frequent follow-up visits to maximize her success with intensive lifestyle modifications for her multiple health conditions.   Objective:   Blood pressure 140/82, pulse 80, temperature 99 F (37.2 C), height 5\' 6"  (1.676 m), weight 217 lb (98.4 kg), SpO2 97 %. Body mass index is 35.02 kg/m.  General: Cooperative, alert, well developed, in no acute distress. HEENT: Conjunctivae and lids unremarkable. Cardiovascular: Regular rhythm.  Lungs: Normal work of breathing. Neurologic: No focal deficits.   Lab Results  Component Value Date   CREATININE 0.73 08/09/2019   BUN 9 08/09/2019   NA 139 08/09/2019   K 3.7 08/09/2019   CL 103 08/09/2019   CO2 24 08/09/2019   Lab Results  Component Value Date  ALT 15 09/13/2019   AST 23 09/13/2019   ALKPHOS 64 09/13/2019   BILITOT 0.4 09/13/2019   Lab Results  Component Value Date   HGBA1C 5.5 08/09/2019   HGBA1C 5.3 09/28/2018   HGBA1C 5.4 08/18/2017   HGBA1C 5.1 08/19/2016   HGBA1C 5.0 07/13/2015   Lab Results  Component Value Date   INSULIN 12.7 08/30/2019   Lab Results  Component  Value Date   TSH 1.420 09/28/2018   Lab Results  Component Value Date   CHOL 94 (L) 08/09/2019   HDL 38 (L) 08/09/2019   LDLCALC 37 08/09/2019   TRIG 98 08/09/2019   CHOLHDL 2.5 08/09/2019   Lab Results  Component Value Date   WBC 6.8 08/09/2019   HGB 15.1 08/09/2019   HCT 42.3 08/09/2019   MCV 96 08/09/2019   PLT 172 08/09/2019   Attestation Statements:   Reviewed by clinician on day of visit: allergies, medications, problem list, medical history, surgical history, family history, social history, and previous encounter notes.  Time spent on visit including pre-visit chart review and post-visit care and charting was 20 minutes.   I, Water quality scientist, CMA, am acting as Location manager for [employer], DO  I have reviewed the above documentation for accuracy and completeness, and I agree with the above. Jearld Lesch, DO

## 2019-10-13 ENCOUNTER — Encounter (INDEPENDENT_AMBULATORY_CARE_PROVIDER_SITE_OTHER): Payer: Self-pay | Admitting: Bariatrics

## 2019-10-25 ENCOUNTER — Other Ambulatory Visit: Payer: Self-pay

## 2019-10-25 ENCOUNTER — Ambulatory Visit (INDEPENDENT_AMBULATORY_CARE_PROVIDER_SITE_OTHER): Payer: Managed Care, Other (non HMO) | Admitting: Bariatrics

## 2019-10-25 ENCOUNTER — Encounter (INDEPENDENT_AMBULATORY_CARE_PROVIDER_SITE_OTHER): Payer: Self-pay | Admitting: Bariatrics

## 2019-10-25 VITALS — BP 145/85 | HR 76 | Temp 98.8°F | Ht 66.0 in | Wt 217.0 lb

## 2019-10-25 DIAGNOSIS — I10 Essential (primary) hypertension: Secondary | ICD-10-CM

## 2019-10-25 DIAGNOSIS — Z6835 Body mass index (BMI) 35.0-35.9, adult: Secondary | ICD-10-CM | POA: Diagnosis not present

## 2019-10-25 DIAGNOSIS — E8881 Metabolic syndrome: Secondary | ICD-10-CM

## 2019-10-26 ENCOUNTER — Encounter (INDEPENDENT_AMBULATORY_CARE_PROVIDER_SITE_OTHER): Payer: Self-pay | Admitting: Bariatrics

## 2019-10-26 NOTE — Progress Notes (Signed)
Chief Complaint:   OBESITY Yuriko L Underberg is here to discuss her progress with her obesity treatment plan along with follow-up of her obesity related diagnoses. Daneisha is on the Category 4 Plan and states she is following her eating plan approximately 80% of the time. Debra states she is horseback riding 30-45 minutes 3-4 times per week.  Today's visit was #: 5 Starting weight: 223 lbs Starting date: 08/30/2019 Today's weight: 217 lbs Today's date: 10/25/2019 Total lbs lost to date: 6 Total lbs lost since last in-office visit: 0  Interim History: Karisa's weight remains the same. She states she has not been getting in all of her protein.  Subjective:   Essential hypertension. Aneesah is taking Norvasc.  BP Readings from Last 3 Encounters:  10/25/19 (!) 145/85  10/11/19 140/82  09/27/19 (!) 148/86   Lab Results  Component Value Date   CREATININE 0.73 08/09/2019   CREATININE 0.63 09/28/2018   CREATININE 0.74 06/03/2018   Insulin resistance. Kerrigan has a diagnosis of insulin resistance based on her elevated fasting insulin level >5. She continues to work on diet and exercise to decrease her risk of diabetes. Inara is on no medication.  Lab Results  Component Value Date   INSULIN 12.7 08/30/2019   Lab Results  Component Value Date   HGBA1C 5.5 08/09/2019   Assessment/Plan:   Essential hypertension. Pieper is working on healthy weight loss and exercise to improve blood pressure control. We will watch for signs of hypotension as she continues her lifestyle modifications. She will continue her medication as directed.   Insulin resistance. Janeese will continue to work on weight loss, exercise, increasing healthy fats and protein, and decreasing simple carbohydrates to help decrease the risk of diabetes. Lanique agreed to follow-up with [name] as directed to closely monitor her progress.  Class 2 severe obesity with serious comorbidity and body mass index (BMI) of 35.0 to 35.9 in  adult, unspecified obesity type ([place]).  Shakirah is currently in the action stage of change. As such, her goal is to continue with weight loss efforts. She has agreed to change from the Category 4 Plan to the Category 3 Plan.   She will work on meal planning, intentional eating, decreasing calories, and increasing protein.  Exercise goals: Alyric will continue exercising on the [employer].  Behavioral modification strategies: increasing lean protein intake, decreasing simple carbohydrates, increasing vegetables, increasing water intake, decreasing eating out, no skipping meals, meal planning and cooking strategies, keeping healthy foods in the home and planning for success.  Saaya has agreed to follow-up with our clinic in 3 weeks. She was informed of the importance of frequent follow-up visits to maximize her success with intensive lifestyle modifications for her multiple health conditions.   Objective:   Blood pressure (!) 145/85, pulse 76, temperature 98.8 F (37.1 C), height 5\' 6"  (1.676 m), weight 217 lb (98.4 kg), SpO2 99 %. Body mass index is 35.02 kg/m.  General: Cooperative, alert, well developed, in no acute distress. HEENT: Conjunctivae and lids unremarkable. Cardiovascular: Regular rhythm.  Lungs: Normal work of breathing. Neurologic: No focal deficits.   Lab Results  Component Value Date   CREATININE 0.73 08/09/2019   BUN 9 08/09/2019   NA 139 08/09/2019   K 3.7 08/09/2019   CL 103 08/09/2019   CO2 24 08/09/2019   Lab Results  Component Value Date   ALT 15 09/13/2019   AST 23 09/13/2019   ALKPHOS 64 09/13/2019   BILITOT 0.4 09/13/2019  Lab Results  Component Value Date   HGBA1C 5.5 08/09/2019   HGBA1C 5.3 09/28/2018   HGBA1C 5.4 08/18/2017   HGBA1C 5.1 08/19/2016   HGBA1C 5.0 07/13/2015   Lab Results  Component Value Date   INSULIN 12.7 08/30/2019   Lab Results  Component Value Date   TSH 1.420 09/28/2018   Lab Results  Component Value Date   CHOL  94 (L) 08/09/2019   HDL 38 (L) 08/09/2019   LDLCALC 37 08/09/2019   TRIG 98 08/09/2019   CHOLHDL 2.5 08/09/2019   Lab Results  Component Value Date   WBC 6.8 08/09/2019   HGB 15.1 08/09/2019   HCT 42.3 08/09/2019   MCV 96 08/09/2019   PLT 172 08/09/2019   No results found for: IRON, TIBC, FERRITIN  Attestation Statements:   Reviewed by clinician on day of visit: allergies, medications, problem list, medical history, surgical history, family history, social history, and previous encounter notes.  Time spent on visit including pre-visit chart review and post-visit charting and care was 20 minutes.   Migdalia Dk, am acting as Location manager for [employer], DO   I have reviewed the above documentation for accuracy and completeness, and I agree with the above. Jearld Lesch, DO

## 2019-11-15 ENCOUNTER — Encounter (INDEPENDENT_AMBULATORY_CARE_PROVIDER_SITE_OTHER): Payer: Self-pay | Admitting: Bariatrics

## 2019-11-15 ENCOUNTER — Ambulatory Visit: Payer: Managed Care, Other (non HMO) | Admitting: Nurse Practitioner

## 2019-11-15 ENCOUNTER — Encounter: Payer: Self-pay | Admitting: Nurse Practitioner

## 2019-11-15 ENCOUNTER — Other Ambulatory Visit: Payer: Self-pay

## 2019-11-15 ENCOUNTER — Ambulatory Visit (INDEPENDENT_AMBULATORY_CARE_PROVIDER_SITE_OTHER): Payer: Managed Care, Other (non HMO) | Admitting: Bariatrics

## 2019-11-15 VITALS — BP 150/102 | HR 78 | Temp 99.0°F | Ht 66.0 in | Wt 226.0 lb

## 2019-11-15 VITALS — BP 157/102 | HR 80 | Temp 98.9°F | Ht 66.0 in | Wt 219.0 lb

## 2019-11-15 DIAGNOSIS — I10 Essential (primary) hypertension: Secondary | ICD-10-CM

## 2019-11-15 DIAGNOSIS — Z23 Encounter for immunization: Secondary | ICD-10-CM | POA: Diagnosis not present

## 2019-11-15 DIAGNOSIS — Z0000 Encounter for general adult medical examination without abnormal findings: Secondary | ICD-10-CM | POA: Insufficient documentation

## 2019-11-15 DIAGNOSIS — R7989 Other specified abnormal findings of blood chemistry: Secondary | ICD-10-CM

## 2019-11-15 DIAGNOSIS — Z1211 Encounter for screening for malignant neoplasm of colon: Secondary | ICD-10-CM | POA: Insufficient documentation

## 2019-11-15 DIAGNOSIS — Z6835 Body mass index (BMI) 35.0-35.9, adult: Secondary | ICD-10-CM | POA: Diagnosis not present

## 2019-11-15 DIAGNOSIS — Z6836 Body mass index (BMI) 36.0-36.9, adult: Secondary | ICD-10-CM | POA: Diagnosis not present

## 2019-11-15 NOTE — Progress Notes (Signed)
Established Patient Office Visit  Subjective:  Patient ID: Ruth Elliott, female    DOB: [birth year]  Age: 49 y.o. MRN: [id number]  CC:  Chief Complaint  Patient presents with  . Follow-up    hypertension    HPI Ruth Elliott presents for a 35-month follow-up.  BMI 35/obesity: She is followed by weight loss management team and is doing well.  She is enjoying the dietary plan.  She says she may be eating more than she needs as she is picking up weight.  Wt Readings from Last 3 Encounters:  11/15/19 219 lb (99.3 kg)  11/15/19 226 lb (102.5 kg)  10/25/19 217 lb (98.4 kg)   Back cyst: She sees dermatology November 10.  No problems.  Hypertension: Maintained on amlodipine 5 mg daily followed by Dr. Rockey Situ.  She has severe white coat syndrome, blood pressure is always extremely elevated in the office is.  When she can relax, blood pressure gets down to the 140s over 90s. Hydralazine treats her blood pressure even higher.   Vitamin D deficiency: Taking vitamin D 1000 IU daily.  Immunizations: Flu vaccine done.  Covid vaccine done.  Tdap up-to-date Tdap today.  She is eligible for shingles vaccine now. Diet: See above Exercise: Walking some. Colonoscopy: Not done.  Family history negative for colon cancer.  She is agreeable to setting up colonoscopy Dexa: not done Pap Smear: Done June 2021 GYN reports normal.  She continues to take oral contraceptive pills.  She reports GYN will keep her on until she is 57 and then test for menopause. Mammogram: Done 2021 GYN reports normal   Past Medical History:  Diagnosis Date  . Allergic rhinitis   . Allergy   . Back pain   . Endometriosis    Followed by [facility]- on Nortrel  . Joint pain   . Obesity   . Thyroid disease   . White coat syndrome with hypertension     Past Surgical History:  Procedure Laterality Date  . APPENDECTOMY    . NASAL SINUS SURGERY    . OVARIAN CYST SURGERY    . THYROIDECTOMY Left 11/17/2018    Procedure: THYROIDECTOMY-Hemi Thyroidectomy;  Surgeon: Beverly Gust, MD;  Location: [facility] ORS;  Service: ENT;  Laterality: Left;  . TONSILLECTOMY  1994    Family History  Problem Relation Age of Onset  . COPD Mother   . Hypertension Mother   . Diabetes Mother   . Heart disease Mother   . Alzheimer's disease Father   . Hypertension Father     Social History   Socioeconomic History  . Marital status: Single    Spouse name: Not on file  . Number of children: Not on file  . Years of education: Not on file  . Highest education level: Not on file  Occupational History  . Occupation: Librarian, academic  Tobacco Use  . Smoking status: Never Smoker  . Smokeless tobacco: Never Used  Vaping Use  . Vaping Use: Never used  Substance and Sexual Activity  . Alcohol use: Yes    Alcohol/week: 0.0 standard drinks    Comment: 1-2 per month beer  . Drug use: No  . Sexual activity: Not Currently  Other Topics Concern  . 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  Recently moved here from [place].   G0   Single , lives alone, 2 dogs and 2 horses    Social Determinants of Health  Financial Resource Strain:   . Difficulty of Paying Living Expenses: Not on file  Food Insecurity:   . Worried About Charity fundraiser in the Last Year: Not on file  . Ran Out of Food in the Last Year: Not on file  Transportation Needs:   . Lack of Transportation (Medical): Not on file  . Lack of Transportation (Non-Medical): Not on file  Physical Activity:   . Days of Exercise per Week: Not on file  . Minutes of Exercise per Session: Not on file  Stress:   . Feeling of Stress : Not on file  Social Connections:   . Frequency of Communication with Friends and Family: Not on file  . Frequency of Social Gatherings with Friends and Family: Not on file  . Attends Religious Services: Not on file  . Active Member of Clubs or Organizations: Not on file    . Attends Archivist Meetings: Not on file  . Marital Status: Not on file  Intimate Partner Violence:   . Fear of Current or Ex-Partner: Not on file  . Emotionally Abused: Not on file  . Physically Abused: Not on file  . Sexually Abused: Not on file    Outpatient Medications Prior to Visit  Medication Sig Dispense Refill  . amLODipine (NORVASC) 5 MG tablet Take 1 tablet (5 mg total) by mouth daily. 90 tablet 4  . cholecalciferol (VITAMIN D3) 25 MCG (1000 UNIT) tablet Take 1,000 Units by mouth daily.    . COLLAGEN PO Take 1 Scoop by mouth daily. Collagen Peptide in coffee in am     . fexofenadine (ALLEGRA) 180 MG tablet Take 180 mg by mouth daily.    . Flax Oil-Fish Oil-Borage Oil (FISH OIL-FLAX OIL-BORAGE OIL) CAPS Take 2 capsules by mouth daily.     . fluticasone (FLONASE) 50 MCG/ACT nasal spray SHAKE LIQUID AND USE 2 SPRAYS IN EACH NOSTRIL DAILY 16 g 1  . ibuprofen (ADVIL) 400 MG tablet Take 400 mg by mouth every 6 (six) hours as needed.    . Multiple Vitamin (MULTIVITAMIN) capsule Take 1 capsule by mouth daily.    . norethindrone-ethinyl estradiol (MICROGESTIN,JUNEL,LOESTRIN) 1-20 MG-MCG tablet Take 1 tablet by mouth daily.  0  . guaifenesin (HUMIBID E) 400 MG TABS tablet Take 400 mg by mouth every 4 (four) hours. (Patient not taking: Reported on 11/15/2019)    . levocetirizine (XYZAL) 5 MG tablet Take 1 tablet (5 mg total) by mouth every evening. (Patient not taking: Reported on 11/15/2019) 30 tablet 3   No facility-administered medications prior to visit.    Allergies  Allergen Reactions  . Penicillins Shortness Of Breath    Did it involve swelling of the face/tongue/throat, SOB, or low BP? Yes Did it involve sudden or severe rash/hives, skin peeling, or any reaction on the inside of your mouth or nose? No Did you need to seek medical attention at a hospital or doctor's office? Yes When did it last happen?childhood If all above answers are "NO", may proceed with  cephalosporin use.   [name] Hydralazine     Elevated blood pressure, migraine, and lethary  . Lisinopril     paresthesia  . Singulair [Montelukast Sodium]     Moody    Review of Systems  Constitutional: Negative.   HENT: Negative.   Eyes: Negative.   Respiratory: Negative.   Cardiovascular: Negative.   Gastrointestinal: Negative.   Endocrine: Negative.   Genitourinary: Negative.   Musculoskeletal: Negative.   Allergic/Immunologic: Positive for  environmental allergies.  Neurological: Negative for dizziness and headaches.  Hematological: Negative for adenopathy. Bruises/bleeds easily.  Psychiatric/Behavioral:       No concerns with depression/anxiety      Objective:    Physical Exam Vitals (severe white coat syndrome ) reviewed.  Constitutional:      Appearance: She is obese.  HENT:     Head: Normocephalic and atraumatic.  Eyes:     Conjunctiva/sclera: Conjunctivae normal.     Pupils: Pupils are equal, round, and reactive to light.  Cardiovascular:     Rate and Rhythm: Normal rate and regular rhythm.     Pulses: Normal pulses.     Heart sounds: Normal heart sounds.  Pulmonary:     Effort: Pulmonary effort is normal.     Breath sounds: Normal breath sounds.  Abdominal:     Palpations: Abdomen is soft.     Tenderness: There is no abdominal tenderness.  Musculoskeletal:        General: Normal range of motion.     Cervical back: Normal range of motion.  Skin:    General: Skin is warm and dry.  Neurological:     Mental Status: She is alert and oriented to person, place, and time.  Psychiatric:        Mood and Affect: Mood normal.        Behavior: Behavior normal.     BP (!) 150/102 (BP Location: Left Arm, Patient Position: Sitting, Cuff Size: Normal)   Pulse 78   Temp 99 F (37.2 C) (Oral)   Ht 5\' 6"  (1.676 m)   Wt 226 lb (102.5 kg)   SpO2 97%   BMI 36.48 kg/m  Wt Readings from Last 3 Encounters:  11/15/19 219 lb (99.3 kg)  11/15/19 226 lb (102.5 kg)    10/25/19 217 lb (98.4 kg)   Pulse Readings from Last 3 Encounters:  11/15/19 80  11/15/19 78  10/25/19 76    BP Readings from Last 3 Encounters:  11/15/19 (!) 157/102  11/15/19 (!) 150/102  10/25/19 (!) 145/85    Lab Results  Component Value Date   CHOL 94 (L) 08/09/2019   HDL 38 (L) 08/09/2019   LDLCALC 37 08/09/2019   TRIG 98 08/09/2019   CHOLHDL 2.5 08/09/2019      Health Maintenance Due  Topic Date Due  . COLONOSCOPY  Never done    There are no preventive care reminders to display for this patient.  Lab Results  Component Value Date   TSH 1.420 09/28/2018   Lab Results  Component Value Date   WBC 6.8 08/09/2019   HGB 15.1 08/09/2019   HCT 42.3 08/09/2019   MCV 96 08/09/2019   PLT 172 08/09/2019   Lab Results  Component Value Date   NA 139 08/09/2019   K 3.7 08/09/2019   CO2 24 08/09/2019   GLUCOSE 89 08/09/2019   BUN 9 08/09/2019   CREATININE 0.73 08/09/2019   BILITOT 0.4 09/13/2019   ALKPHOS 64 09/13/2019   AST 23 09/13/2019   ALT 15 09/13/2019   PROT 7.0 09/13/2019   ALBUMIN 4.4 09/13/2019   CALCIUM 8.5 (L) 08/09/2019   Lab Results  Component Value Date   CHOL 94 (L) 08/09/2019   Lab Results  Component Value Date   HDL 38 (L) 08/09/2019   Lab Results  Component Value Date   LDLCALC 37 08/09/2019   Lab Results  Component Value Date   TRIG 98 08/09/2019   Lab Results  Component  Value Date   CHOLHDL 2.5 08/09/2019   Lab Results  Component Value Date   HGBA1C 5.5 08/09/2019      Assessment & Plan:   Problem List Items Addressed This Visit      Other   Adult BMI 36.0-36.9 kg/sq m   Preventative health care   Screening for malignant neoplasm of colon - Primary   Relevant Orders   Ambulatory referral to Gastroenterology    Other Visit Diagnoses    Need for Tdap vaccination       Relevant Orders   Tdap vaccine greater than or equal to 7yo IM (Completed)     Continue with weight loss program as you are doing a very  good job.  Your laboratory studies are up-to-date at this time.  It looks like you are due to follow-up with Dr. Rockey Situ in November  I placed referral in for colon cancer screening colonoscopy.  We have reviewed your immunizations and you are due for a Tdap today.  Continue with healthy preventative care with dental visits every 6 months, eye exam every 1 year.  If your eye continues to bother you or you may have scratched it, please see your eye doctor.  Continue to follow with your gynecologist as your Pap test and mammogram are up-to-date.  Continue to follow-up with your endocrinologist as your TSH level in June was normal.  Follow-up: Return in about 6 months (around 05/14/2020).  This visit occurred during the SARS-CoV-2 public health emergency.  Safety protocols were in place, including screening questions prior to the visit, additional usage of staff PPE, and extensive cleaning of exam room while observing appropriate contact time as indicated for disinfecting solutions.    Denice Paradise, NP

## 2019-11-15 NOTE — Patient Instructions (Addendum)
Continue with weight loss program as you are doing a very good job.  Your laboratory studies are up-to-date at this time.  It looks like you are due to follow-up with Dr. Rockey Situ in November  I placed referral in for colon cancer screening colonoscopy.  We have reviewed your immunizations and you are due for a Tdap today.  Continue with healthy preventative care with dental visits every 6 months, eye exam every 1 year.  If your eye continues to bother you or you may have scratched it, please see your eye doctor.  Continue to follow with your gynecologist as your Pap test and mammogram are up-to-date.  Continue to follow-up with your endocrinologist as your TSH level in June was normal.  Follow-up with primary care as needed or in 6 months. Preventive Care 55-66 Years Old, Female Preventive care refers to visits with your health care provider and lifestyle choices that can promote health and wellness. This includes:  A yearly physical exam. This may also be called an annual well check.  Regular dental visits and eye exams.  Immunizations.  Screening for certain conditions.  Healthy lifestyle choices, such as eating a healthy diet, getting regular exercise, not using drugs or products that contain nicotine and tobacco, and limiting alcohol use. What can I expect for my preventive care visit? Physical exam Your health care provider will check your:  Height and weight. This may be used to calculate body mass index (BMI), which tells if you are at a healthy weight.  Heart rate and blood pressure.  Skin for abnormal spots. Counseling Your health care provider may ask you questions about your:  Alcohol, tobacco, and drug use.  Emotional well-being.  Home and relationship well-being.  Sexual activity.  Eating habits.  Work and work Statistician.  Method of birth control.  Menstrual cycle.  Pregnancy history. What immunizations do I need?  Influenza (flu)  vaccine  This is recommended every year. Tetanus, diphtheria, and pertussis (Tdap) vaccine  You may need a Td booster every 10 years. Varicella (chickenpox) vaccine  You may need this if you have not been vaccinated. Zoster (shingles) vaccine  You may need this after age 72. Measles, mumps, and rubella (MMR) vaccine  You may need at least one dose of MMR if you were born in 1957 or later. You may also need a second dose. Pneumococcal conjugate (PCV13) vaccine  You may need this if you have certain conditions and were not previously vaccinated. Pneumococcal polysaccharide (PPSV23) vaccine  You may need one or two doses if you smoke cigarettes or if you have certain conditions. Meningococcal conjugate (MenACWY) vaccine  You may need this if you have certain conditions. Hepatitis A vaccine  You may need this if you have certain conditions or if you travel or work in places where you may be exposed to hepatitis A. Hepatitis B vaccine  You may need this if you have certain conditions or if you travel or work in places where you may be exposed to hepatitis B. Haemophilus influenzae type b (Hib) vaccine  You may need this if you have certain conditions. Human papillomavirus (HPV) vaccine  If recommended by your health care provider, you may need three doses over 6 months. You may receive vaccines as individual doses or as more than one vaccine together in one shot (combination vaccines). Talk with your health care provider about the risks and benefits of combination vaccines. What tests do I need? Blood tests  Lipid and cholesterol levels.  These may be checked every 5 years, or more frequently if you are over 35 years old.  Hepatitis C test.  Hepatitis B test. Screening  Lung cancer screening. You may have this screening every year starting at age 26 if you have a 30-pack-year history of smoking and currently smoke or have quit within the past 15 years.  Colorectal cancer  screening. All adults should have this screening starting at age 58 and continuing until age 67. Your health care provider may recommend screening at age 36 if you are at increased risk. You will have tests every 1-10 years, depending on your results and the type of screening test.  Diabetes screening. This is done by checking your blood sugar (glucose) after you have not eaten for a while (fasting). You may have this done every 1-3 years.  Mammogram. This may be done every 1-2 years. Talk with your health care provider about when you should start having regular mammograms. This may depend on whether you have a family history of breast cancer.  BRCA-related cancer screening. This may be done if you have a family history of breast, ovarian, tubal, or peritoneal cancers.  Pelvic exam and Pap test. This may be done every 3 years starting at age 82. Starting at age 37, this may be done every 5 years if you have a Pap test in combination with an HPV test. Other tests  Sexually transmitted disease (STD) testing.  Bone density scan. This is done to screen for osteoporosis. You may have this scan if you are at high risk for osteoporosis. Follow these instructions at home: Eating and drinking  Eat a diet that includes fresh fruits and vegetables, whole grains, lean protein, and low-fat dairy.  Take vitamin and mineral supplements as recommended by your health care provider.  Do not drink alcohol if: ? Your health care provider tells you not to drink. ? You are pregnant, may be pregnant, or are planning to become pregnant.  If you drink alcohol: ? Limit how much you have to 0-1 drink a day. ? Be aware of how much alcohol is in your drink. In the [place] one drink equals one 12 oz bottle of beer (355 mL), one 5 oz glass of wine (148 mL), or one 1 oz glass of hard liquor (44 mL). Lifestyle  Take daily care of your teeth and gums.  Stay active. Exercise for at least 30 minutes on 5 or more days  each week.  Do not use any products that contain nicotine or tobacco, such as cigarettes, e-cigarettes, and chewing tobacco. If you need help quitting, ask your health care provider.  If you are sexually active, practice safe sex. Use a condom or other form of birth control (contraception) in order to prevent pregnancy and STIs (sexually transmitted infections).  If told by your health care provider, take low-dose aspirin daily starting at age 74. What's next?  Visit your health care provider once a year for a well check visit.  Ask your health care provider how often you should have your eyes and teeth checked.  Stay up to date on all vaccines. This information is not intended to replace advice given to you by your health care provider. Make sure you discuss any questions you have with your health care provider. Document Revised: 09/11/2017 Document Reviewed: 09/11/2017 Elsevier Patient Education  2020 [employer].

## 2019-11-16 ENCOUNTER — Encounter (INDEPENDENT_AMBULATORY_CARE_PROVIDER_SITE_OTHER): Payer: Self-pay | Admitting: Bariatrics

## 2019-11-16 ENCOUNTER — Encounter: Payer: Self-pay | Admitting: Nurse Practitioner

## 2019-11-16 NOTE — Progress Notes (Addendum)
Chief Complaint:   OBESITY Ruth Elliott is here to discuss her progress with her obesity treatment plan along with follow-up of her obesity related diagnoses. Ruth Elliott is on the Category 4 Plan and states she is following her eating plan approximately 66% of the time. Ruth Elliott states she is walking 10,000+ steps 5 times per week.  Today's visit was #: 6 Starting weight: 223 lbs Starting date: 08/30/2019 Today's weight: 219 lbs Today's date: 11/15/2019 Total lbs lost to date: 4 Total lbs lost since last in-office visit: 0  Interim History: Ruth Elliott is up 2 lbs and has been out-of-town for about 1.5 weeks. She reports doing well with her water intake, but not with protein.  Subjective:   Essential hypertension. Ruth Elliott is taking Norvasc. She had been on amlodipine for 10 days, but blood pressure too high. She does report increased caffeine. She took her blood pressure medication and saw her PCP today. Blood pressure was elevated, but no changes were made to her blood pressure medications per the PCP office. We reviewed blood pressures with high levels in the past, but not consistently higher.  BP Readings from Last 3 Encounters:  11/15/19 (!) 157/102  11/15/19 (!) 150/102  10/25/19 (!) 145/85   Lab Results  Component Value Date   CREATININE 0.73 08/09/2019   CREATININE 0.63 09/28/2018   CREATININE 0.74 06/03/2018   Elevated LFTs. Ruth Elliott has a new dx of elevated ALT. Her BMI is over 40. She denies abdominal pain or jaundice and has never been told of any liver problems in the past. She denies excessive alcohol intake.   Lab Results  Component Value Date   ALT 15 09/13/2019   AST 23 09/13/2019   ALKPHOS 64 09/13/2019   BILITOT 0.4 09/13/2019   Assessment/Plan:   Essential hypertension. Ruth Elliott is working on healthy weight loss and exercise to improve blood pressure control. We will watch for signs of hypotension as she continues her lifestyle modifications. She will continue her  medication as directed. She was instructed to ask her PCP to do ambulatory blood pressure readings and to report blood pressures greater than 140/85 to the PCP.  Elevated LFTs. We discussed the likely diagnosis of non-alcoholic fatty liver disease today and how this condition is obesity related. Ruth Elliott was educated the importance of weight loss. Ruth Elliott agreed to continue with her weight loss efforts with healthier diet and exercise as an essential part of her treatment plan. Will continue to watch over time.  Class 2 severe obesity with serious comorbidity and body mass index (BMI) of 35.0 to 35.9 in adult, unspecified obesity type (Ruth Elliott).  Ruth Elliott is currently in the action stage of change. As such, her goal is to continue with weight loss efforts. She has agreed to the Category 4 Plan.   She will increase her protein and water intake and will increase her weights.  Exercise goals: Ruth Elliott will continue walking 10,000 steps 5 times per week and walking with the horses.  Behavioral modification strategies: increasing lean protein intake, decreasing simple carbohydrates, increasing vegetables, increasing water intake, decreasing eating out, no skipping meals, meal planning and cooking strategies, keeping healthy foods in the home and planning for success.  Ruth Elliott has agreed to follow-up with our clinic in 2-3 weeks. She was informed of the importance of frequent follow-up visits to maximize her success with intensive lifestyle modifications for her multiple health conditions.   Objective:   Blood pressure (!) 157/102, pulse 80, temperature 98.9 F (37.2 C),  height 5\' 6"  (1.676 m), weight 219 lb (99.3 kg), SpO2 97 %. Body mass index is 35.35 kg/m.  General: Cooperative, alert, well developed, in no acute distress. HEENT: Conjunctivae and lids unremarkable. Cardiovascular: Regular rhythm.  Lungs: Normal work of breathing. Neurologic: No focal deficits.   Lab Results  Component Value Date    CREATININE 0.73 08/09/2019   BUN 9 08/09/2019   NA 139 08/09/2019   K 3.7 08/09/2019   CL 103 08/09/2019   CO2 24 08/09/2019   Lab Results  Component Value Date   ALT 15 09/13/2019   AST 23 09/13/2019   ALKPHOS 64 09/13/2019   BILITOT 0.4 09/13/2019   Lab Results  Component Value Date   HGBA1C 5.5 08/09/2019   HGBA1C 5.3 09/28/2018   HGBA1C 5.4 08/18/2017   HGBA1C 5.1 08/19/2016   HGBA1C 5.0 07/13/2015   Lab Results  Component Value Date   INSULIN 12.7 08/30/2019   Lab Results  Component Value Date   TSH 1.420 09/28/2018   Lab Results  Component Value Date   CHOL 94 (L) 08/09/2019   HDL 38 (L) 08/09/2019   LDLCALC 37 08/09/2019   TRIG 98 08/09/2019   CHOLHDL 2.5 08/09/2019   Lab Results  Component Value Date   WBC 6.8 08/09/2019   HGB 15.1 08/09/2019   HCT 42.3 08/09/2019   MCV 96 08/09/2019   PLT 172 08/09/2019   No results found for: IRON, TIBC, FERRITIN  Attestation Statements:   Reviewed by clinician on day of visit: allergies, medications, problem list, medical history, surgical history, family history, social history, and previous encounter notes.  Time spent on visit including pre-visit chart review and post-visit charting and care was 20 minutes.   Migdalia Dk, am acting as Location manager for [employer], DO   I have reviewed the above documentation for accuracy and completeness, and I agree with the above. Jearld Lesch, DO

## 2019-11-22 ENCOUNTER — Telehealth (INDEPENDENT_AMBULATORY_CARE_PROVIDER_SITE_OTHER): Payer: Self-pay | Admitting: Gastroenterology

## 2019-11-22 ENCOUNTER — Other Ambulatory Visit: Payer: Self-pay

## 2019-11-22 DIAGNOSIS — Z1211 Encounter for screening for malignant neoplasm of colon: Secondary | ICD-10-CM

## 2019-11-22 MED ORDER — NA SULFATE-K SULFATE-MG SULF 17.5-3.13-1.6 GM/177ML PO SOLN: 1.0000 | Freq: Once | ORAL | 0 refills | Status: AC

## 2019-11-22 NOTE — Progress Notes (Signed)
Gastroenterology Pre-Procedure Review  Request Date: Monday 01/24/20 Requesting Physician: Dr. Vicente Males  PATIENT REVIEW QUESTIONS: The patient responded to the following health history questions as indicated:    1. Are you having any GI issues? no 2. Do you have a personal history of Polyps? no 3. Do you have a family history of Colon Cancer or Polyps? no 4. Diabetes Mellitus? no 5. Joint replacements in the past 12 months?no 6. Major health problems in the past 3 months?no 7. Any artificial heart valves, MVP, or defibrillator?no    MEDICATIONS & ALLERGIES:    Patient reports the following regarding taking any anticoagulation/antiplatelet therapy:   Plavix, Coumadin, Eliquis, Xarelto, Lovenox, Pradaxa, Brilinta, or Effient? no Aspirin? no  Patient confirms/reports the following medications:  Current Outpatient Medications  Medication Sig Dispense Refill  . amLODipine (NORVASC) 5 MG tablet Take 1 tablet (5 mg total) by mouth daily. 90 tablet 4  . cholecalciferol (VITAMIN D3) 25 MCG (1000 UNIT) tablet Take 1,000 Units by mouth daily.    . COLLAGEN PO Take 1 Scoop by mouth daily. Collagen Peptide in coffee in am     . fexofenadine (ALLEGRA) 180 MG tablet Take 180 mg by mouth daily.    . Flax Oil-Fish Oil-Borage Oil (FISH OIL-FLAX OIL-BORAGE OIL) CAPS Take 2 capsules by mouth daily.     . fluticasone (FLONASE) 50 MCG/ACT nasal spray SHAKE LIQUID AND USE 2 SPRAYS IN EACH NOSTRIL DAILY 16 g 1  . ibuprofen (ADVIL) 400 MG tablet Take 400 mg by mouth every 6 (six) hours as needed.    . Multiple Vitamin (MULTIVITAMIN) capsule Take 1 capsule by mouth daily.    . norethindrone-ethinyl estradiol (MICROGESTIN,JUNEL,LOESTRIN) 1-20 MG-MCG tablet Take 1 tablet by mouth daily.  0   No current facility-administered medications for this visit.    Patient confirms/reports the following allergies:  Allergies  Allergen Reactions  . Penicillins Shortness Of Breath    Did it involve swelling of the  face/tongue/throat, SOB, or low BP? Yes Did it involve sudden or severe rash/hives, skin peeling, or any reaction on the inside of your mouth or nose? No Did you need to seek medical attention at a hospital or doctor's office? Yes When did it last happen?childhood If all above answers are "NO", may proceed with cephalosporin use.   [name] Hydralazine     Elevated blood pressure, migraine, and lethary  . Lisinopril     paresthesia  . Singulair [Montelukast Sodium]     Moody    No orders of the defined types were placed in this encounter.   AUTHORIZATION INFORMATION Primary Insurance: 1D#: Group #:  Secondary Insurance: 1D#: Group #:  SCHEDULE INFORMATION: Date: Monday 01/24/20 Time: Location:[facility]

## 2019-11-24 ENCOUNTER — Other Ambulatory Visit: Payer: Self-pay

## 2019-11-24 ENCOUNTER — Ambulatory Visit: Payer: Managed Care, Other (non HMO) | Admitting: Dermatology

## 2019-11-24 DIAGNOSIS — D229 Melanocytic nevi, unspecified: Secondary | ICD-10-CM

## 2019-11-24 DIAGNOSIS — L720 Epidermal cyst: Secondary | ICD-10-CM | POA: Diagnosis not present

## 2019-11-24 DIAGNOSIS — L814 Other melanin hyperpigmentation: Secondary | ICD-10-CM

## 2019-11-24 NOTE — Patient Instructions (Signed)
   Pre-Operative Instructions  You are scheduled for a surgical procedure at [facility]. We recommend you read the following instructions. If you have any questions or concerns, please call the office at [phone].  1. Shower and wash the entire body with soap and water the day of your surgery paying special attention to cleansing at and around the planned surgery site.  2. Avoid aspirin or aspirin containing products at least fourteen (14) days prior to your surgical procedure and for at least one week (7 Days) after your surgical procedure. If you take aspirin on a regular basis for heart disease or history of stroke or for any other reason, we may recommend you continue taking aspirin but please notify us if you take this on a regular basis. Aspirin can cause more bleeding to occur during surgery as well as prolonged bleeding and bruising after surgery.   3. Avoid other nonsteroidal pain medications at least one week prior to surgery and at least one week prior to your surgery. These include medications such as Ibuprofen (Motrin, Advil and Nuprin), Naprosyn, Voltaren, Relafen, etc. If medications are used for therapeutic reasons, please inform us as they can cause increased bleeding or prolonged bleeding during and bruising after surgical procedures.   4. Please advise us if you are taking any "blood thinner" medications such as Coumadin or Dipyridamole or Plavix or similar medications. These cause increased bleeding and prolonged bleeding during procedures and bruising after surgical procedures. We may have to consider discontinuing these medications briefly prior to and shortly after your surgery if safe to do so.   5. Please inform us of all medications you are currently taking. All medications that are taken regularly should be taken the day of surgery as you always do. Nevertheless, we need to be informed of what medications you are taking prior to surgery to know whether they will  affect the procedure or cause any complications.   6. Please inform us of any medication allergies. Also inform us of whether you have allergies to Latex or rubber products or whether you have had any adverse reaction to Lidocaine or Epinephrine.  7. Please inform us of any prosthetic or artificial body parts such as artificial heart valve, joint replacements, etc., or similar condition that might require preoperative antibiotics.   8. We recommend avoidance of alcohol at least two weeks prior to surgery and continued avoidance for at least two weeks after surgery.   9. We recommend discontinuation of tobacco smoking at least two weeks prior to surgery and continued abstinence for at least two weeks after surgery.  10. Do not plan strenuous exercise, strenuous work or strenuous lifting for approximately four weeks after your surgery.   11. We request if you are unable to make your scheduled surgical appointment, please call us at least a week in advance or as soon as you are aware of a problem so that we can cancel or reschedule the appointment.   12. You MAY TAKE TYLENOL (acetaminophen) for pain as it is not a blood thinner.   13. PLEASE PLAN TO BE IN TOWN FOR TWO WEEKS FOLLOWING SURGERY, THIS IS IMPORTANT SO YOU CAN BE CHECKED FOR DRESSING CHANGES, SUTURE REMOVAL AND TO MONITOR FOR POSSIBLE COMPLICATIONS.   

## 2019-11-24 NOTE — Progress Notes (Signed)
   New Patient Visit  Subjective  Ruth L Hannen is a 49 y.o. female who presents for the following: Cyst (on the back - has been there for a few years with a hx of rupture).  The following portions of the chart were reviewed this encounter and updated as appropriate:  Tobacco  Allergies  Meds  Problems  Med Hx  Surg Hx  Fam Hx     Review of Systems:  No other skin or systemic complaints except as noted in HPI or Assessment and Plan.  Objective  Well appearing patient in no apparent distress; mood and affect are within normal limits.  A focused examination was performed including the back and arms. Relevant physical exam findings are noted in the Assessment and Plan.  Objective  L back: 1.5 x 1.0 cm Subcutaneous nodule.   Assessment & Plan  Epidermal inclusion cyst L back Benign appearing, discussed surgical excision. Reassured benign.  Due to history of rupture recommend excision patient patient may consider.  Preop form given.  Melanocytic Nevi - Tan-brown and/or pink-flesh-colored symmetric macules and papules - Benign appearing on exam today - Observation - Call clinic for new or changing moles - Recommend daily use of broad spectrum spf 30+ sunscreen to sun-exposed areas.   Lentigines - Scattered tan macules - Discussed due to sun exposure - Benign, observe - Call for any changes  Melanocytic Nevi - Tan-brown and/or pink-flesh-colored symmetric macules and papules - Benign appearing on exam today - Observation - Call clinic for new or changing moles - Recommend daily use of broad spectrum spf 30+ sunscreen to sun-exposed areas.   Return for surgery and TBSE on the same day at the same time.  Luther Redo, CMA, am acting as scribe for Sarina Ser, MD .  Documentation: I have reviewed the above documentation for accuracy and completeness, and I agree with the above.  Sarina Ser, MD

## 2019-11-25 ENCOUNTER — Encounter: Payer: Self-pay | Admitting: Dermatology

## 2019-11-29 ENCOUNTER — Other Ambulatory Visit: Payer: Self-pay

## 2019-11-29 ENCOUNTER — Ambulatory Visit (INDEPENDENT_AMBULATORY_CARE_PROVIDER_SITE_OTHER): Payer: Managed Care, Other (non HMO) | Admitting: Bariatrics

## 2019-11-29 VITALS — BP 148/86 | HR 82 | Temp 97.9°F | Ht 66.0 in | Wt 216.0 lb

## 2019-11-29 DIAGNOSIS — E8881 Metabolic syndrome: Secondary | ICD-10-CM | POA: Diagnosis not present

## 2019-11-29 DIAGNOSIS — E669 Obesity, unspecified: Secondary | ICD-10-CM

## 2019-11-29 DIAGNOSIS — I10 Essential (primary) hypertension: Secondary | ICD-10-CM

## 2019-11-29 DIAGNOSIS — Z6834 Body mass index (BMI) 34.0-34.9, adult: Secondary | ICD-10-CM | POA: Diagnosis not present

## 2019-11-30 ENCOUNTER — Encounter (INDEPENDENT_AMBULATORY_CARE_PROVIDER_SITE_OTHER): Payer: Self-pay | Admitting: Bariatrics

## 2019-11-30 NOTE — Progress Notes (Signed)
Chief Complaint:   OBESITY Ruth Elliott is here to discuss her progress with her obesity treatment plan along with follow-up of her obesity related diagnoses. Ruth Elliott is on the Category 3 Plan and states she is following her eating plan approximately 90% of the time. [name] states she is riding horses 90 minutes 5 times per week and walking 2,000 steps 30 minutes 5 times per week.  Today's visit was #: 7 Starting weight: 223 lbs Starting date: 08/30/2019 Today's weight: 216 lbs Today's date: 11/29/2019 Total lbs lost to date: 7 Total lbs lost since last in-office visit: 3  Interim History: Ruth Elliott is down an additional 3 lbs. She has not been craving meat and reports no increase in hunger.  Subjective:   Essential hypertension. Ruth Elliott is taking Norvasc. Blood pressure is slightly elevated today, but reasonably well controlled.  BP Readings from Last 3 Encounters:  11/29/19 (!) 148/86  11/15/19 (!) 157/102  11/15/19 (!) 150/102   Lab Results  Component Value Date   CREATININE 0.73 08/09/2019   CREATININE 0.63 09/28/2018   CREATININE 0.74 06/03/2018   Insulin resistance. Ruth Elliott has a diagnosis of insulin resistance based on her elevated fasting insulin level >5. She continues to work on diet and exercise to decrease her risk of diabetes. She is on no medication.  Lab Results  Component Value Date   INSULIN 12.7 08/30/2019   Lab Results  Component Value Date   HGBA1C 5.5 08/09/2019   Assessment/Plan:   Essential hypertension. Ruth Elliott is working on healthy weight loss and exercise to improve blood pressure control. We will watch for signs of hypotension as she continues her lifestyle modifications. She will continue her medication as directed and will avoid added salt to her diet.  Insulin resistance. Ruth Elliott will continue to work on weight loss, exercise, increasing healthy fats and protein, and decreasing simple carbohydrates and refined sweets to help decrease the risk of  diabetes. [name] agreed to follow-up with [name] as directed to closely monitor her progress.  Class 1 obesity with serious comorbidity and body mass index (BMI) of 34.0 to 34.9 in adult, unspecified obesity type.  [name] is currently in the action stage of change. As such, her goal is to continue with weight loss efforts. She has agreed to the Category 3 Plan.   She will work on meal planning and increasing her protein and water intake.  Exercise goals: Ruth Elliott will continue her current exercise regimen.   Behavioral modification strategies: increasing lean protein intake, decreasing simple carbohydrates, increasing vegetables, increasing water intake, decreasing eating out, no skipping meals, meal planning and cooking strategies, keeping healthy foods in the home, ways to avoid boredom eating and planning for success.  Ruth Elliott has agreed to follow-up with our clinic in 2 weeks. She was informed of the importance of frequent follow-up visits to maximize her success with intensive lifestyle modifications for her multiple health conditions.   Objective:   Blood pressure (!) 148/86, pulse 82, temperature 97.9 F (36.6 C), height 5\' 6"  (1.676 m), weight 216 lb (98 kg), SpO2 96 %. Body mass index is 34.86 kg/m.  General: Cooperative, alert, well developed, in no acute distress. HEENT: Conjunctivae and lids unremarkable. Cardiovascular: Regular rhythm.  Lungs: Normal work of breathing. Neurologic: No focal deficits.   Lab Results  Component Value Date   CREATININE 0.73 08/09/2019   BUN 9 08/09/2019   NA 139 08/09/2019   K 3.7 08/09/2019   CL 103 08/09/2019   CO2 24  08/09/2019   Lab Results  Component Value Date   ALT 15 09/13/2019   AST 23 09/13/2019   ALKPHOS 64 09/13/2019   BILITOT 0.4 09/13/2019   Lab Results  Component Value Date   HGBA1C 5.5 08/09/2019   HGBA1C 5.3 09/28/2018   HGBA1C 5.4 08/18/2017   HGBA1C 5.1 08/19/2016   HGBA1C 5.0 07/13/2015   Lab Results  Component  Value Date   INSULIN 12.7 08/30/2019   Lab Results  Component Value Date   TSH 1.420 09/28/2018   Lab Results  Component Value Date   CHOL 94 (L) 08/09/2019   HDL 38 (L) 08/09/2019   LDLCALC 37 08/09/2019   TRIG 98 08/09/2019   CHOLHDL 2.5 08/09/2019   Lab Results  Component Value Date   WBC 6.8 08/09/2019   HGB 15.1 08/09/2019   HCT 42.3 08/09/2019   MCV 96 08/09/2019   PLT 172 08/09/2019   No results found for: IRON, TIBC, FERRITIN  Attestation Statements:   Reviewed by clinician on day of visit: allergies, medications, problem list, medical history, surgical history, family history, social history, and previous encounter notes.  Time spent on visit including pre-visit chart review and post-visit charting and care was 20 minutes.   Migdalia Dk, am acting as Location manager for [employer], DO   I have reviewed the above documentation for accuracy and completeness, and I agree with the above. Jearld Lesch, DO

## 2019-12-13 ENCOUNTER — Ambulatory Visit (INDEPENDENT_AMBULATORY_CARE_PROVIDER_SITE_OTHER): Payer: Managed Care, Other (non HMO) | Admitting: Bariatrics

## 2019-12-13 ENCOUNTER — Encounter (INDEPENDENT_AMBULATORY_CARE_PROVIDER_SITE_OTHER): Payer: Self-pay | Admitting: Bariatrics

## 2019-12-13 ENCOUNTER — Other Ambulatory Visit: Payer: Self-pay

## 2019-12-13 VITALS — BP 150/86 | HR 76 | Temp 98.3°F | Ht 66.0 in | Wt 221.0 lb

## 2019-12-13 DIAGNOSIS — Z6835 Body mass index (BMI) 35.0-35.9, adult: Secondary | ICD-10-CM

## 2019-12-13 DIAGNOSIS — E8881 Metabolic syndrome: Secondary | ICD-10-CM

## 2019-12-13 DIAGNOSIS — R7989 Other specified abnormal findings of blood chemistry: Secondary | ICD-10-CM

## 2019-12-13 NOTE — Progress Notes (Signed)
Chief Complaint:   OBESITY Ruth Elliott is here to discuss her progress with her obesity treatment plan along with follow-up of her obesity related diagnoses. Ruth Elliott is on the Category 3 Plan and states she is following her eating plan approximately 50% of the time. Ruth Elliott states she is walking the horse 20 minutes 4 times per week.  Today's visit was #: 8 Starting weight: 223 lbs Starting date: 08/30/2019 Today's weight: 221 lbs Today's date: 12/13/2019 Total lbs lost to date: 2 Total lbs lost since last in-office visit: 0  Interim History: Ruth Elliott is up 5 lbs since her last visit. She states she has been traveling more.  Subjective:   Insulin resistance. Ruth Elliott has a diagnosis of insulin resistance based on her elevated fasting insulin level >5. She continues to work on diet and exercise to decrease her risk of diabetes. Ruth Elliott is on no medication.  Lab Results  Component Value Date   INSULIN 12.7 08/30/2019   Lab Results  Component Value Date   HGBA1C 5.5 08/09/2019   Elevated LFTs. Ruth Elliott has a new dx of elevated ALT. Her BMI is over 40. She denies abdominal pain. She denies excessive alcohol intake.  Lab Results  Component Value Date   ALT 15 09/13/2019   AST 23 09/13/2019   ALKPHOS 64 09/13/2019   BILITOT 0.4 09/13/2019   Assessment/Plan:   Insulin resistance.  Ruth Elliott will continue to work on weight loss, exercise, and decreasing refined carbohydrates to help decrease the risk of diabetes. Ruth Elliott agreed to follow-up with [name] as directed to closely monitor her progress.  Elevated LFTs. We discussed the likely diagnosis of non-alcoholic fatty liver disease today and how this condition is obesity related. Ruth Elliott was educated the importance of weight loss. Ruth Elliott agreed to continue with her weight loss efforts with healthier diet and exercise as an essential part of her treatment plan. Will follow over time.  Class 2 severe obesity with serious comorbidity and body mass  index (BMI) of 35.0 to 35.9 in adult, unspecified obesity type ([place]).  Ruth Elliott is currently in the action stage of change. As such, her goal is to continue with weight loss efforts. She has agreed to the Category 3 Plan.   She will stay closely adherent to the meal plan, will be more consistent with her meals, and will take food to work.  Exercise goals: Ruth Elliott will continue walking 20 minutes 4 times per week.  Behavioral modification strategies: increasing lean protein intake, decreasing simple carbohydrates, increasing vegetables, increasing water intake, decreasing eating out, no skipping meals, meal planning and cooking strategies, keeping healthy foods in the home, travel eating strategies, holiday eating strategies , celebration eating strategies and avoiding temptations.  Ruth Elliott has agreed to follow-up with our clinic in 2-3 weeks. She was informed of the importance of frequent follow-up visits to maximize her success with intensive lifestyle modifications for her multiple health conditions.   Objective:   Blood pressure (!) 150/86, pulse 76, temperature 98.3 F (36.8 C), height 5\' 6"  (1.676 m), weight 221 lb (100.2 kg), SpO2 98 %. Body mass index is 35.67 kg/m.  General: Cooperative, alert, well developed, in no acute distress. HEENT: Conjunctivae and lids unremarkable. Cardiovascular: Regular rhythm.  Lungs: Normal work of breathing. Neurologic: No focal deficits.   Lab Results  Component Value Date   CREATININE 0.73 08/09/2019   BUN 9 08/09/2019   NA 139 08/09/2019   K 3.7 08/09/2019   CL 103 08/09/2019   CO2  24 08/09/2019   Lab Results  Component Value Date   ALT 15 09/13/2019   AST 23 09/13/2019   ALKPHOS 64 09/13/2019   BILITOT 0.4 09/13/2019   Lab Results  Component Value Date   HGBA1C 5.5 08/09/2019   HGBA1C 5.3 09/28/2018   HGBA1C 5.4 08/18/2017   HGBA1C 5.1 08/19/2016   HGBA1C 5.0 07/13/2015   Lab Results  Component Value Date   INSULIN 12.7 08/30/2019    Lab Results  Component Value Date   TSH 1.420 09/28/2018   Lab Results  Component Value Date   CHOL 94 (L) 08/09/2019   HDL 38 (L) 08/09/2019   LDLCALC 37 08/09/2019   TRIG 98 08/09/2019   CHOLHDL 2.5 08/09/2019   Lab Results  Component Value Date   WBC 6.8 08/09/2019   HGB 15.1 08/09/2019   HCT 42.3 08/09/2019   MCV 96 08/09/2019   PLT 172 08/09/2019   No results found for: IRON, TIBC, FERRITIN  Attestation Statements:   Reviewed by clinician on day of visit: allergies, medications, problem list, medical history, surgical history, family history, social history, and previous encounter notes.  Time spent on visit including pre-visit chart review and post-visit charting and care was 20 minutes.   Migdalia Dk, am acting as Location manager for [employer], DO   I have reviewed the above documentation for accuracy and completeness, and I agree with the above. Jearld Lesch, DO

## 2019-12-30 ENCOUNTER — Telehealth: Payer: Self-pay | Admitting: Gastroenterology

## 2019-12-30 NOTE — Telephone Encounter (Signed)
Pt needs to cancel procedure at hospital

## 2019-12-31 NOTE — Telephone Encounter (Signed)
Procedure cancelled. [facility] has been notified.

## 2020-01-03 ENCOUNTER — Encounter (INDEPENDENT_AMBULATORY_CARE_PROVIDER_SITE_OTHER): Payer: Self-pay | Admitting: Bariatrics

## 2020-01-03 ENCOUNTER — Ambulatory Visit (INDEPENDENT_AMBULATORY_CARE_PROVIDER_SITE_OTHER): Payer: Managed Care, Other (non HMO) | Admitting: Bariatrics

## 2020-01-03 ENCOUNTER — Other Ambulatory Visit: Payer: Self-pay

## 2020-01-03 VITALS — BP 149/86 | HR 77 | Temp 98.3°F | Ht 66.0 in | Wt 222.0 lb

## 2020-01-03 DIAGNOSIS — Z6835 Body mass index (BMI) 35.0-35.9, adult: Secondary | ICD-10-CM | POA: Diagnosis not present

## 2020-01-03 DIAGNOSIS — I10 Essential (primary) hypertension: Secondary | ICD-10-CM | POA: Diagnosis not present

## 2020-01-03 DIAGNOSIS — E8881 Metabolic syndrome: Secondary | ICD-10-CM

## 2020-01-03 NOTE — Progress Notes (Signed)
Chief Complaint:   OBESITY Ruth Elliott is here to discuss her progress with her obesity treatment plan along with follow-up of her obesity related diagnoses. Ruth Elliott is on the Category 3 Plan and states she is following her eating plan approximately 85% of the time. Ruth Elliott states she is riding horses 30 minutes 4 times per week and exercising using Nordic Track 20 minutes 1 time per week.  Today's visit was #: 9 Starting weight: 223 lbs Starting date: 08/30/2019 Today's weight: 222 lbs Today's date: 01/03/2020 Total lbs lost to date: 1 Total lbs lost since last in-office visit: 0  Interim History: Ruth Elliott is up 1 lb. She states that she may not be getting enough protein.  Subjective:   Essential hypertension. Blood pressure is slightly elevated today.  BP Readings from Last 3 Encounters:  01/03/20 (!) 149/86  12/13/19 (!) 150/86  11/29/19 (!) 148/86   Lab Results  Component Value Date   CREATININE 0.73 08/09/2019   CREATININE 0.63 09/28/2018   CREATININE 0.74 06/03/2018   Insulin resistance. Ruth Elliott has a diagnosis of insulin resistance based on her elevated fasting insulin level >5. She continues to work on diet and exercise to decrease her risk of diabetes. No polyphagia.  Lab Results  Component Value Date   INSULIN 12.7 08/30/2019   Lab Results  Component Value Date   HGBA1C 5.5 08/09/2019   Assessment/Plan:   Essential hypertension.  Karina is working on healthy weight loss and exercise to improve blood pressure control. We will watch for signs of hypotension as she continues her lifestyle modifications. She will continue her medication as directed.   Insulin resistance. Ruth Elliott will continue to work on weight loss, increasing activity, and decreasing simple carbohydrates and sweets to help decrease the risk of diabetes. Ruth Elliott agreed to follow-up with [name] as directed to closely monitor her progress.  Class 2 severe obesity with serious comorbidity and body mass  index (BMI) of 35.0 to 35.9 in adult, unspecified obesity type ([place]).  Ruth Elliott is currently in the action stage of change. As such, her goal is to continue with weight loss efforts. She has agreed to the Category 3 Plan.    She will work on meal planning, intentional eating, and will weigh protein/meat.  Exercise goals: Ruth Elliott will continue exercising using the Nordic Track.  Behavioral modification strategies: increasing lean protein intake, decreasing simple carbohydrates, increasing vegetables, increasing water intake, decreasing eating out, no skipping meals, meal planning and cooking strategies, keeping healthy foods in the home, dealing with family or coworker sabotage, travel eating strategies, holiday eating strategies , celebration eating strategies, avoiding temptations and planning for success.  Ruth Elliott has agreed to follow-up with our clinic in 2-3 weeks. She was informed of the importance of frequent follow-up visits to maximize her success with intensive lifestyle modifications for her multiple health conditions.   Objective:   Blood pressure (!) 149/86, pulse 77, temperature 98.3 F (36.8 C), height 5\' 6"  (1.676 m), weight 222 lb (100.7 kg), SpO2 98 %. Body mass index is 35.83 kg/m.  General: Cooperative, alert, well developed, in no acute distress. HEENT: Conjunctivae and lids unremarkable. Cardiovascular: Regular rhythm.  Lungs: Normal work of breathing. Neurologic: No focal deficits.   Lab Results  Component Value Date   CREATININE 0.73 08/09/2019   BUN 9 08/09/2019   NA 139 08/09/2019   K 3.7 08/09/2019   CL 103 08/09/2019   CO2 24 08/09/2019   Lab Results  Component Value Date  ALT 15 09/13/2019   AST 23 09/13/2019   ALKPHOS 64 09/13/2019   BILITOT 0.4 09/13/2019   Lab Results  Component Value Date   HGBA1C 5.5 08/09/2019   HGBA1C 5.3 09/28/2018   HGBA1C 5.4 08/18/2017   HGBA1C 5.1 08/19/2016   HGBA1C 5.0 07/13/2015   Lab Results  Component Value Date    INSULIN 12.7 08/30/2019   Lab Results  Component Value Date   TSH 1.420 09/28/2018   Lab Results  Component Value Date   CHOL 94 (L) 08/09/2019   HDL 38 (L) 08/09/2019   LDLCALC 37 08/09/2019   TRIG 98 08/09/2019   CHOLHDL 2.5 08/09/2019   Lab Results  Component Value Date   WBC 6.8 08/09/2019   HGB 15.1 08/09/2019   HCT 42.3 08/09/2019   MCV 96 08/09/2019   PLT 172 08/09/2019   No results found for: IRON, TIBC, FERRITIN  Attestation Statements:   Reviewed by clinician on day of visit: allergies, medications, problem list, medical history, surgical history, family history, social history, and previous encounter notes.  Time spent on visit including pre-visit chart review and post-visit charting and care was 20 minutes.   Migdalia Dk, am acting as Location manager for [employer], DO   I have reviewed the above documentation for accuracy and completeness, and I agree with the above. Jearld Lesch, DO

## 2020-01-06 IMAGING — US US THYROID
1 series · 13 of 25 positions shown · non-contrast
Comparison: None.

CLINICAL DATA: Palpable abnormality. 47-year-old female with left
neck nodule

EXAM:
THYROID ULTRASOUND
TECHNIQUE: Ultrasound examination of the thyroid gland and adjacent soft
tissues was performed.

[Series 1: us thyroid · 13 of 66 slices shown]
[im 1/66]
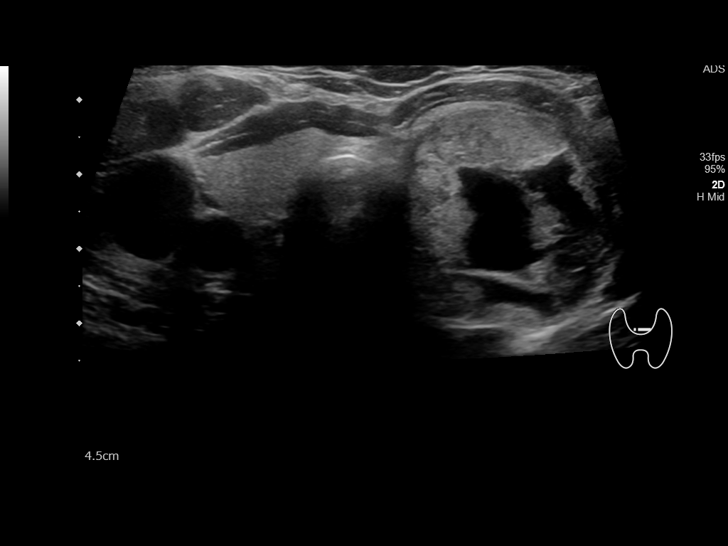
[im 6/66]
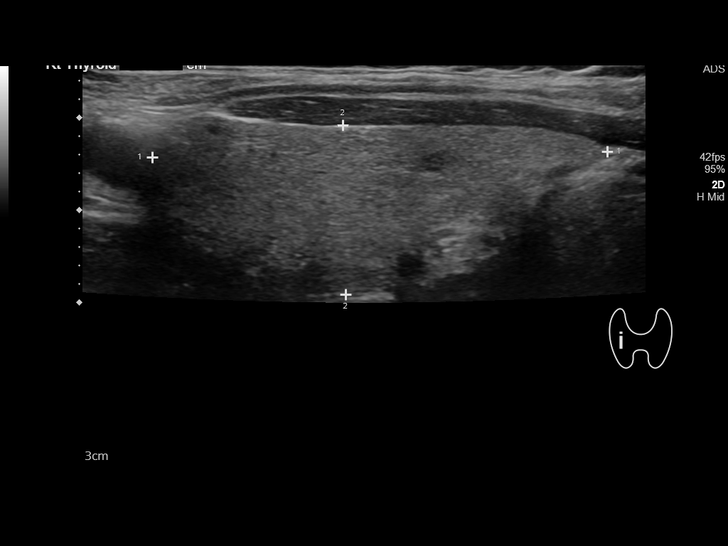
[im 11/66]
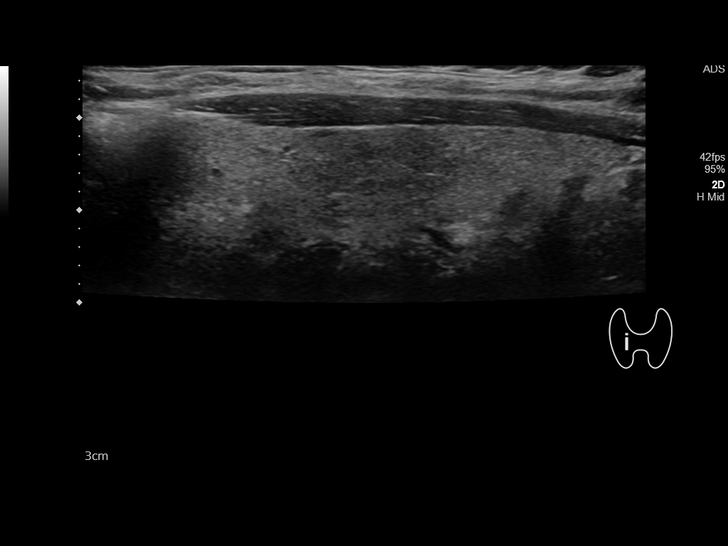
[im 17/66]
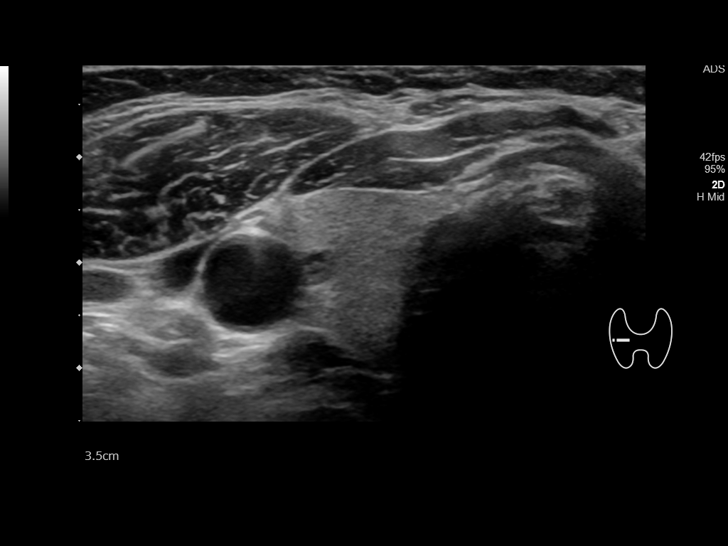
[im 22/66]
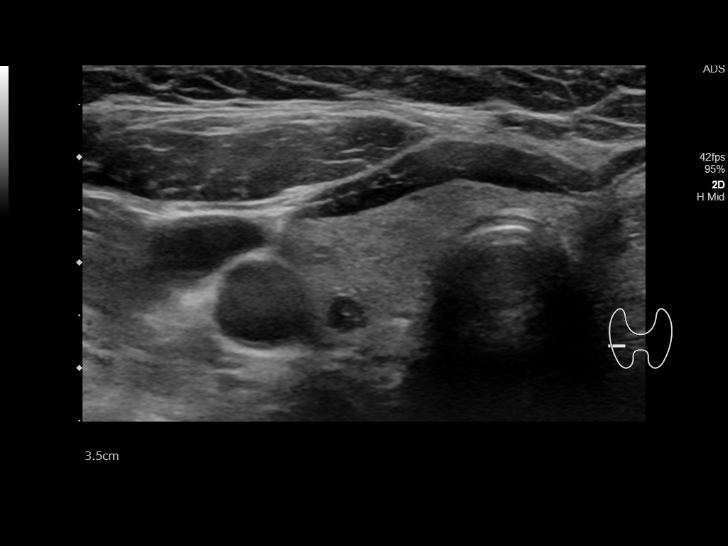
[im 28/66]
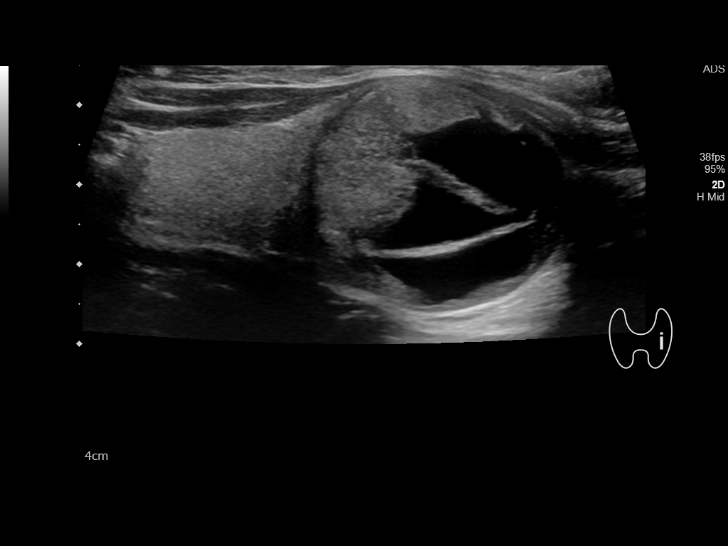
[im 33/66]
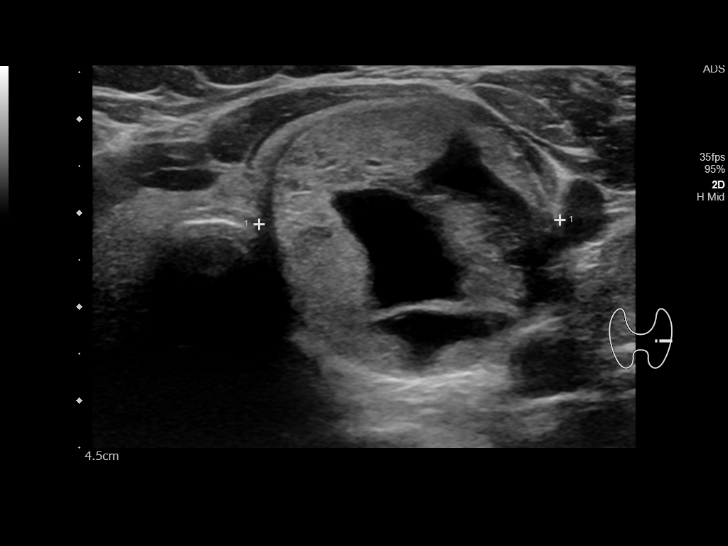
[im 38/66]
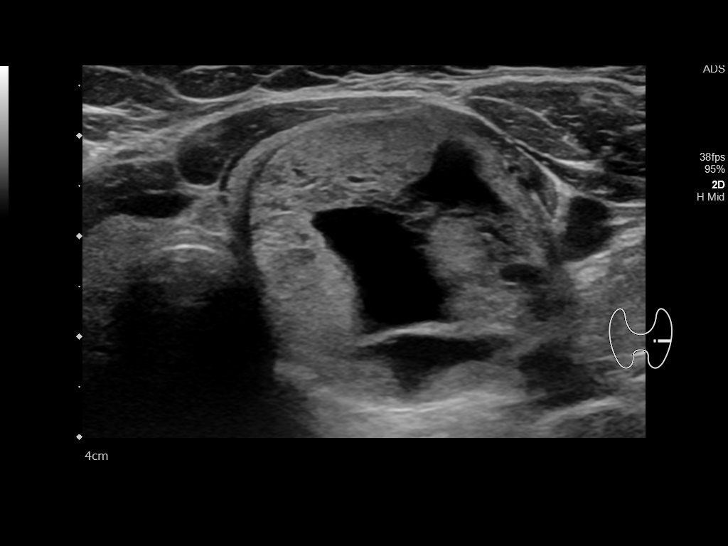
[im 44/66]
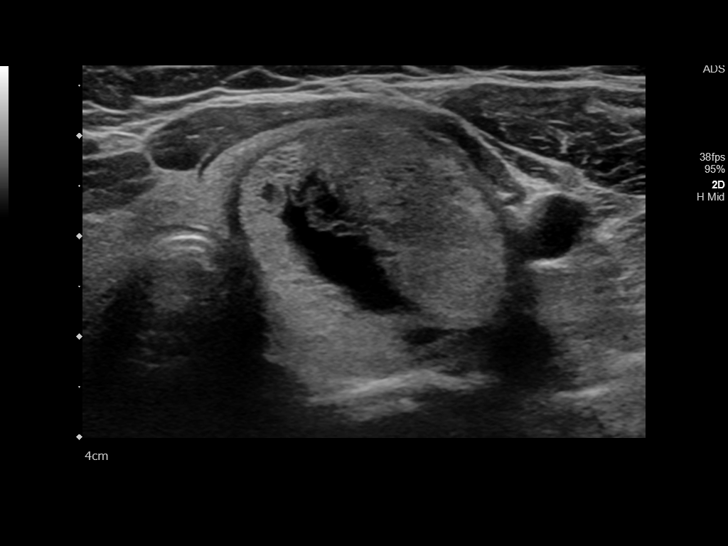
[im 49/66]
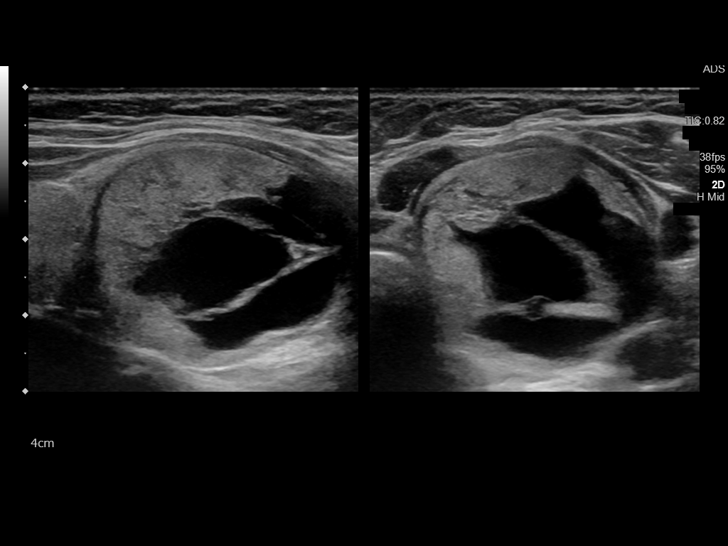
[im 55/66]
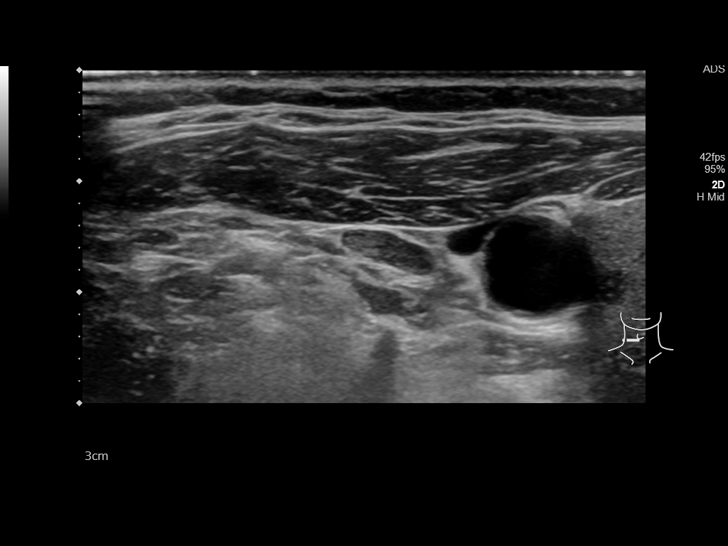
[im 60/66]
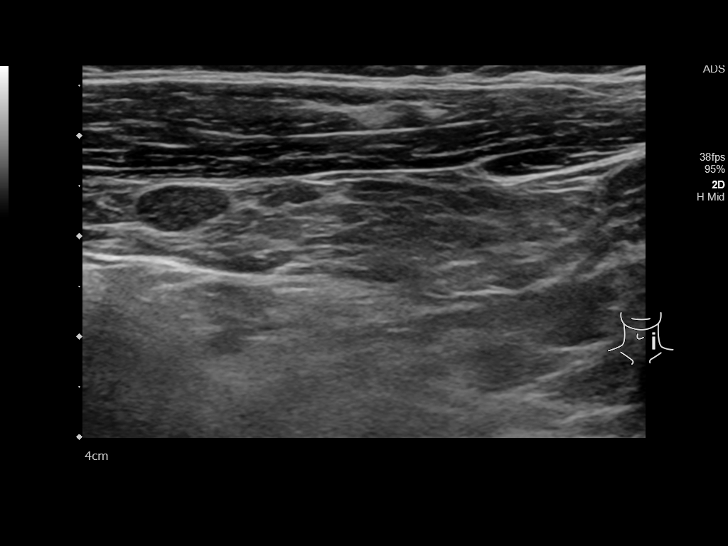
[im 66/66]
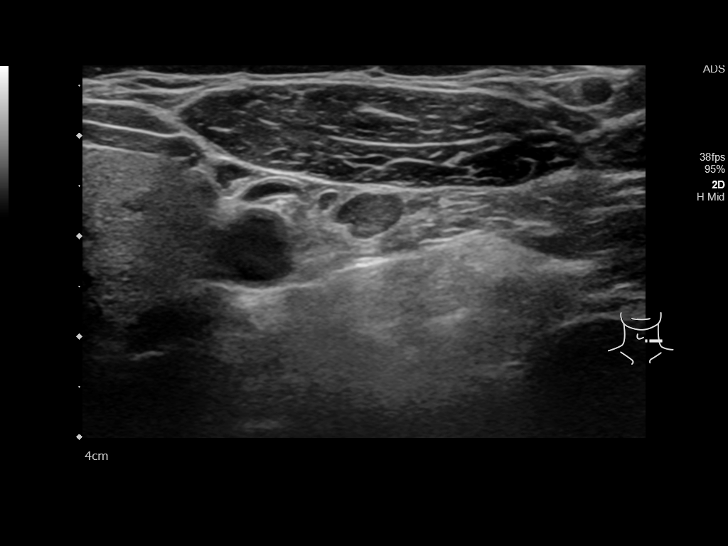

[13 of 25 positions shown; findings below may reference images not displayed]

FINDINGS: Parenchymal Echotexture: Normal

Isthmus: 0.4 cm

Right lobe: 4.9 x 1.8 x 1.8 cm

Left lobe: 5.7 x 3.0 x 3.2 cm

_________________________________________________________

Estimated total number of nodules >/= 1 cm: 1

Number of spongiform nodules >/=  2 cm not described below (TR1): 0

Number of mixed cystic and solid nodules >/= 1.5 cm not described
below (TR2): 0

_________________________________________________________

Nodule # 1:

Location: Left; Inferior

Maximum size: 3.4 cm; Other 2 dimensions: 3.1 x 2.9 cm

Composition: mixed cystic and solid (1)

Echogenicity: isoechoic (1)

Shape: not taller-than-wide (0)

Margins: smooth (0)

Echogenic foci: none (0)

ACR TI-RADS total points: 2.

ACR TI-RADS risk category: TR2 (2 points).

ACR TI-RADS recommendations:

This nodule does NOT meet TI-RADS criteria for biopsy or dedicated
follow-up.

_________________________________________________________
IMPRESSION: 1. Moderately large TI-RADS category 2 (low risk) mixed cystic and
solid nodule in the left mid and inferior gland. This lesion does
not meet criteria for biopsy or further dedicated imaging follow-up.

The above is in keeping with the ACR TI-RADS recommendations - [HOSPITAL] 9543;[DATE].

## 2020-01-24 ENCOUNTER — Encounter (INDEPENDENT_AMBULATORY_CARE_PROVIDER_SITE_OTHER): Payer: Self-pay | Admitting: Bariatrics

## 2020-01-24 ENCOUNTER — Ambulatory Visit (INDEPENDENT_AMBULATORY_CARE_PROVIDER_SITE_OTHER): Payer: Managed Care, Other (non HMO) | Admitting: Bariatrics

## 2020-01-24 ENCOUNTER — Other Ambulatory Visit: Payer: Self-pay

## 2020-01-24 ENCOUNTER — Ambulatory Visit: Admit: 2020-01-24 | Payer: Managed Care, Other (non HMO) | Admitting: Gastroenterology

## 2020-01-24 VITALS — BP 148/89 | HR 65 | Temp 97.8°F | Ht 66.0 in | Wt 227.0 lb

## 2020-01-24 DIAGNOSIS — Z6836 Body mass index (BMI) 36.0-36.9, adult: Secondary | ICD-10-CM

## 2020-01-24 DIAGNOSIS — E8881 Metabolic syndrome: Secondary | ICD-10-CM

## 2020-01-24 DIAGNOSIS — I10 Essential (primary) hypertension: Secondary | ICD-10-CM | POA: Diagnosis not present

## 2020-01-24 SURGERY — COLONOSCOPY WITH PROPOFOL
Anesthesia: General

## 2020-01-26 NOTE — Progress Notes (Unsigned)
Chief Complaint:   OBESITY Ruth Elliott is here to discuss her progress with her obesity treatment plan along with follow-up of her obesity related diagnoses. Bailynn is on the Category 3 Plan and states she is following her eating plan approximately 80% of the time. Zita states she is working with horses for 30 minutes 5 times per week.  Today's visit was #: 10 Starting weight: 223 lbs Starting date: 08/30/2019 Today's weight: 227 lbs Today's date: 01/24/2020 Total lbs lost to date: 0 Total lbs lost since last in-office visit: 0  Interim History: Elleanna is up 5 pounds since her last visit.  She has been unable to eat lunch due to her work schedule.  Her water weight is up.  Subjective:   1. Insulin resistance Kyann has a diagnosis of insulin resistance based on her elevated fasting insulin level >5. She continues to work on diet and exercise to decrease her risk of diabetes.  She is taking no medication for this.  Lab Results  Component Value Date   INSULIN 12.7 08/30/2019   Lab Results  Component Value Date   HGBA1C 5.5 08/09/2019   2. Essential hypertension Blood pressure is slightly elevated today.  She is taking Norvasc 5 mg daily.  Review: taking medications as instructed, no medication side effects noted, no chest pain on exertion, no dyspnea on exertion, no swelling of ankles.   BP Readings from Last 3 Encounters:  01/24/20 (!) 148/89  01/03/20 (!) 149/86  12/13/19 (!) 150/86   Assessment/Plan:   1. Insulin resistance Caterra will continue to work on weight loss, exercise, and decreasing simple carbohydrates to help decrease the risk of diabetes. Linna agreed to follow-up with [name] as directed to closely monitor her progress.  2. Essential hypertension Rahmah will focus on no added salt in her diet as well as continuing to work on diet and exercise.  She will continue her medication as prescribed.  3. Class 2 severe obesity with serious comorbidity and body mass index  (BMI) of 36.0 to 36.9 in adult, unspecified obesity type ([place])  Yannis is currently in the action stage of change. As such, her goal is to continue with weight loss efforts. She has agreed to the Category 3 Plan.   She will work on meal planning, mindful eating, will decrease meal skipping, and will increase her water intake.  Exercise goals: Consider joining the gym and continue the horse riding.  Behavioral modification strategies: increasing lean protein intake, decreasing simple carbohydrates, increasing vegetables, increasing water intake, decreasing eating out, no skipping meals, meal planning and cooking strategies, keeping healthy foods in the home and planning for success.  Alizeh has agreed to follow-up with our clinic in 2 weeks, fasting. She was informed of the importance of frequent follow-up visits to maximize her success with intensive lifestyle modifications for her multiple health conditions.   Objective:   Blood pressure (!) 148/89, pulse 65, temperature 97.8 F (36.6 C), temperature source Oral, height 5\' 6"  (1.676 m), weight 227 lb (103 kg), SpO2 97 %. Body mass index is 36.64 kg/m.  General: Cooperative, alert, well developed, in no acute distress. HEENT: Conjunctivae and lids unremarkable. Cardiovascular: Regular rhythm.  Lungs: Normal work of breathing. Neurologic: No focal deficits.   Lab Results  Component Value Date   CREATININE 0.73 08/09/2019   BUN 9 08/09/2019   NA 139 08/09/2019   K 3.7 08/09/2019   CL 103 08/09/2019   CO2 24 08/09/2019   Lab Results  Component Value Date   ALT 15 09/13/2019   AST 23 09/13/2019   ALKPHOS 64 09/13/2019   BILITOT 0.4 09/13/2019   Lab Results  Component Value Date   HGBA1C 5.5 08/09/2019   HGBA1C 5.3 09/28/2018   HGBA1C 5.4 08/18/2017   HGBA1C 5.1 08/19/2016   HGBA1C 5.0 07/13/2015   Lab Results  Component Value Date   INSULIN 12.7 08/30/2019   Lab Results  Component Value Date   TSH 1.420 09/28/2018    Lab Results  Component Value Date   CHOL 94 (L) 08/09/2019   HDL 38 (L) 08/09/2019   LDLCALC 37 08/09/2019   TRIG 98 08/09/2019   CHOLHDL 2.5 08/09/2019   Lab Results  Component Value Date   WBC 6.8 08/09/2019   HGB 15.1 08/09/2019   HCT 42.3 08/09/2019   MCV 96 08/09/2019   PLT 172 08/09/2019   Attestation Statements:   Reviewed by clinician on day of visit: allergies, medications, problem list, medical history, surgical history, family history, social history, and previous encounter notes.  Time spent on visit including pre-visit chart review and post-visit care and charting was 20 minutes.   I, Water quality scientist, CMA, am acting as Location manager for [employer], DO  I have reviewed the above documentation for accuracy and completeness, and I agree with the above. Jearld Lesch, DO

## 2020-01-27 ENCOUNTER — Encounter (INDEPENDENT_AMBULATORY_CARE_PROVIDER_SITE_OTHER): Payer: Self-pay | Admitting: Bariatrics

## 2020-02-01 ENCOUNTER — Telehealth: Payer: Self-pay

## 2020-02-01 ENCOUNTER — Ambulatory Visit: Payer: Managed Care, Other (non HMO) | Admitting: Dermatology

## 2020-02-01 ENCOUNTER — Other Ambulatory Visit: Payer: Self-pay

## 2020-02-01 ENCOUNTER — Other Ambulatory Visit: Payer: Self-pay | Admitting: Dermatology

## 2020-02-01 DIAGNOSIS — L720 Epidermal cyst: Secondary | ICD-10-CM

## 2020-02-01 MED ORDER — MUPIROCIN 2 % EX OINT: 1.0000 "application " | TOPICAL_OINTMENT | Freq: Every day | CUTANEOUS | 0 refills | Status: DC

## 2020-02-01 NOTE — Telephone Encounter (Signed)
Spoke with patient regarding surgery. She is doing fine/hd 

## 2020-02-01 NOTE — Progress Notes (Signed)
   Follow-Up Visit   Subjective  Ruth Elliott is a 50 y.o. female who presents for the following: Cyst (L back, pt presents for excision).  The following portions of the chart were reviewed this encounter and updated as appropriate:   Tobacco  Allergies  Meds  Problems  Med Hx  Surg Hx  Fam Hx     Review of Systems:  No other skin or systemic complaints except as noted in HPI or Assessment and Plan.  Objective  Well appearing patient in no apparent distress; mood and affect are within normal limits.  A focused examination was performed including back. Relevant physical exam findings are noted in the Assessment and Plan.  Objective  Left Lower Back: Subcutaneous nodule.    Assessment & Plan  Epidermal inclusion cyst Left Lower Back  mupirocin ointment (BACTROBAN) 2 % - Left Lower Back  Skin excision - Left Lower Back  Lesion length (cm):  2.5 Lesion width (cm):  1.5 Margin per side (cm):  0 Total excision diameter (cm):  2.5 Informed consent: discussed and consent obtained   Timeout: patient name, date of birth, surgical site, and procedure verified   Procedure prep:  Patient was prepped and draped in usual sterile fashion Prep type:  Isopropyl alcohol and povidone-iodine Anesthesia: the lesion was anesthetized in a standard fashion   Anesthetic:  1% lidocaine w/ epinephrine 1-100,000 buffered w/ 8.4% NaHCO3 Instrument used: #15 blade   Hemostasis achieved with: pressure   Hemostasis achieved with comment:  Electrocautery Outcome: patient tolerated procedure well with no complications   Post-procedure details: sterile dressing applied and wound care instructions given   Dressing type: bandage and pressure dressing (mupirocin)    Skin repair - Left Lower Back Complexity:  Complex Final length (cm):  3 Reason for type of repair: reduce tension to allow closure, reduce the risk of dehiscence, infection, and necrosis, reduce subcutaneous dead space and avoid a  hematoma, allow closure of the large defect, preserve normal anatomy, preserve normal anatomical and functional relationships and enhance both functionality and cosmetic results   Undermining: area extensively undermined   Undermining comment:  Undermining defect 2.0 cm Subcutaneous layers (deep stitches):  Suture size:  2-0 Suture type: Vicryl (polyglactin 910)   Subcutaneous suture technique: inverted dermal. Fine/surface layer approximation (top stitches):  Suture size:  3-0 Suture type: nylon   Stitches: simple running   Suture removal (days):  7 Hemostasis achieved with: suture and pressure Outcome: patient tolerated procedure well with no complications   Post-procedure details: sterile dressing applied and wound care instructions given   Dressing type: bandage and pressure dressing (mupirocin)    Other Related Procedures Anatomic Pathology Report  Return in about 1 week (around 02/08/2020) for suture removal, TBSE.  I, Ashok Cordia, CMA, am acting as scribe for Sarina Ser, MD .  Documentation: I have reviewed the above documentation for accuracy and completeness, and I agree with the above.  Sarina Ser, MD

## 2020-02-01 NOTE — Patient Instructions (Signed)

## 2020-02-02 ENCOUNTER — Encounter: Payer: Self-pay | Admitting: Dermatology

## 2020-02-08 ENCOUNTER — Other Ambulatory Visit: Payer: Self-pay

## 2020-02-08 ENCOUNTER — Ambulatory Visit (INDEPENDENT_AMBULATORY_CARE_PROVIDER_SITE_OTHER): Payer: Managed Care, Other (non HMO) | Admitting: Dermatology

## 2020-02-08 DIAGNOSIS — D485 Neoplasm of uncertain behavior of skin: Secondary | ICD-10-CM

## 2020-02-08 NOTE — Progress Notes (Unsigned)
   Follow-Up Visit   Subjective  Ruth L Dedic is a 50 y.o. female who presents for the following: post op./suture removal (Patient is here today for suture removal. Pathology results are not yet available. ).  The following portions of the chart were reviewed this encounter and updated as appropriate:   Tobacco  Allergies  Meds  Problems  Med Hx  Surg Hx  Fam Hx     Review of Systems:  No other skin or systemic complaints except as noted in HPI or Assessment and Plan.  Objective  Well appearing patient in no apparent distress; mood and affect are within normal limits.  A focused examination was performed including the back. Relevant physical exam findings are noted in the Assessment and Plan.  Objective  Left Lower Back: Healing excision site   Assessment & Plan  Neoplasm of uncertain behavior of skin - suspected cyst Left Lower Back  Encounter for Removal of Sutures - Incision site at the L back is clean, dry and intact - Wound cleansed, sutures removed, wound cleansed and steri strips applied.  - Will contact patient with pathology results when available.  - Patient advised to keep steri-strips dry until they fall off. - Scars remodel for a full year. - Once steri-strips fall off, patient can apply over-the-counter silicone scar cream each night to help with scar remodeling if desired. - Patient advised to call with any concerns or if they notice any new or changing lesions.  Return for TBSE.  Luther Redo, CMA, am acting as scribe for Sarina Ser, MD .  Documentation: I have reviewed the above documentation for accuracy and completeness, and I agree with the above.  Sarina Ser, MD

## 2020-02-11 ENCOUNTER — Encounter: Payer: Self-pay | Admitting: Dermatology

## 2020-02-14 ENCOUNTER — Other Ambulatory Visit: Payer: Self-pay

## 2020-02-14 ENCOUNTER — Encounter (INDEPENDENT_AMBULATORY_CARE_PROVIDER_SITE_OTHER): Payer: Self-pay | Admitting: Bariatrics

## 2020-02-14 ENCOUNTER — Ambulatory Visit (INDEPENDENT_AMBULATORY_CARE_PROVIDER_SITE_OTHER): Payer: Managed Care, Other (non HMO) | Admitting: Bariatrics

## 2020-02-14 VITALS — BP 146/84 | HR 74 | Temp 97.9°F | Ht 66.0 in | Wt 222.0 lb

## 2020-02-14 DIAGNOSIS — Z6835 Body mass index (BMI) 35.0-35.9, adult: Secondary | ICD-10-CM

## 2020-02-14 DIAGNOSIS — Z9189 Other specified personal risk factors, not elsewhere classified: Secondary | ICD-10-CM

## 2020-02-14 DIAGNOSIS — R7989 Other specified abnormal findings of blood chemistry: Secondary | ICD-10-CM

## 2020-02-14 DIAGNOSIS — E8881 Metabolic syndrome: Secondary | ICD-10-CM

## 2020-02-14 DIAGNOSIS — E786 Lipoprotein deficiency: Secondary | ICD-10-CM

## 2020-02-14 DIAGNOSIS — I10 Essential (primary) hypertension: Secondary | ICD-10-CM | POA: Diagnosis not present

## 2020-02-14 DIAGNOSIS — E88819 Insulin resistance, unspecified: Secondary | ICD-10-CM

## 2020-02-14 DIAGNOSIS — E559 Vitamin D deficiency, unspecified: Secondary | ICD-10-CM | POA: Diagnosis not present

## 2020-02-14 NOTE — Progress Notes (Signed)
Chief Complaint:   OBESITY Ruth Elliott is here to discuss her progress with her obesity treatment plan along with follow-up of her obesity related diagnoses. Ruth Elliott is on the Category 3 Plan and states she is following her eating plan approximately 85% of the time. Ruth Elliott states she is walking for 20-30 minutes 3 times per week.  Today's visit was #: 11 Starting weight: 223 lbs Starting date: 08/30/2019 Today's weight: 222 lbs Today's date: 02/14/2020 Total lbs lost to date: 1 lb Total lbs lost since last in-office visit: 5 lbs  Interim History: Ruth Elliott is down an additional 5 pounds since her last visit.  She has not been riding and had removal of an inclusion cyst.  Subjective:   1. Essential hypertension Reasonably well controlled.  Review: taking medications as instructed, no medication side effects noted, no chest pain on exertion, no dyspnea on exertion, no swelling of ankles.  Taking Norvasc.  BP Readings from Last 3 Encounters:  02/14/20 (!) 146/84  01/24/20 (!) 148/89  01/03/20 (!) 149/86   2. Elevated LFTs She denies abdominal pain or jaundice and has never been told of any liver problems in the past.   Lab Results  Component Value Date   ALT 15 09/13/2019   AST 23 09/13/2019   ALKPHOS 64 09/13/2019   BILITOT 0.4 09/13/2019   3. Low HDL (under 40) Ruth Elliott's HDL was 38 on 08/09/2019.  Lab Results  Component Value Date   ALT 15 09/13/2019   AST 23 09/13/2019   ALKPHOS 64 09/13/2019   BILITOT 0.4 09/13/2019   Lab Results  Component Value Date   CHOL 94 (L) 08/09/2019   HDL 38 (L) 08/09/2019   LDLCALC 37 08/09/2019   TRIG 98 08/09/2019   CHOLHDL 2.5 08/09/2019   4. Vitamin D deficiency Sarahi's Vitamin D level was 47.8 on 08/30/2019. She is currently taking OTC vitamin D 1,000 IU each day. She denies nausea, vomiting or muscle weakness.  5. Insulin resistance Ruth Elliott has a diagnosis of insulin resistance based on her elevated fasting insulin level >5. She continues  to work on diet and exercise to decrease her risk of diabetes.    Lab Results  Component Value Date   INSULIN 12.7 08/30/2019   Lab Results  Component Value Date   HGBA1C 5.5 08/09/2019   6. At risk for heart disease Kiki is at a higher than average risk for cardiovascular disease due to obesity and low HDL.   Assessment/Plan:   1. Essential hypertension Mozel is working on healthy weight loss and exercise to improve blood pressure control. We will watch for signs of hypotension as she continues her lifestyle modifications.   Continue medication.  2. Elevated LFTs Will check CMP today.  - Comprehensive metabolic panel  3. Low HDL (under 40) Will check lipid panel today, as per below.  - Lipid Panel With LDL/HDL Ratio  4. Vitamin D deficiency Low Vitamin D level contributes to fatigue and are associated with obesity, breast, and colon cancer.  Will check vitamin D level today, as per below.  - VITAMIN D 25 Hydroxy (Vit-D Deficiency, Fractures)  5. Insulin resistance Ruth Elliott will continue to work on weight loss, exercise, and decreasing simple carbohydrates to help decrease the risk of diabetes. Ruth Elliott agreed to follow-up with [name] as directed to closely monitor her progress.  Will check insulin level today.  - Insulin, random  6. At risk for heart disease Ruth Elliott was given approximately 15 minutes of coronary artery disease  prevention counseling today. She is 50 y.o. female and has risk factors for heart disease including obesity. We discussed intensive lifestyle modifications today with an emphasis on specific weight loss instructions and strategies.   Repetitive spaced learning was employed today to elicit superior memory formation and behavioral change.  7. Class 2 severe obesity with serious comorbidity and body mass index (BMI) of 35.0 to 35.9 in adult, unspecified obesity type ([place])  Ruth Elliott is currently in the action stage of change. As such, her goal is to continue with  weight loss efforts. She has agreed to the Category 3 Plan.   She will work on meal planning, will adhere closely to the meal plan, and will increase her water and protein intake.  Recipes handout was provided today.  Exercise goals: For substantial health benefits, adults should do at least 150 minutes (2 hours and 30 minutes) a week of moderate-intensity, or 75 minutes (1 hour and 15 minutes) a week of vigorous-intensity aerobic physical activity, or an equivalent combination of moderate- and vigorous-intensity aerobic activity. Aerobic activity should be performed in episodes of at least 10 minutes, and preferably, it should be spread throughout the week.  Behavioral modification strategies: increasing lean protein intake, decreasing simple carbohydrates, increasing vegetables, increasing water intake, decreasing eating out, no skipping meals, meal planning and cooking strategies, keeping healthy foods in the home, dealing with family or coworker sabotage, travel eating strategies, holiday eating strategies  and celebration eating strategies.  Peighton has agreed to follow-up with our clinic in 2 weeks. She was informed of the importance of frequent follow-up visits to maximize her success with intensive lifestyle modifications for her multiple health conditions.   Objective:   Blood pressure (!) 146/84, pulse 74, temperature 97.9 F (36.6 C), height 5\' 6"  (1.676 m), weight 222 lb (100.7 kg), SpO2 98 %. Body mass index is 35.83 kg/m.  General: Cooperative, alert, well developed, in no acute distress. HEENT: Conjunctivae and lids unremarkable. Cardiovascular: Regular rhythm.  Lungs: Normal work of breathing. Neurologic: No focal deficits.   Lab Results  Component Value Date   CREATININE 0.73 08/09/2019   BUN 9 08/09/2019   NA 139 08/09/2019   K 3.7 08/09/2019   CL 103 08/09/2019   CO2 24 08/09/2019   Lab Results  Component Value Date   ALT 15 09/13/2019   AST 23 09/13/2019    ALKPHOS 64 09/13/2019   BILITOT 0.4 09/13/2019   Lab Results  Component Value Date   HGBA1C 5.5 08/09/2019   HGBA1C 5.3 09/28/2018   HGBA1C 5.4 08/18/2017   HGBA1C 5.1 08/19/2016   HGBA1C 5.0 07/13/2015   Lab Results  Component Value Date   INSULIN 12.7 08/30/2019   Lab Results  Component Value Date   TSH 1.420 09/28/2018   Lab Results  Component Value Date   CHOL 94 (L) 08/09/2019   HDL 38 (L) 08/09/2019   LDLCALC 37 08/09/2019   TRIG 98 08/09/2019   CHOLHDL 2.5 08/09/2019   Lab Results  Component Value Date   WBC 6.8 08/09/2019   HGB 15.1 08/09/2019   HCT 42.3 08/09/2019   MCV 96 08/09/2019   PLT 172 08/09/2019   Attestation Statements:   Reviewed by clinician on day of visit: allergies, medications, problem list, medical history, surgical history, family history, social history, and previous encounter notes.  I, Water quality scientist, CMA, am acting as Location manager for [employer], DO  I have reviewed the above documentation for accuracy and completeness, and I agree with  the above. Jearld Lesch, DO

## 2020-02-15 LAB — INSULIN, RANDOM: INSULIN: 16.2 u[IU]/mL (ref 2.6–24.9)

## 2020-02-15 LAB — LIPID PANEL WITH LDL/HDL RATIO
Cholesterol, Total: 112 mg/dL (ref 100–199)
HDL: 42 mg/dL (ref >39)
LDL Chol Calc (NIH): 55 mg/dL (ref 0–99)
LDL/HDL Ratio: 1.3 ratio (ref 0.0–3.2)
Triglycerides: 69 mg/dL (ref 0–149)
VLDL Cholesterol Cal: 15 mg/dL (ref 5–40)

## 2020-02-15 LAB — COMPREHENSIVE METABOLIC PANEL
ALT: 19 IU/L (ref 0–32)
AST: 22 IU/L (ref 0–40)
Albumin/Globulin Ratio: 1.6 (ref 1.2–2.2)
Albumin: 4.3 g/dL (ref 3.8–4.8)
Alkaline Phosphatase: 62 IU/L (ref 44–121)
BUN/Creatinine Ratio: 19 (ref 9–23)
BUN: 12 mg/dL (ref 6–24)
Bilirubin Total: 0.6 mg/dL (ref 0.0–1.2)
CO2: 23 mmol/L (ref 20–29)
Calcium: 9 mg/dL (ref 8.7–10.2)
Chloride: 103 mmol/L (ref 96–106)
Creatinine, Ser: 0.63 mg/dL (ref 0.57–1.00)
GFR calc Af Amer: 121 mL/min/{1.73_m2} (ref >59)
GFR calc non Af Amer: 105 mL/min/{1.73_m2} (ref >59)
Globulin, Total: 2.7 g/dL (ref 1.5–4.5)
Glucose: 81 mg/dL (ref 65–99)
Potassium: 4.1 mmol/L (ref 3.5–5.2)
Sodium: 140 mmol/L (ref 134–144)
Total Protein: 7 g/dL (ref 6.0–8.5)

## 2020-02-15 LAB — VITAMIN D 25 HYDROXY (VIT D DEFICIENCY, FRACTURES): Vit D, 25-Hydroxy: 37.1 ng/mL (ref 30.0–100.0)

## 2020-02-16 ENCOUNTER — Other Ambulatory Visit: Payer: Self-pay | Admitting: Cardiovascular Disease

## 2020-02-16 NOTE — Telephone Encounter (Signed)
Please call pt to schedule overdue follow-up appointment with Dr. Rockey Situ for refills. Thank you!

## 2020-02-16 NOTE — Telephone Encounter (Signed)
Scheduled with gollan in march

## 2020-02-28 ENCOUNTER — Encounter (INDEPENDENT_AMBULATORY_CARE_PROVIDER_SITE_OTHER): Payer: Self-pay | Admitting: Bariatrics

## 2020-02-28 ENCOUNTER — Other Ambulatory Visit: Payer: Self-pay

## 2020-02-28 ENCOUNTER — Ambulatory Visit (INDEPENDENT_AMBULATORY_CARE_PROVIDER_SITE_OTHER): Payer: Managed Care, Other (non HMO) | Admitting: Bariatrics

## 2020-02-28 VITALS — BP 143/86 | HR 86 | Temp 99.1°F | Ht 66.0 in | Wt 223.0 lb

## 2020-02-28 DIAGNOSIS — E6609 Other obesity due to excess calories: Secondary | ICD-10-CM

## 2020-02-28 DIAGNOSIS — Z6836 Body mass index (BMI) 36.0-36.9, adult: Secondary | ICD-10-CM | POA: Diagnosis not present

## 2020-02-28 DIAGNOSIS — E559 Vitamin D deficiency, unspecified: Secondary | ICD-10-CM

## 2020-02-28 DIAGNOSIS — Z9189 Other specified personal risk factors, not elsewhere classified: Secondary | ICD-10-CM

## 2020-02-28 DIAGNOSIS — E66812 Obesity, class 2: Secondary | ICD-10-CM

## 2020-02-28 DIAGNOSIS — I10 Essential (primary) hypertension: Secondary | ICD-10-CM | POA: Diagnosis not present

## 2020-03-01 ENCOUNTER — Other Ambulatory Visit: Payer: Self-pay

## 2020-03-01 ENCOUNTER — Other Ambulatory Visit: Payer: Self-pay | Admitting: Unknown Physician Specialty

## 2020-03-01 ENCOUNTER — Ambulatory Visit
Admission: RE | Admit: 2020-03-01 | Discharge: 2020-03-01 | Disposition: A | Discharge status: Home / Self Care | Payer: Managed Care, Other (non HMO) | Source: Ambulatory Visit | Attending: Unknown Physician Specialty | Admitting: Unknown Physician Specialty

## 2020-03-01 DIAGNOSIS — R221 Localized swelling, mass and lump, neck: Secondary | ICD-10-CM | POA: Insufficient documentation

## 2020-03-01 MED ORDER — IOHEXOL 300 MG/ML  SOLN
75.0000 mL | Freq: Once | INTRAMUSCULAR | Status: AC | PRN
  Administered 2020-03-01: 75 mL via INTRAVENOUS

## 2020-03-01 NOTE — Progress Notes (Signed)
Chief Complaint:   OBESITY Ruth Elliott is here to discuss her progress with her obesity treatment plan along with follow-up of her obesity related diagnoses. Ruth Elliott is on the Category 3 Plan and states she is following her eating plan approximately 80% of the time. Ruth Elliott states she is walking up and down the stairs.  Today's visit was #: 12 Starting weight: 223 lbs Starting date: 08/30/2019 Today's weight: 223 lbs Today's date: 02/28/2020 Total lbs lost to date: 0 Total lbs lost since last in-office visit: 0  Interim History: Ruth Elliott is up 1 pound since her last visit.  Also, she is not feeling well today.  She has been more stressed.  Subjective:   1. Vitamin D insufficiency Ruth Elliott's Vitamin D level was 37.1 on 1/31/2022m which is down from 48.  She has not been taking her vitamin D regularly. She is currently taking OTC vitamin D 1,000 IU occasionally. She denies nausea, vomiting or muscle weakness.  2. Essential hypertension Review: taking medications as instructed, no medication side effects noted, no chest pain on exertion, no dyspnea on exertion, no swelling of ankles.  She is taking Norvasc 5 mg daily.  BP Readings from Last 3 Encounters:  02/28/20 (!) 143/86  02/14/20 (!) 146/84  01/24/20 (!) 148/89   3. At risk for activity intolerance Ruth Elliott is at risk for activity intolerance due to weather changes and not being able to exercise as much.  Assessment/Plan:   1. Vitamin D insufficiency Low Vitamin D level contributes to fatigue and are associated with obesity, breast, and colon cancer. She agrees to take OTC Vitamin D @1 ,000 IU daily and will follow-up for routine testing of Vitamin D, at least 2-3 times per year to avoid over-replacement.  2. Essential hypertension Ruth Elliott is working on healthy weight loss and exercise to improve blood pressure control. We will watch for signs of hypotension as she continues her lifestyle modifications.  Continue medication.  3. At risk for  activity intolerance Ruth Elliott was given approximately 15 minutes of exercise intolerance counseling today. She is 50 y.o. female and has risk factors exercise intolerance including obesity. We discussed intensive lifestyle modifications today with an emphasis on specific weight loss instructions and strategies. Ruth Elliott will slowly increase activity as tolerated.  Repetitive spaced learning was employed today to elicit superior memory formation and behavioral change.  4. Class 2 severe obesity with serious comorbidity and body mass index (BMI) of 36.0 to 36.9 in adult, unspecified obesity type ([place])  Ruth Elliott is currently in the action stage of change. As such, her goal is to continue with weight loss efforts. She has agreed to the Category 3 Plan.   She will work on meal planning, intentional eating, and increasing her water intake.  Labs from 02/14/2020, including CMP, lipid panel, and vitamin D, were reviewed.  Exercise goals: She will continue getting back to the gym.  Behavioral modification strategies: increasing lean protein intake, decreasing simple carbohydrates, increasing vegetables, increasing water intake, decreasing eating out, no skipping meals, meal planning and cooking strategies, keeping healthy foods in the home and planning for success.  Ruth Elliott has agreed to follow-up with our clinic in 2-3 weeks. She was informed of the importance of frequent follow-up visits to maximize her success with intensive lifestyle modifications for her multiple health conditions.   Objective:   Blood pressure (!) 143/86, pulse 86, temperature 99.1 F (37.3 C), height 5\' 6"  (1.676 m), weight 223 lb (101.2 kg), SpO2 98 %. Body mass index is  35.99 kg/m.  General: Cooperative, alert, well developed, in no acute distress. HEENT: Conjunctivae and lids unremarkable. Cardiovascular: Regular rhythm.  Lungs: Normal work of breathing. Neurologic: No focal deficits.   Lab Results  Component Value Date    CREATININE 0.63 02/14/2020   BUN 12 02/14/2020   NA 140 02/14/2020   K 4.1 02/14/2020   CL 103 02/14/2020   CO2 23 02/14/2020   Lab Results  Component Value Date   ALT 19 02/14/2020   AST 22 02/14/2020   ALKPHOS 62 02/14/2020   BILITOT 0.6 02/14/2020   Lab Results  Component Value Date   HGBA1C 5.5 08/09/2019   HGBA1C 5.3 09/28/2018   HGBA1C 5.4 08/18/2017   HGBA1C 5.1 08/19/2016   HGBA1C 5.0 07/13/2015   Lab Results  Component Value Date   INSULIN 16.2 02/14/2020   INSULIN 12.7 08/30/2019   Lab Results  Component Value Date   TSH 1.420 09/28/2018   Lab Results  Component Value Date   CHOL 112 02/14/2020   HDL 42 02/14/2020   LDLCALC 55 02/14/2020   TRIG 69 02/14/2020   CHOLHDL 2.5 08/09/2019   Lab Results  Component Value Date   WBC 6.8 08/09/2019   HGB 15.1 08/09/2019   HCT 42.3 08/09/2019   MCV 96 08/09/2019   PLT 172 08/09/2019   Attestation Statements:   Reviewed by clinician on day of visit: allergies, medications, problem list, medical history, surgical history, family history, social history, and previous encounter notes.  I, Water quality scientist, CMA, am acting as Location manager for [employer], DO  I have reviewed the above documentation for accuracy and completeness, and I agree with the above. Jearld Lesch, DO

## 2020-03-05 ENCOUNTER — Encounter (INDEPENDENT_AMBULATORY_CARE_PROVIDER_SITE_OTHER): Payer: Self-pay | Admitting: Bariatrics

## 2020-03-09 ENCOUNTER — Other Ambulatory Visit: Payer: Self-pay | Admitting: Unknown Physician Specialty

## 2020-03-09 ENCOUNTER — Telehealth: Payer: Self-pay

## 2020-03-09 DIAGNOSIS — R221 Localized swelling, mass and lump, neck: Secondary | ICD-10-CM

## 2020-03-09 LAB — ANATOMIC PATHOLOGY REPORT

## 2020-03-09 NOTE — Telephone Encounter (Signed)
Patient's biopsy results from 02/01/20 never came over due to [facility] having the wrong account number attached. The results were found and placed in patient's chart today.

## 2020-03-09 NOTE — Telephone Encounter (Signed)
-----   Message from Ralene Bathe, MD sent at 03/09/2020  5:20 PM EST ----- Benign cyst of the Left low back excised

## 2020-03-09 NOTE — Telephone Encounter (Signed)
Advised patient of results.  

## 2020-03-14 ENCOUNTER — Ambulatory Visit
Admission: RE | Admit: 2020-03-14 | Discharge: 2020-03-14 | Disposition: A | Discharge status: Home / Self Care | Payer: Managed Care, Other (non HMO) | Source: Ambulatory Visit | Attending: Unknown Physician Specialty | Admitting: Unknown Physician Specialty

## 2020-03-14 ENCOUNTER — Telehealth: Payer: Self-pay

## 2020-03-14 ENCOUNTER — Other Ambulatory Visit: Payer: Self-pay

## 2020-03-14 DIAGNOSIS — R221 Localized swelling, mass and lump, neck: Secondary | ICD-10-CM | POA: Diagnosis not present

## 2020-03-14 NOTE — Telephone Encounter (Signed)
-----   Message from Ralene Bathe, MD sent at 03/13/2020  2:43 PM EST ----- Diagnosis synopsis: Comment  Comment: Specimen A-Skin Excision, left lower back: EPIDERMAL  INCLUSION CYST. NO EVIDENCE OF MALIGNANCY.  Benign cyst

## 2020-03-14 NOTE — Telephone Encounter (Signed)
Discussed biopsy results with pt  °

## 2020-03-20 ENCOUNTER — Ambulatory Visit (INDEPENDENT_AMBULATORY_CARE_PROVIDER_SITE_OTHER): Payer: Managed Care, Other (non HMO) | Admitting: Bariatrics

## 2020-03-20 ENCOUNTER — Encounter (INDEPENDENT_AMBULATORY_CARE_PROVIDER_SITE_OTHER): Payer: Self-pay | Admitting: Bariatrics

## 2020-03-20 ENCOUNTER — Other Ambulatory Visit: Payer: Self-pay

## 2020-03-20 VITALS — BP 145/86 | HR 78 | Temp 98.2°F | Ht 66.0 in | Wt 224.0 lb

## 2020-03-20 DIAGNOSIS — I10 Essential (primary) hypertension: Secondary | ICD-10-CM

## 2020-03-20 DIAGNOSIS — E559 Vitamin D deficiency, unspecified: Secondary | ICD-10-CM | POA: Diagnosis not present

## 2020-03-20 DIAGNOSIS — Z6836 Body mass index (BMI) 36.0-36.9, adult: Secondary | ICD-10-CM | POA: Diagnosis not present

## 2020-03-21 ENCOUNTER — Encounter (INDEPENDENT_AMBULATORY_CARE_PROVIDER_SITE_OTHER): Payer: Self-pay | Admitting: Bariatrics

## 2020-03-21 NOTE — Progress Notes (Signed)
Chief Complaint:   OBESITY Ruth Elliott is here to discuss her progress with her obesity treatment plan along with follow-up of her obesity related diagnoses. Ruth Elliott is on the Category 3 Plan and states she is following her eating plan approximately 90% of the time. Ruth Elliott states she is walking for 40-50 minutes 5-6 times per week.  Today's visit was #: 56 Starting weight: 223 lbs Starting date: 08/30/2019 Today's weight: 224 lbs Today's date: 03/20/2020 Total lbs lost to date: 0 Total lbs lost since last in-office visit: 0  Interim History: Ruth Elliott is up 1 pound since her last visit.  She is doing well with her water.  Subjective:   1. Vitamin D insufficiency Ruth Elliott's Vitamin D level was 37.1 on 02/14/2020. She is currently taking OTC vitamin D 1,000 IU each day. She denies nausea, vomiting or muscle weakness.  2. Essential hypertension Controlled.  Review: taking medications as instructed, no medication side effects noted, no chest pain on exertion, no dyspnea on exertion, no swelling of ankles.   BP Readings from Last 3 Encounters:  03/20/20 (!) 145/86  02/28/20 (!) 143/86  02/14/20 (!) 146/84   Assessment/Plan:   1. Vitamin D insufficiency Low Vitamin D level contributes to fatigue and are associated with obesity, breast, and colon cancer. She agrees to continue to take prescription Vitamin D @50 ,000 IU every week and will follow-up for routine testing of Vitamin D, at least 2-3 times per year to avoid over-replacement.  2. Essential hypertension Ruth Elliott is working on healthy weight loss and exercise to improve blood pressure control. We will watch for signs of hypotension as she continues her lifestyle modifications.  Continue medication.  3. Class 2 severe obesity with serious comorbidity and body mass index (BMI) of 36.0 to 36.9 in adult, unspecified obesity type (Ruth Elliott)  Ruth Elliott is currently in the action stage of change. As such, her goal is to continue with weight loss efforts. She  has agreed to the Category 3 Plan.   She will work on meal planning, intentional eating, and increasing water intake.  Exercise goals: Walking and walking the dog on a regular basis.  Behavioral modification strategies: increasing lean protein intake, decreasing simple carbohydrates, increasing vegetables, increasing water intake, decreasing eating out, no skipping meals, meal planning and cooking strategies, keeping healthy foods in the home and planning for success.  Ruth Elliott has agreed to follow-up with our clinic in 3 weeks. She was informed of the importance of frequent follow-up visits to maximize her success with intensive lifestyle modifications for her multiple health conditions.   Objective:   Blood pressure (!) 145/86, pulse 78, temperature 98.2 F (36.8 C), height 5\' 6"  (1.676 m), weight 224 lb (101.6 kg), SpO2 98 %. Body mass index is 36.15 kg/m.  General: Cooperative, alert, well developed, in no acute distress. HEENT: Conjunctivae and lids unremarkable. Cardiovascular: Regular rhythm.  Lungs: Normal work of breathing. Neurologic: No focal deficits.   Lab Results  Component Value Date   CREATININE 0.63 02/14/2020   BUN 12 02/14/2020   NA 140 02/14/2020   K 4.1 02/14/2020   CL 103 02/14/2020   CO2 23 02/14/2020   Lab Results  Component Value Date   ALT 19 02/14/2020   AST 22 02/14/2020   ALKPHOS 62 02/14/2020   BILITOT 0.6 02/14/2020   Lab Results  Component Value Date   HGBA1C 5.5 08/09/2019   HGBA1C 5.3 09/28/2018   HGBA1C 5.4 08/18/2017   HGBA1C 5.1 08/19/2016   HGBA1C 5.0  07/13/2015   Lab Results  Component Value Date   INSULIN 16.2 02/14/2020   INSULIN 12.7 08/30/2019   Lab Results  Component Value Date   TSH 1.420 09/28/2018   Lab Results  Component Value Date   CHOL 112 02/14/2020   HDL 42 02/14/2020   LDLCALC 55 02/14/2020   TRIG 69 02/14/2020   CHOLHDL 2.5 08/09/2019   Lab Results  Component Value Date   WBC 6.8 08/09/2019   HGB  15.1 08/09/2019   HCT 42.3 08/09/2019   MCV 96 08/09/2019   PLT 172 08/09/2019   Attestation Statements:   Reviewed by clinician on day of visit: allergies, medications, problem list, medical history, surgical history, family history, social history, and previous encounter notes.  Time spent on visit including pre-visit chart review and post-visit care and charting was 20 minutes.   I, Water quality scientist, CMA, am acting as Location manager for [employer], DO  I have reviewed the above documentation for accuracy and completeness, and I agree with the above. Jearld Lesch, DO

## 2020-03-31 ENCOUNTER — Ambulatory Visit: Payer: Managed Care, Other (non HMO) | Admitting: Cardiovascular Disease

## 2020-04-10 ENCOUNTER — Other Ambulatory Visit: Payer: Self-pay

## 2020-04-10 ENCOUNTER — Ambulatory Visit (INDEPENDENT_AMBULATORY_CARE_PROVIDER_SITE_OTHER): Payer: Managed Care, Other (non HMO) | Admitting: Bariatrics

## 2020-04-10 ENCOUNTER — Encounter (INDEPENDENT_AMBULATORY_CARE_PROVIDER_SITE_OTHER): Payer: Self-pay | Admitting: Bariatrics

## 2020-04-10 VITALS — BP 146/86 | HR 69 | Temp 98.6°F | Ht 66.0 in | Wt 226.0 lb

## 2020-04-10 DIAGNOSIS — Z6836 Body mass index (BMI) 36.0-36.9, adult: Secondary | ICD-10-CM

## 2020-04-10 DIAGNOSIS — E559 Vitamin D deficiency, unspecified: Secondary | ICD-10-CM

## 2020-04-10 DIAGNOSIS — I10 Essential (primary) hypertension: Secondary | ICD-10-CM | POA: Diagnosis not present

## 2020-04-11 ENCOUNTER — Encounter (INDEPENDENT_AMBULATORY_CARE_PROVIDER_SITE_OTHER): Payer: Self-pay | Admitting: Bariatrics

## 2020-04-11 NOTE — Progress Notes (Signed)
Chief Complaint:   OBESITY Ruth Elliott is here to discuss her progress with her obesity treatment plan along with follow-up of her obesity related diagnoses. Ruth Elliott is on the Category 3 Plan and states she is following her eating plan approximately 40% of the time. Ruth Elliott states she is riding horses and walking dog 20-30 minutes 3-4 times per week.  Today's visit was #: 14 Starting weight: 223 lbs Starting date: 08/30/2019 Today's weight: 226 lbs Today's date: 04/10/2020 Total lbs lost to date: 0 Total lbs lost since last in-office visit: 0  Interim History: Ruth Elliott is up 2 lbs since her last visit. She is getting more water. She is taking 1 meal to work.  Subjective:   1. Vitamin D insufficiency Ruth Elliott's Vitamin D level was 37.1 on 02/14/2020. She is currently taking OTC vitamin D 1,000 units each day.   2. Essential hypertension Ruth Elliott's BP is reasonably well controlled.  BP Readings from Last 3 Encounters:  04/10/20 (!) 146/86  03/20/20 (!) 145/86  02/28/20 (!) 143/86    Assessment/Plan:   1. Vitamin D insufficiency Low Vitamin D level contributes to fatigue and are associated with obesity, breast, and colon cancer. She agrees to continue to take OTC Vitamin D @1 ,000 IU daily and will follow-up for routine testing of Vitamin D, at least 2-3 times per year to avoid over-replacement. Ruth Elliott will get limited sun exposure.  2. Essential hypertension Ruth Elliott is working on healthy weight loss and exercise to improve blood pressure control. We will watch for signs of hypotension as she continues her lifestyle modifications. No added salt. Continue medications.  3. obesity current bmi 36.49 Ruth Elliott is currently in the action stage of change. As such, her goal is to continue with weight loss efforts. She has agreed to the Category 3 Plan.   1. Meal plan 2. Will adhere more closely to plan 3. Will take lunch  Exercise goals: Ruth Elliott is not getting much ridign time and walking  Behavioral  modification strategies: increasing lean protein intake, decreasing simple carbohydrates, increasing vegetables, increasing water intake, decreasing eating out, no skipping meals, meal planning and cooking strategies, keeping healthy foods in the home and planning for success.  Ruth Elliott has agreed to follow-up with our clinic in 2 weeks. She was informed of the importance of frequent follow-up visits to maximize her success with intensive lifestyle modifications for her multiple health conditions.   Objective:   Blood pressure (!) 146/86, pulse 69, temperature 98.6 F (37 C), height 5\' 6"  (1.676 m), weight 226 lb (102.5 kg), SpO2 98 %. Body mass index is 36.48 kg/m.  General: Cooperative, alert, well developed, in no acute distress. HEENT: Conjunctivae and lids unremarkable. Cardiovascular: Regular rhythm.  Lungs: Normal work of breathing. Neurologic: No focal deficits.   Lab Results  Component Value Date   CREATININE 0.63 02/14/2020   BUN 12 02/14/2020   NA 140 02/14/2020   K 4.1 02/14/2020   CL 103 02/14/2020   CO2 23 02/14/2020   Lab Results  Component Value Date   ALT 19 02/14/2020   AST 22 02/14/2020   ALKPHOS 62 02/14/2020   BILITOT 0.6 02/14/2020   Lab Results  Component Value Date   HGBA1C 5.5 08/09/2019   HGBA1C 5.3 09/28/2018   HGBA1C 5.4 08/18/2017   HGBA1C 5.1 08/19/2016   HGBA1C 5.0 07/13/2015   Lab Results  Component Value Date   INSULIN 16.2 02/14/2020   INSULIN 12.7 08/30/2019   Lab Results  Component Value Date  TSH 1.420 09/28/2018   Lab Results  Component Value Date   CHOL 112 02/14/2020   HDL 42 02/14/2020   LDLCALC 55 02/14/2020   TRIG 69 02/14/2020   CHOLHDL 2.5 08/09/2019   Lab Results  Component Value Date   WBC 6.8 08/09/2019   HGB 15.1 08/09/2019   HCT 42.3 08/09/2019   MCV 96 08/09/2019   PLT 172 08/09/2019    Attestation Statements:   Reviewed by clinician on day of visit: allergies, medications, problem list, medical  history, surgical history, family history, social history, and previous encounter notes.  Time spent on visit including pre-visit chart review and post-visit care and charting was 20 minutes.   Coral Ceo, am acting as Location manager for [employer], DO.  I have reviewed the above documentation for accuracy and completeness, and I agree with the above. Jearld Lesch, DO

## 2020-04-24 ENCOUNTER — Ambulatory Visit (INDEPENDENT_AMBULATORY_CARE_PROVIDER_SITE_OTHER): Payer: Managed Care, Other (non HMO) | Admitting: Bariatrics

## 2020-04-24 ENCOUNTER — Other Ambulatory Visit: Payer: Self-pay

## 2020-04-24 ENCOUNTER — Encounter (INDEPENDENT_AMBULATORY_CARE_PROVIDER_SITE_OTHER): Payer: Self-pay | Admitting: Bariatrics

## 2020-04-24 VITALS — BP 146/86 | HR 77 | Temp 98.1°F | Ht 66.0 in | Wt 226.0 lb

## 2020-04-24 DIAGNOSIS — E559 Vitamin D deficiency, unspecified: Secondary | ICD-10-CM

## 2020-04-24 DIAGNOSIS — Z6836 Body mass index (BMI) 36.0-36.9, adult: Secondary | ICD-10-CM

## 2020-04-24 DIAGNOSIS — I10 Essential (primary) hypertension: Secondary | ICD-10-CM | POA: Diagnosis not present

## 2020-04-27 ENCOUNTER — Encounter (INDEPENDENT_AMBULATORY_CARE_PROVIDER_SITE_OTHER): Payer: Self-pay | Admitting: Bariatrics

## 2020-04-27 NOTE — Progress Notes (Signed)
Chief Complaint:   OBESITY Ruth Elliott is here to discuss her progress with her obesity treatment plan along with follow-up of her obesity related diagnoses. Ruth Elliott is on the Category 3 Plan and states she is following her eating plan approximately 60% of the time. Ruth Elliott states she is kickboxing for 20 minutes 3 times per week and walking for 20 minutes 3 times per week.  Today's visit was #: 15 Starting weight: 223 lbs Starting date: 08/30/2019 Today's weight: 226 lbs Today's date: 04/24/2020 Total lbs lost to date: 0 Total lbs lost since last in-office visit: 0  Interim History: Ruth Elliott's weight remains the same.  She has not been able to drink water.  She is skipping some meals.  Subjective:   1. Essential hypertension Review: taking medications as instructed, no medication side effects noted, no chest pain on exertion, no dyspnea on exertion, no swelling of ankles.  Taking Norvasc.  BP Readings from Last 3 Encounters:  04/24/20 (!) 146/86  04/10/20 (!) 146/86  03/20/20 (!) 145/86   2. Vitamin D insufficiency Ruth Elliott's Vitamin D level was 37.1 on 02/14/2020. She is currently taking OTC vitamin D. She denies nausea, vomiting or muscle weakness.  Assessment/Plan:   1. Essential hypertension Ruth Elliott is working on healthy weight loss and exercise to improve blood pressure control. We will watch for signs of hypotension as she continues her lifestyle modifications.  Continue medication.  2. Vitamin D insufficiency Low Vitamin D level contributes to fatigue and are associated with obesity, breast, and colon cancer. She agrees to continue to take OTC vitamin D daily and will follow-up for routine testing of Vitamin D, at least 2-3 times per year to avoid over-replacement.  3. Obesity, current BMI 36  Ruth Elliott is currently in the action stage of change. As such, her goal is to continue with weight loss efforts. She has agreed to the Category 3 Plan.   She will work on meal planning, mindful  eating, will adhere more strictly to the plan, and will get ice packets for lunch.  Recipes II handout provided today.  Exercise goals: More active, walking.  Behavioral modification strategies: increasing lean protein intake, decreasing simple carbohydrates, increasing vegetables, increasing water intake, decreasing eating out, no skipping meals, meal planning and cooking strategies, keeping healthy foods in the home and planning for success.  Ruth Elliott has agreed to follow-up with our clinic in 2-3 weeks, fasting. She was informed of the importance of frequent follow-up visits to maximize her success with intensive lifestyle modifications for her multiple health conditions.   Objective:   Blood pressure (!) 146/86, pulse 77, temperature 98.1 F (36.7 C), height 5\' 6"  (1.676 m), weight 226 lb (102.5 kg), SpO2 98 %. Body mass index is 36.48 kg/m.  General: Cooperative, alert, well developed, in no acute distress. HEENT: Conjunctivae and lids unremarkable. Cardiovascular: Regular rhythm.  Lungs: Normal work of breathing. Neurologic: No focal deficits.   Lab Results  Component Value Date   CREATININE 0.63 02/14/2020   BUN 12 02/14/2020   NA 140 02/14/2020   K 4.1 02/14/2020   CL 103 02/14/2020   CO2 23 02/14/2020   Lab Results  Component Value Date   ALT 19 02/14/2020   AST 22 02/14/2020   ALKPHOS 62 02/14/2020   BILITOT 0.6 02/14/2020   Lab Results  Component Value Date   HGBA1C 5.5 08/09/2019   HGBA1C 5.3 09/28/2018   HGBA1C 5.4 08/18/2017   HGBA1C 5.1 08/19/2016   HGBA1C 5.0 07/13/2015  Lab Results  Component Value Date   INSULIN 16.2 02/14/2020   INSULIN 12.7 08/30/2019   Lab Results  Component Value Date   TSH 1.420 09/28/2018   Lab Results  Component Value Date   CHOL 112 02/14/2020   HDL 42 02/14/2020   LDLCALC 55 02/14/2020   TRIG 69 02/14/2020   CHOLHDL 2.5 08/09/2019   Lab Results  Component Value Date   WBC 6.8 08/09/2019   HGB 15.1 08/09/2019    HCT 42.3 08/09/2019   MCV 96 08/09/2019   PLT 172 08/09/2019   Attestation Statements:   Reviewed by clinician on day of visit: allergies, medications, problem list, medical history, surgical history, family history, social history, and previous encounter notes.  Time spent on visit including pre-visit chart review and post-visit care and charting was 20 minutes.   I, Water quality scientist, CMA, am acting as Location manager for [employer], DO  I have reviewed the above documentation for accuracy and completeness, and I agree with the above. Jearld Lesch, DO

## 2020-05-15 ENCOUNTER — Other Ambulatory Visit: Payer: Self-pay

## 2020-05-15 ENCOUNTER — Ambulatory Visit: Payer: Managed Care, Other (non HMO) | Admitting: Dermatology

## 2020-05-15 DIAGNOSIS — D2371 Other benign neoplasm of skin of right lower limb, including hip: Secondary | ICD-10-CM

## 2020-05-15 DIAGNOSIS — Z1283 Encounter for screening for malignant neoplasm of skin: Secondary | ICD-10-CM

## 2020-05-15 DIAGNOSIS — D1800 Hemangioma unspecified site: Secondary | ICD-10-CM

## 2020-05-15 DIAGNOSIS — L719 Rosacea, unspecified: Secondary | ICD-10-CM | POA: Diagnosis not present

## 2020-05-15 DIAGNOSIS — L814 Other melanin hyperpigmentation: Secondary | ICD-10-CM

## 2020-05-15 DIAGNOSIS — L259 Unspecified contact dermatitis, unspecified cause: Secondary | ICD-10-CM | POA: Diagnosis not present

## 2020-05-15 DIAGNOSIS — D229 Melanocytic nevi, unspecified: Secondary | ICD-10-CM

## 2020-05-15 DIAGNOSIS — L578 Other skin changes due to chronic exposure to nonionizing radiation: Secondary | ICD-10-CM

## 2020-05-15 DIAGNOSIS — L821 Other seborrheic keratosis: Secondary | ICD-10-CM

## 2020-05-15 DIAGNOSIS — Z872 Personal history of diseases of the skin and subcutaneous tissue: Secondary | ICD-10-CM

## 2020-05-15 NOTE — Patient Instructions (Addendum)

## 2020-05-15 NOTE — Progress Notes (Signed)
   Follow-Up Visit   Subjective  Ruth Elliott is a 50 y.o. female who presents for the following: Follow-up (Patient here today for tbse. She states she has some dryness on forehead above right eyebrow she would like checked. She has history of epidermal cyst. ). Patient here for full body skin exam and skin cancer screening.  The following portions of the chart were reviewed this encounter and updated as appropriate:  Tobacco  Allergies  Meds  Problems  Med Hx  Surg Hx  Fam Hx      Objective  Well appearing patient in no apparent distress; mood and affect are within normal limits.  A full examination was performed including scalp, head, eyes, ears, nose, lips, neck, chest, axillae, abdomen, back, buttocks, bilateral upper extremities, bilateral lower extremities, hands, feet, fingers, toes, fingernails, and toenails. All findings within normal limits unless otherwise noted below.  Objective  Right Malar Cheek: Mid face erythema with telangiectasias +/- scattered inflammatory papules.   Assessment & Plan  Rosacea  Cheeks Rosacea is a chronic progressive skin condition usually affecting the face of adults, causing redness and/or acne bumps. It is treatable but not curable. It sometimes affects the eyes (ocular rosacea) as well. It may respond to topical and/or systemic medication and can flare with stress, sun exposure, alcohol, exercise and some foods.  Daily application of broad spectrum spf 30+ sunscreen to face is recommended to reduce flares.  Currently no treatment   Patient will notify us in future if bothered by Rosacea - will prescribe skin medicinals triple cream treatment  Contact irritant dermatitis, unspecified contact dermatitis type, unspecified trigger Right Forehead Promiseb topical cream samples given in office today  [phone] 2022-04  Instructed to use cream to affected area of forehead daily.  Notify us if unresolved.    Lentigines - Scattered tan  macules - Due to sun exposure - Benign-appering, observe - Recommend daily broad spectrum sunscreen SPF 30+ to sun-exposed areas, reapply every 2 hours as needed. - Call for any changes  Seborrheic Keratoses - Stuck-on, waxy, tan-brown papules and/or plaques  - Benign-appearing - Discussed benign etiology and prognosis. - Observe - Call for any changes  Melanocytic Nevi - Tan-brown and/or pink-flesh-colored symmetric macules and papules - Benign appearing on exam today - Observation - Call clinic for new or changing moles - Recommend daily use of broad spectrum spf 30+ sunscreen to sun-exposed areas.   Hemangiomas - Red papules - Discussed benign nature - Observe - Call for any changes  Dermatofibroma - Firm pink/brown papulenodule with dimple sign right thigh  - Benign appearing - Call for any changes  Actinic Damage - Chronic condition, secondary to cumulative UV/sun exposure - diffuse scaly erythematous macules with underlying dyspigmentation - Recommend daily broad spectrum sunscreen SPF 30+ to sun-exposed areas, reapply every 2 hours as needed.  - Staying in the shade or wearing long sleeves, sun glasses (UVA+UVB protection) and wide brim hats (4-inch brim around the entire circumference of the hat) are also recommended for sun protection.  - Call for new or changing lesions.  Skin cancer screening performed today.  Return in about 1 year (around 05/15/2021) for tbse.  IRuthell Rummage, CMA, am acting as scribe for Sarina Ser, MD.  Documentation: I have reviewed the above documentation for accuracy and completeness, and I agree with the above.  Sarina Ser, MD

## 2020-05-16 ENCOUNTER — Encounter: Payer: Self-pay | Admitting: Dermatology

## 2020-05-22 ENCOUNTER — Encounter (INDEPENDENT_AMBULATORY_CARE_PROVIDER_SITE_OTHER): Payer: Self-pay | Admitting: Bariatrics

## 2020-05-22 ENCOUNTER — Other Ambulatory Visit: Payer: Self-pay

## 2020-05-22 ENCOUNTER — Ambulatory Visit (INDEPENDENT_AMBULATORY_CARE_PROVIDER_SITE_OTHER): Payer: Managed Care, Other (non HMO) | Admitting: Bariatrics

## 2020-05-22 VITALS — BP 146/86 | HR 69 | Temp 98.0°F | Ht 66.0 in | Wt 227.0 lb

## 2020-05-22 DIAGNOSIS — I10 Essential (primary) hypertension: Secondary | ICD-10-CM

## 2020-05-22 DIAGNOSIS — Z6836 Body mass index (BMI) 36.0-36.9, adult: Secondary | ICD-10-CM

## 2020-05-22 DIAGNOSIS — E559 Vitamin D deficiency, unspecified: Secondary | ICD-10-CM

## 2020-05-22 DIAGNOSIS — E8881 Metabolic syndrome: Secondary | ICD-10-CM | POA: Diagnosis not present

## 2020-05-22 DIAGNOSIS — Z9189 Other specified personal risk factors, not elsewhere classified: Secondary | ICD-10-CM | POA: Diagnosis not present

## 2020-05-22 MED ORDER — AMLODIPINE BESYLATE 5 MG PO TABS: ORAL_TABLET | ORAL | 0 refills | Status: DC

## 2020-05-23 LAB — COMPREHENSIVE METABOLIC PANEL
ALT: 17 IU/L (ref 0–32)
AST: 21 IU/L (ref 0–40)
Albumin/Globulin Ratio: 1.8 (ref 1.2–2.2)
Albumin: 4.4 g/dL (ref 3.8–4.8)
Alkaline Phosphatase: 62 IU/L (ref 44–121)
BUN/Creatinine Ratio: 15 (ref 9–23)
BUN: 9 mg/dL (ref 6–24)
Bilirubin Total: 0.5 mg/dL (ref 0.0–1.2)
CO2: 23 mmol/L (ref 20–29)
Calcium: 8.9 mg/dL (ref 8.7–10.2)
Chloride: 102 mmol/L (ref 96–106)
Creatinine, Ser: 0.61 mg/dL (ref 0.57–1.00)
Globulin, Total: 2.4 g/dL (ref 1.5–4.5)
Glucose: 91 mg/dL (ref 65–99)
Potassium: 4 mmol/L (ref 3.5–5.2)
Sodium: 139 mmol/L (ref 134–144)
Total Protein: 6.8 g/dL (ref 6.0–8.5)
eGFR: 109 mL/min/{1.73_m2} (ref >59)

## 2020-05-23 LAB — VITAMIN D 25 HYDROXY (VIT D DEFICIENCY, FRACTURES): Vit D, 25-Hydroxy: 47.5 ng/mL (ref 30.0–100.0)

## 2020-05-23 LAB — HEMOGLOBIN A1C
Est. average glucose Bld gHb Est-mCnc: 114 mg/dL
Hgb A1c MFr Bld: 5.6 % (ref 4.8–5.6)

## 2020-05-23 LAB — INSULIN, RANDOM: INSULIN: 20.8 u[IU]/mL (ref 2.6–24.9)

## 2020-05-23 NOTE — Progress Notes (Signed)
Chief Complaint:   OBESITY Ruth Elliott is here to discuss her progress with her obesity treatment plan along with follow-up of her obesity related diagnoses. Ruth Elliott is on the Category 3 Plan and states she is following her eating plan approximately 75% of the time. Ruth Elliott states she is riding horses and walking the dogs for 50 minutes 5 times per week.  Today's visit was #: 73 Starting weight: 223 lbs Starting date: 08/30/2019 Today's weight: 227 lbs Today's date: 05/22/2020 Total lbs lost to date: 0 Total lbs lost since last in-office visit: 0  Interim History: Ruth Elliott is down 1 lb. Her appetite is ok and she is taking probiotic (decreased bloating). She is not cravings chocolate.  Subjective:   1. Essential hypertension Ruth Elliott's blood pressure is relatively controlled.  2. Vitamin D insufficiency Ruth Elliott is taking OTC Vit D currently. Last Vit D level was 37.1.  3. Insulin resistance Ruth Elliott's last insulin level was 16.2, and last A1c was 5.5.  4. At risk for heart disease Ruth Elliott is at a higher than average risk for cardiovascular disease due to obesity and hypertension.   Assessment/Plan:   1. Essential hypertension Ruth Elliott is working on healthy weight loss and exercise to improve blood pressure control. We will watch for signs of hypotension as she continues her lifestyle modifications. We will check labs today, and we will refill amlodipine for 90 days with no refills.  - amLODipine (NORVASC) 5 MG tablet; TAKE 1 TABLET(5 MG) BY MOUTH DAILY  Dispense: 90 tablet; Refill: 0 - Comprehensive metabolic panel  2. Vitamin D insufficiency Low Vitamin D level contributes to fatigue and are associated with obesity, breast, and colon cancer. Ruth Elliott agreed to continue taking Vitamin D, and we will recheck labs today. She will follow-up for routine testing of Vitamin D, at least 2-3 times per year to avoid over-replacement.  - VITAMIN D 25 Hydroxy (Vit-D Deficiency, Fractures)  3. Insulin  resistance Ruth Elliott will continue to work on weight loss, exercise, and decreasing simple carbohydrates to help decrease the risk of diabetes. We will recheck labs today. Ruth Elliott agreed to follow-up with [name] as directed to closely monitor her progress.  - Hemoglobin A1c - Insulin, random  4. At risk for heart disease Ruth Elliott was given approximately 15 minutes of coronary artery disease prevention counseling today. She is 50 y.o. female and has risk factors for heart disease including obesity. We discussed intensive lifestyle modifications today with an emphasis on specific weight loss instructions and strategies.   Repetitive spaced learning was employed today to elicit superior memory formation and behavioral change.  5. Obesity, current BMI 36 Ruth Elliott is currently in the action stage of change. As such, her goal is to continue with weight loss efforts. She has agreed to the Category 2 Plan.   Intentional eating was discussed. Ruth Elliott will take lunch with her to work.  Exercise goals: As is.  Behavioral modification strategies: increasing lean protein intake, decreasing simple carbohydrates, increasing vegetables, increasing water intake, decreasing eating out, no skipping meals, meal planning and cooking strategies, keeping healthy foods in the home and planning for success.  Ruth Elliott has agreed to follow-up with our clinic in 2 to 3 weeks. She was informed of the importance of frequent follow-up visits to maximize her success with intensive lifestyle modifications for her multiple health conditions.   Ruth Elliott was informed we would discuss her lab results at her next visit unless there is a critical issue that needs to be addressed sooner. Ruth Elliott agreed  to keep her next visit at the agreed upon time to discuss these results.  Objective:   Blood pressure (!) 146/86, pulse 69, temperature 98 F (36.7 C), height 5\' 6"  (1.676 m), weight 227 lb (103 kg), SpO2 98 %. Body mass index is 36.64 kg/m.  General:  Cooperative, alert, well developed, in no acute distress. HEENT: Conjunctivae and lids unremarkable. Cardiovascular: Regular rhythm.  Lungs: Normal work of breathing. Neurologic: No focal deficits.   Lab Results  Component Value Date   CREATININE 0.63 02/14/2020   BUN 12 02/14/2020   NA 140 02/14/2020   K 4.1 02/14/2020   CL 103 02/14/2020   CO2 23 02/14/2020   Lab Results  Component Value Date   ALT 19 02/14/2020   AST 22 02/14/2020   ALKPHOS 62 02/14/2020   BILITOT 0.6 02/14/2020   Lab Results  Component Value Date   HGBA1C 5.5 08/09/2019   HGBA1C 5.3 09/28/2018   HGBA1C 5.4 08/18/2017   HGBA1C 5.1 08/19/2016   HGBA1C 5.0 07/13/2015   Lab Results  Component Value Date   INSULIN 16.2 02/14/2020   INSULIN 12.7 08/30/2019   Lab Results  Component Value Date   TSH 1.420 09/28/2018   Lab Results  Component Value Date   CHOL 112 02/14/2020   HDL 42 02/14/2020   LDLCALC 55 02/14/2020   TRIG 69 02/14/2020   CHOLHDL 2.5 08/09/2019   Lab Results  Component Value Date   WBC 6.8 08/09/2019   HGB 15.1 08/09/2019   HCT 42.3 08/09/2019   MCV 96 08/09/2019   PLT 172 08/09/2019   No results found for: IRON, TIBC, FERRITIN  Attestation Statements:   Reviewed by clinician on day of visit: allergies, medications, problem list, medical history, surgical history, family history, social history, and previous encounter notes.   Wilhemena Durie, am acting as Location manager for [employer], DO.  I have reviewed the above documentation for accuracy and completeness, and I agree with the above. Jearld Lesch, DO

## 2020-05-24 ENCOUNTER — Encounter (INDEPENDENT_AMBULATORY_CARE_PROVIDER_SITE_OTHER): Payer: Self-pay | Admitting: Bariatrics

## 2020-06-05 ENCOUNTER — Other Ambulatory Visit: Payer: Self-pay

## 2020-06-05 ENCOUNTER — Ambulatory Visit (INDEPENDENT_AMBULATORY_CARE_PROVIDER_SITE_OTHER): Payer: Managed Care, Other (non HMO) | Admitting: Bariatrics

## 2020-06-05 ENCOUNTER — Encounter (INDEPENDENT_AMBULATORY_CARE_PROVIDER_SITE_OTHER): Payer: Self-pay | Admitting: Bariatrics

## 2020-06-05 VITALS — BP 146/90 | HR 70 | Temp 97.9°F | Ht 66.0 in | Wt 226.0 lb

## 2020-06-05 DIAGNOSIS — Z9189 Other specified personal risk factors, not elsewhere classified: Secondary | ICD-10-CM

## 2020-06-05 DIAGNOSIS — E8881 Metabolic syndrome: Secondary | ICD-10-CM | POA: Diagnosis not present

## 2020-06-05 DIAGNOSIS — I10 Essential (primary) hypertension: Secondary | ICD-10-CM | POA: Diagnosis not present

## 2020-06-05 DIAGNOSIS — Z6836 Body mass index (BMI) 36.0-36.9, adult: Secondary | ICD-10-CM

## 2020-06-05 MED ORDER — OZEMPIC (0.25 OR 0.5 MG/DOSE) 2 MG/1.5ML ~~LOC~~ SOPN: 0.2500 mg | PEN_INJECTOR | SUBCUTANEOUS | 0 refills | Status: DC

## 2020-06-07 NOTE — Progress Notes (Signed)
Chief Complaint:   OBESITY Ruth Elliott is here to discuss her progress with her obesity treatment plan along with follow-up of her obesity related diagnoses. Ruth Elliott is on the Category 3 Plan and states she is following her eating plan approximately 80% of the time. Ruth Elliott states she is riding horses 30 minutes 5 times per week.  Today's visit was #: 31 Starting weight: 223 lbs Starting date: 08/30/2019 Today's weight: 226 lbs Today's date: 06/05/2020 Total lbs lost to date: 0 Total lbs lost since last in-office visit: 1  Interim History: She is down 1 lb. She went to the category and had headaches.  Subjective:   1. Insulin resistance Ruth Elliott is not on medication.  2. Essential hypertension Ruth Elliott is taking Norvasc. Her BP is slightly elevated today.  BP Readings from Last 3 Encounters:  06/05/20 (!) 146/90  05/22/20 (!) 146/86  04/24/20 (!) 146/86   3. At risk for diabetes mellitus Ruth Elliott is at higher than average risk for developing diabetes due to obesity.   Assessment/Plan:   1. Insulin resistance Ruth Elliott will continue to work on weight loss, exercise, and decreasing simple carbohydrates to help decrease the risk of diabetes. Ruth Elliott agreed to follow-up with [name] as directed to closely monitor her progress. -Ozempic needs prior authorization - Semaglutide,0.25 or 0.5MG /DOS, (OZEMPIC, 0.25 OR 0.5 MG/DOSE,) 2 MG/1.5ML SOPN; Inject 0.25 mg into the skin once a week.  Dispense: 1.5 mL; Refill: 0  2. Essential hypertension Ruth Elliott is working on healthy weight loss and exercise to improve blood pressure control. We will watch for signs of hypotension as she continues her lifestyle modifications. Continue current treatment plan.  3. At risk for diabetes mellitus Ruth Elliott was given approximately 15 minutes of diabetes education and counseling today. We discussed intensive lifestyle modifications today with an emphasis on weight loss as well as increasing exercise and decreasing simple  carbohydrates in her diet. We also reviewed medication options with an emphasis on risk versus benefit of those discussed.   Repetitive spaced learning was employed today to elicit superior memory formation and behavioral change.  4. Obesity, current BMI 36 Ruth Elliott is currently in the action stage of change. As such, her goal is to continue with weight loss efforts. She has agreed to the Category 3 Plan.   Meal plan 05/22/2020 labs reviewed  Exercise goals: As is  Behavioral modification strategies: increasing lean protein intake, decreasing simple carbohydrates, increasing vegetables, increasing water intake, decreasing eating out, no skipping meals, meal planning and cooking strategies, keeping healthy foods in the home and planning for success.  Ruth Elliott has agreed to follow-up with our clinic in 3 weeks. She was informed of the importance of frequent follow-up visits to maximize her success with intensive lifestyle modifications for her multiple health conditions.   Objective:   Blood pressure (!) 146/90, pulse 70, temperature 97.9 F (36.6 C), height 5\' 6"  (1.676 m), weight 226 lb (102.5 kg), SpO2 98 %. Body mass index is 36.48 kg/m.  General: Cooperative, alert, well developed, in no acute distress. HEENT: Conjunctivae and lids unremarkable. Cardiovascular: Regular rhythm.  Lungs: Normal work of breathing. Neurologic: No focal deficits.   Lab Results  Component Value Date   CREATININE 0.61 05/22/2020   BUN 9 05/22/2020   NA 139 05/22/2020   K 4.0 05/22/2020   CL 102 05/22/2020   CO2 23 05/22/2020   Lab Results  Component Value Date   ALT 17 05/22/2020   AST 21 05/22/2020   ALKPHOS 62 05/22/2020  BILITOT 0.5 05/22/2020   Lab Results  Component Value Date   HGBA1C 5.6 05/22/2020   HGBA1C 5.5 08/09/2019   HGBA1C 5.3 09/28/2018   HGBA1C 5.4 08/18/2017   HGBA1C 5.1 08/19/2016   Lab Results  Component Value Date   INSULIN 20.8 05/22/2020   INSULIN 16.2 02/14/2020    INSULIN 12.7 08/30/2019   Lab Results  Component Value Date   TSH 1.420 09/28/2018   Lab Results  Component Value Date   CHOL 112 02/14/2020   HDL 42 02/14/2020   LDLCALC 55 02/14/2020   TRIG 69 02/14/2020   CHOLHDL 2.5 08/09/2019   Lab Results  Component Value Date   WBC 6.8 08/09/2019   HGB 15.1 08/09/2019   HCT 42.3 08/09/2019   MCV 96 08/09/2019   PLT 172 08/09/2019   No results found for: IRON, TIBC, FERRITIN   Attestation Statements:   Reviewed by clinician on day of visit: allergies, medications, problem list, medical history, surgical history, family history, social history, and previous encounter notes.  Coral Ceo, CMA, am acting as Location manager for [employer], DO.  I have reviewed the above documentation for accuracy and completeness, and I agree with the above. Jearld Lesch, DO

## 2020-06-08 ENCOUNTER — Encounter (INDEPENDENT_AMBULATORY_CARE_PROVIDER_SITE_OTHER): Payer: Self-pay | Admitting: Bariatrics

## 2020-06-11 ENCOUNTER — Encounter (INDEPENDENT_AMBULATORY_CARE_PROVIDER_SITE_OTHER): Payer: Self-pay | Admitting: Bariatrics

## 2020-06-26 ENCOUNTER — Ambulatory Visit (INDEPENDENT_AMBULATORY_CARE_PROVIDER_SITE_OTHER): Payer: Managed Care, Other (non HMO) | Admitting: Bariatrics

## 2020-07-10 ENCOUNTER — Other Ambulatory Visit: Payer: Self-pay

## 2020-07-10 ENCOUNTER — Ambulatory Visit (INDEPENDENT_AMBULATORY_CARE_PROVIDER_SITE_OTHER): Payer: Managed Care, Other (non HMO) | Admitting: Bariatrics

## 2020-07-10 ENCOUNTER — Encounter (INDEPENDENT_AMBULATORY_CARE_PROVIDER_SITE_OTHER): Payer: Self-pay | Admitting: Bariatrics

## 2020-07-10 VITALS — BP 146/87 | HR 76 | Temp 98.1°F | Ht 66.0 in | Wt 226.0 lb

## 2020-07-10 DIAGNOSIS — Z9189 Other specified personal risk factors, not elsewhere classified: Secondary | ICD-10-CM

## 2020-07-10 DIAGNOSIS — E786 Lipoprotein deficiency: Secondary | ICD-10-CM

## 2020-07-10 DIAGNOSIS — R5383 Other fatigue: Secondary | ICD-10-CM

## 2020-07-10 DIAGNOSIS — Z6836 Body mass index (BMI) 36.0-36.9, adult: Secondary | ICD-10-CM

## 2020-07-10 DIAGNOSIS — E8881 Metabolic syndrome: Secondary | ICD-10-CM

## 2020-07-10 MED ORDER — METFORMIN HCL 500 MG PO TABS: 500.0000 mg | ORAL_TABLET | Freq: Every day | ORAL | 0 refills | Status: DC

## 2020-07-11 ENCOUNTER — Encounter (INDEPENDENT_AMBULATORY_CARE_PROVIDER_SITE_OTHER): Payer: Self-pay | Admitting: Bariatrics

## 2020-07-11 NOTE — Telephone Encounter (Signed)
Please review

## 2020-07-11 NOTE — Telephone Encounter (Signed)
Last OV with Dr Brown 

## 2020-07-14 NOTE — Progress Notes (Signed)
Chief Complaint:   OBESITY Ruth Elliott is here to discuss her progress with her obesity treatment plan along with follow-up of her obesity related diagnoses. Ruth Elliott is on the Category 3 Plan and states she is following her eating plan approximately 85% of the time. Ruth Elliott states she is walking 20-30 minutes 5 times per week.  Today's visit was #: 61 Starting weight: 223 lbs Starting date: 08/30/2019 Today's weight: 226 lbs Today's date: 07/10/2020 Total lbs lost to date: 0 Total lbs lost since last in-office visit: 0  Interim History: Ruth Elliott's weight remains the same. Her life has been good  Subjective:   1. Insulin resistance Ruth Elliott has a diagnosis of insulin resistance based on her elevated fasting insulin level >5. She continues to work on diet and exercise to decrease her risk of diabetes. Ruth Elliott was prescribe Ozempic, not filled.  Lab Results  Component Value Date   INSULIN 20.8 05/22/2020   INSULIN 16.2 02/14/2020   INSULIN 12.7 08/30/2019   Lab Results  Component Value Date   HGBA1C 5.6 05/22/2020   2. Low HDL (under 40) Ruth Elliott has hyperlipidemia and has been trying to improve her cholesterol levels with intensive lifestyle modification including a low saturated fat diet, exercise and weight loss. She denies any chest pain, claudication or myalgias.  She is currently not taking any medication.  Lab Results  Component Value Date   ALT 17 05/22/2020   AST 21 05/22/2020   ALKPHOS 62 05/22/2020   BILITOT 0.5 05/22/2020   Lab Results  Component Value Date   CHOL 112 02/14/2020   HDL 42 02/14/2020   LDLCALC 55 02/14/2020   TRIG 69 02/14/2020   CHOLHDL 2.5 08/09/2019   3. Fatigue Ruth Elliott reports extreme fatigue.  She is getting adequate sleep.  Reports "foggy thinking".  4. At risk for hypoglycemia Ruth Elliott is at higher risk than average for developing hypoglycemia due to insulin resistance.  Assessment/Plan:   1. Insulin resistance Ruth Elliott will continue to work on weight  loss, exercise, and decreasing simple carbohydrates to help decrease the risk of diabetes. Ruth Elliott agreed to follow-up with [name] as directed to closely monitor her progress.  - metFORMIN (GLUCOPHAGE) 500 MG tablet; Take 1 tablet (500 mg total) by mouth daily with lunch.  Dispense: 30 tablet; Refill: 0  2. Low HDL (under 40) Cardiovascular risk and specific lipid/LDL goals reviewed.  We discussed several lifestyle modifications today and Ruth Elliott will continue to work on diet, exercise and weight loss efforts. Orders and follow up as documented in patient record. She will continue to work on diet and exercise as well as activities.  Counseling Intensive lifestyle modifications are the first line treatment for this issue. Dietary changes: Increase soluble fiber. Decrease simple carbohydrates. Exercise changes: Moderate to vigorous-intensity aerobic activity 150 minutes per week if tolerated. Lipid-lowering medications: see documented in medical record.  3. Fatigue Will monitor.  4. At risk for hypoglycemia Ruth Elliott was given approximately 15 minutes of counseling today regarding prevention of hypoglycemia. She was advised of symptoms of hypoglycemia. Ruth Elliott was instructed to avoid skipping meals, eat regular protein rich meals and schedule low calorie snacks as needed.   Repetitive spaced learning was employed today to elicit superior memory formation and behavioral change  4. Obesity, current BMI 36.5 Ruth Elliott is currently in the action stage of change. As such, her goal is to continue with weight loss efforts. She has agreed to the Category 3 Plan.   She will work on meal planning and  intentional eating.  Exercise goals: As is  Behavioral modification strategies: increasing lean protein intake, decreasing simple carbohydrates, increasing vegetables, increasing water intake, decreasing eating out, no skipping meals, meal planning and cooking strategies, keeping healthy foods in the home, and planning  for success.  Ruth Elliott has agreed to follow-up with our clinic in 4 weeks. She was informed of the importance of frequent follow-up visits to maximize her success with intensive lifestyle modifications for her multiple health conditions.   Objective:   Blood pressure (!) 146/87, pulse 76, temperature 98.1 F (36.7 C), height 5\' 6"  (1.676 m), weight 226 lb (102.5 kg), SpO2 98 %. Body mass index is 36.48 kg/m.  General: Cooperative, alert, well developed, in no acute distress. HEENT: Conjunctivae and lids unremarkable. Cardiovascular: Regular rhythm.  Lungs: Normal work of breathing. Neurologic: No focal deficits.   Lab Results  Component Value Date   CREATININE 0.61 05/22/2020   BUN 9 05/22/2020   NA 139 05/22/2020   K 4.0 05/22/2020   CL 102 05/22/2020   CO2 23 05/22/2020   Lab Results  Component Value Date   ALT 17 05/22/2020   AST 21 05/22/2020   ALKPHOS 62 05/22/2020   BILITOT 0.5 05/22/2020   Lab Results  Component Value Date   HGBA1C 5.6 05/22/2020   HGBA1C 5.5 08/09/2019   HGBA1C 5.3 09/28/2018   HGBA1C 5.4 08/18/2017   HGBA1C 5.1 08/19/2016   Lab Results  Component Value Date   INSULIN 20.8 05/22/2020   INSULIN 16.2 02/14/2020   INSULIN 12.7 08/30/2019   Lab Results  Component Value Date   TSH 1.420 09/28/2018   Lab Results  Component Value Date   CHOL 112 02/14/2020   HDL 42 02/14/2020   LDLCALC 55 02/14/2020   TRIG 69 02/14/2020   CHOLHDL 2.5 08/09/2019   Lab Results  Component Value Date   VD25OH 47.5 05/22/2020   VD25OH 37.1 02/14/2020   VD25OH 47.8 08/30/2019   Lab Results  Component Value Date   WBC 6.8 08/09/2019   HGB 15.1 08/09/2019   HCT 42.3 08/09/2019   MCV 96 08/09/2019   PLT 172 08/09/2019   Attestation Statements:   Reviewed by clinician on day of visit: allergies, medications, problem list, medical history, surgical history, family history, social history, and previous encounter notes.  ILennette Elliott, CMA, am acting as  Location manager for [employer], DO.  I have reviewed the above documentation for accuracy and completeness, and I agree with the above. Ruth Lesch, DO

## 2020-07-18 ENCOUNTER — Encounter (INDEPENDENT_AMBULATORY_CARE_PROVIDER_SITE_OTHER): Payer: Self-pay | Admitting: Bariatrics

## 2020-07-31 ENCOUNTER — Encounter (INDEPENDENT_AMBULATORY_CARE_PROVIDER_SITE_OTHER): Payer: Self-pay | Admitting: Bariatrics

## 2020-07-31 ENCOUNTER — Ambulatory Visit (INDEPENDENT_AMBULATORY_CARE_PROVIDER_SITE_OTHER): Payer: Managed Care, Other (non HMO) | Admitting: Bariatrics

## 2020-07-31 ENCOUNTER — Other Ambulatory Visit: Payer: Self-pay

## 2020-07-31 VITALS — BP 150/89 | HR 70 | Temp 97.9°F | Ht 66.0 in | Wt 222.0 lb

## 2020-07-31 DIAGNOSIS — I10 Essential (primary) hypertension: Secondary | ICD-10-CM | POA: Diagnosis not present

## 2020-07-31 DIAGNOSIS — E8881 Metabolic syndrome: Secondary | ICD-10-CM

## 2020-07-31 DIAGNOSIS — Z9189 Other specified personal risk factors, not elsewhere classified: Secondary | ICD-10-CM

## 2020-07-31 DIAGNOSIS — Z6836 Body mass index (BMI) 36.0-36.9, adult: Secondary | ICD-10-CM

## 2020-07-31 DIAGNOSIS — R7303 Prediabetes: Secondary | ICD-10-CM

## 2020-07-31 DIAGNOSIS — E559 Vitamin D deficiency, unspecified: Secondary | ICD-10-CM

## 2020-07-31 MED ORDER — METFORMIN HCL 500 MG PO TABS: 500.0000 mg | ORAL_TABLET | Freq: Every day | ORAL | 0 refills | Status: DC

## 2020-08-08 NOTE — Progress Notes (Signed)
Chief Complaint:   OBESITY Ruth Elliott is here to discuss her progress with her obesity treatment plan along with follow-up of her obesity related diagnoses. Ruth Elliott is on the Category 3 Plan and states she is following her eating plan approximately 50% of the time. Ruth Elliott states she is walking for 20-25 minutes 3-4 times per week.  Today's visit was #: 72 Starting weight: 223 lbs Starting date: 08/30/2019 Today's weight: 222 lbs Today's date: 07/31/2020 Total lbs lost to date: 1 lb Total lbs lost since last in-office visit: 4 lbs  Interim History: Ruth Elliott is down 4 lbs since her last visit. She is trying to eat more protein. She is doing well with water and protein.  Subjective:   1. Essential hypertension Christella's hypertension is reasonably well controlled.  2. Vitamin D insufficiency Ruth Elliott is taking Vitamin D.  3. Prediabetes Ruth Elliott is taking Metformin with no side effects.  4. At risk for side effect of medication Ruth Elliott is at risk for side effect of medication due to Metformin. She denies nausea and vomiting.   5. Insulin resistance Ruth Elliott has a diagnosis of insulin resistance based on her elevated fasting insulin level >5. She continues to work on diet and exercise to decrease her risk of diabetes.  Assessment/Plan:   1. Essential hypertension Berdina is working on healthy weight loss and exercise to improve blood pressure control. She will continue her medications. She will continue no added salt.We will watch for signs of hypotension as she continues her lifestyle modifications.   2. Vitamin D insufficiency Low Vitamin D level contributes to fatigue and are associated with obesity, breast, and colon cancer. She agrees to continue to take OTC Vitamin D and will follow-up for routine testing of Vitamin D, at least 2-3 times per year to avoid over-replacement.  3. Prediabetes Ruth Elliott will continue to work on weight loss, exercise, and decreasing simple carbohydrates to help decrease  the risk of diabetes. We will refill Metformin 500 mg for 1 month with no refills.  - metFORMIN (GLUCOPHAGE) 500 MG tablet; Take 1 tablet (500 mg total) by mouth daily with lunch.  Dispense: 30 tablet; Refill: 0  4. At risk for side effect of medication Ruth Elliott was given approximately 15 minutes of counseling today to help her avoid medication non-adherence.  We discussed importance of taking medications at a similar time each day and the use of daily pill organizers to help improve medication adherence.  Repetitive spaced learning was employed today to elicit superior memory formation and behavioral change.   5. Insulin resistance Ruth Elliott will continue to work on weight loss, exercise, and decreasing simple carbohydrates to help decrease the risk of diabetes. Ruth Elliott agreed to follow-up with [name] as directed to closely monitor her progress.  6. Obesity, current BMI 35.9 Ruth Elliott is currently in the action stage of change. As such, her goal is to continue with weight loss efforts. She has agreed to the Category 3 Plan.   Ruth Elliott will continue to meal plan. She will continue with intentional eating. She will read labels.  Exercise goals:  As is. And going to the gym.  Behavioral modification strategies: increasing lean protein intake, decreasing simple carbohydrates, increasing vegetables, increasing water intake, decreasing eating out, no skipping meals, meal planning and cooking strategies, keeping healthy foods in the home, and planning for success.  Ruth Elliott has agreed to follow-up with our clinic in 3 weeks. She was informed of the importance of frequent follow-up visits to maximize her success with intensive  lifestyle modifications for her multiple health conditions.   Objective:   Blood pressure (!) 150/89, pulse 70, temperature 97.9 F (36.6 C), height '5\' 6"'$  (1.676 m), weight 222 lb (100.7 kg), SpO2 98 %. Body mass index is 35.83 kg/m.  General: Cooperative, alert, well developed, in no acute  distress. HEENT: Conjunctivae and lids unremarkable. Cardiovascular: Regular rhythm.  Lungs: Normal work of breathing. Neurologic: No focal deficits.   Lab Results  Component Value Date   CREATININE 0.61 05/22/2020   BUN 9 05/22/2020   NA 139 05/22/2020   K 4.0 05/22/2020   CL 102 05/22/2020   CO2 23 05/22/2020   Lab Results  Component Value Date   ALT 17 05/22/2020   AST 21 05/22/2020   ALKPHOS 62 05/22/2020   BILITOT 0.5 05/22/2020   Lab Results  Component Value Date   HGBA1C 5.6 05/22/2020   HGBA1C 5.5 08/09/2019   HGBA1C 5.3 09/28/2018   HGBA1C 5.4 08/18/2017   HGBA1C 5.1 08/19/2016   Lab Results  Component Value Date   INSULIN 20.8 05/22/2020   INSULIN 16.2 02/14/2020   INSULIN 12.7 08/30/2019   Lab Results  Component Value Date   TSH 1.420 09/28/2018   Lab Results  Component Value Date   CHOL 112 02/14/2020   HDL 42 02/14/2020   LDLCALC 55 02/14/2020   TRIG 69 02/14/2020   CHOLHDL 2.5 08/09/2019   Lab Results  Component Value Date   VD25OH 47.5 05/22/2020   VD25OH 37.1 02/14/2020   VD25OH 47.8 08/30/2019   Lab Results  Component Value Date   WBC 6.8 08/09/2019   HGB 15.1 08/09/2019   HCT 42.3 08/09/2019   MCV 96 08/09/2019   PLT 172 08/09/2019   No results found for: IRON, TIBC, FERRITIN  Attestation Statements:   Reviewed by clinician on day of visit: allergies, medications, problem list, medical history, surgical history, family history, social history, and previous encounter notes.  I, Lizbeth Bark, RMA, am acting as Location manager for [employer], DO.  I have reviewed the above documentation for accuracy and completeness, and I agree with the above. Jearld Lesch, DO

## 2020-08-09 ENCOUNTER — Encounter (INDEPENDENT_AMBULATORY_CARE_PROVIDER_SITE_OTHER): Payer: Self-pay | Admitting: Bariatrics

## 2020-08-19 ENCOUNTER — Other Ambulatory Visit (INDEPENDENT_AMBULATORY_CARE_PROVIDER_SITE_OTHER): Payer: Self-pay | Admitting: Bariatrics

## 2020-08-19 DIAGNOSIS — I10 Essential (primary) hypertension: Secondary | ICD-10-CM

## 2020-08-21 ENCOUNTER — Other Ambulatory Visit: Payer: Self-pay

## 2020-08-21 ENCOUNTER — Ambulatory Visit (INDEPENDENT_AMBULATORY_CARE_PROVIDER_SITE_OTHER): Payer: Managed Care, Other (non HMO) | Admitting: Bariatrics

## 2020-08-21 ENCOUNTER — Encounter (INDEPENDENT_AMBULATORY_CARE_PROVIDER_SITE_OTHER): Payer: Self-pay | Admitting: Bariatrics

## 2020-08-21 VITALS — BP 150/86 | HR 70 | Temp 98.2°F | Ht 66.0 in | Wt 226.0 lb

## 2020-08-21 DIAGNOSIS — Z6836 Body mass index (BMI) 36.0-36.9, adult: Secondary | ICD-10-CM | POA: Diagnosis not present

## 2020-08-21 DIAGNOSIS — I10 Essential (primary) hypertension: Secondary | ICD-10-CM | POA: Diagnosis not present

## 2020-08-21 DIAGNOSIS — R7303 Prediabetes: Secondary | ICD-10-CM | POA: Diagnosis not present

## 2020-08-21 DIAGNOSIS — Z9189 Other specified personal risk factors, not elsewhere classified: Secondary | ICD-10-CM | POA: Diagnosis not present

## 2020-08-21 MED ORDER — AMLODIPINE BESYLATE 5 MG PO TABS: ORAL_TABLET | ORAL | 0 refills | Status: DC

## 2020-08-21 NOTE — Patient Instructions (Signed)
Health Maintenance Due  Topic Date Due   Pneumococcal Vaccine 40-51 Years old (1 - PCV) Never done   Zoster Vaccines- Shingrix (1 of 2) Never done   COLONOSCOPY (Pts 45-97yr Insurance coverage will need to be confirmed)  Never done   COVID-19 Vaccine (4 - Booster for Pfizer series) 01/24/2020   PAP SMEAR-Modifier  03/23/2020   MAMMOGRAM  06/14/2020   INFLUENZA VACCINE  08/14/2020    Depression screen PHQ 2/9 08/30/2019 08/09/2019 09/28/2018  Decreased Interest 0 0 0  Down, Depressed, Hopeless 0 0 0  PHQ - 2 Score 0 0 0  Altered sleeping 0 1 -  Tired, decreased energy 0 0 -  Change in appetite 1 0 -  Feeling bad or failure about yourself  0 0 -  Trouble concentrating 0 0 -  Moving slowly or fidgety/restless 0 0 -  Suicidal thoughts 0 0 -  PHQ-9 Score 1 1 -  Difficult doing work/chores Not difficult at all Not difficult at all -

## 2020-08-21 NOTE — Progress Notes (Signed)
Chief Complaint:   OBESITY Ruth Elliott is here to discuss her progress with her obesity treatment plan along with follow-up of her obesity related diagnoses. Ruth Elliott is on the Category 3 Plan and states she is following her eating plan approximately 70% of the time. [name] states she is walking for 30 minutes 5 times per week.  Today's visit was #: 55 Starting weight: 223 lbs Starting date: 08/30/2019 Today's weight: 226 lbs Today's date: 08/21/2020 Total lbs lost to date: 0 Total lbs lost since last in-office visit: 0  Interim History: Ruth Elliott is up 4 lbs since her last visit. She is not eating more. She is getting more water.  Subjective:   1. Essential hypertension Ruth Elliott is taking her medications as directed.  2. Prediabetes Ruth Elliott is taking Metformin,  3. At risk for diabetes mellitus Ruth Elliott is at risk due to prediabetes.  Assessment/Plan:   1. Essential hypertension [name] is working on healthy weight loss and exercise to improve blood pressure control. We will watch for signs of hypotension as she continues her lifestyle modifications. We will refill Norvasc 5 mg by mouth daily 90 day supply for 1 month.  - amLODipine (NORVASC) 5 MG tablet; TAKE 1 TABLET(5 MG) BY MOUTH DAILY  Dispense: 90 tablet; Refill: 0  2. Prediabetes [name] will continue taking Metformin but take in the morning, instead of lunch. She will continue to work on weight loss, exercise, and decreasing simple carbohydrates to help decrease the risk of diabetes.    3. At risk for diabetes mellitus Ruth Elliott was given approximately 15 minutes of diabetes education and counseling today. We discussed intensive lifestyle modifications today with an emphasis on weight loss as well as increasing exercise and decreasing simple carbohydrates in her diet. We also reviewed medication options with an emphasis on risk versus benefit of those discussed.    4. Obesity, current BMI 36.5 Ruth Elliott is currently in the action stage of  change. As such, her goal is to continue with weight loss efforts. She has agreed to the Category 2 Plan.   [name] will continue meal planning. She will continue intentional eating.  Exercise goals:  As is. Ruth Elliott is not riding.  Behavioral modification strategies: increasing lean protein intake, decreasing simple carbohydrates, increasing vegetables, increasing water intake, ways to avoid night time snacking, better snacking choices, emotional eating strategies, and keeping a strict food journal.  Ruth Elliott has agreed to follow-up with our clinic in 2-3 weeks. She was informed of the importance of frequent follow-up visits to maximize her success with intensive lifestyle modifications for her multiple health conditions.   Objective:   Blood pressure (!) 150/86, pulse 70, temperature 98.2 F (36.8 C), height '5\' 6"'$  (1.676 m), weight 226 lb (102.5 kg), SpO2 98 %. Body mass index is 36.48 kg/m.  General: Cooperative, alert, well developed, in no acute distress. HEENT: Conjunctivae and lids unremarkable. Cardiovascular: Regular rhythm.  Lungs: Normal work of breathing. Neurologic: No focal deficits.   Lab Results  Component Value Date   CREATININE 0.61 05/22/2020   BUN 9 05/22/2020   NA 139 05/22/2020   K 4.0 05/22/2020   CL 102 05/22/2020   CO2 23 05/22/2020   Lab Results  Component Value Date   ALT 17 05/22/2020   AST 21 05/22/2020   ALKPHOS 62 05/22/2020   BILITOT 0.5 05/22/2020   Lab Results  Component Value Date   HGBA1C 5.6 05/22/2020   HGBA1C 5.5 08/09/2019   HGBA1C 5.3 09/28/2018   HGBA1C 5.4  08/18/2017   HGBA1C 5.1 08/19/2016   Lab Results  Component Value Date   INSULIN 20.8 05/22/2020   INSULIN 16.2 02/14/2020   INSULIN 12.7 08/30/2019   Lab Results  Component Value Date   TSH 1.420 09/28/2018   Lab Results  Component Value Date   CHOL 112 02/14/2020   HDL 42 02/14/2020   LDLCALC 55 02/14/2020   TRIG 69 02/14/2020   CHOLHDL 2.5 08/09/2019   Lab  Results  Component Value Date   VD25OH 47.5 05/22/2020   VD25OH 37.1 02/14/2020   VD25OH 47.8 08/30/2019   Lab Results  Component Value Date   WBC 6.8 08/09/2019   HGB 15.1 08/09/2019   HCT 42.3 08/09/2019   MCV 96 08/09/2019   PLT 172 08/09/2019   No results found for: IRON, TIBC, FERRITIN  Attestation Statements:   Reviewed by clinician on day of visit: allergies, medications, problem list, medical history, surgical history, family history, social history, and previous encounter notes.  I, Lizbeth Bark, RMA, am acting as Location manager for [employer], DO.   I have reviewed the above documentation for accuracy and completeness, and I agree with the above. Ruth Lesch, DO

## 2020-08-22 ENCOUNTER — Encounter (INDEPENDENT_AMBULATORY_CARE_PROVIDER_SITE_OTHER): Payer: Self-pay | Admitting: Bariatrics

## 2020-08-30 ENCOUNTER — Other Ambulatory Visit (INDEPENDENT_AMBULATORY_CARE_PROVIDER_SITE_OTHER): Payer: Self-pay | Admitting: Bariatrics

## 2020-08-30 DIAGNOSIS — E8881 Metabolic syndrome: Secondary | ICD-10-CM

## 2020-08-31 ENCOUNTER — Encounter (INDEPENDENT_AMBULATORY_CARE_PROVIDER_SITE_OTHER): Payer: Self-pay | Admitting: Bariatrics

## 2020-08-31 ENCOUNTER — Other Ambulatory Visit (INDEPENDENT_AMBULATORY_CARE_PROVIDER_SITE_OTHER): Payer: Self-pay | Admitting: Bariatrics

## 2020-08-31 DIAGNOSIS — E8881 Metabolic syndrome: Secondary | ICD-10-CM

## 2020-08-31 MED ORDER — METFORMIN HCL 500 MG PO TABS: 500.0000 mg | ORAL_TABLET | Freq: Every day | ORAL | 0 refills | Status: DC

## 2020-08-31 NOTE — Telephone Encounter (Signed)
LAST APPOINTMENT DATE: 08/21/20 NEXT APPOINTMENT DATE: 09/11/20   [facility] [id number] [place] [address] Phone: [phone] Fax: [phone]  Patient is requesting a refill of the following medications: No prescriptions requested or ordered in this encounter   Date last filled: 07/31/20 Previously prescribed by Dr. Owens Shark  Lab Results      Component                Value               Date                      HGBA1C                   5.6                 05/22/2020                HGBA1C                   5.5                 08/09/2019                HGBA1C                   5.3                 09/28/2018           Lab Results      Component                Value               Date                      LDLCALC                  55                  02/14/2020                CREATININE               0.61                05/22/2020           Lab Results      Component                Value               Date                      VD25OH                   47.5                05/22/2020                VD25OH                   37.1                02/14/2020                VD25OH  47.8                08/30/2019            BP Readings from Last 3 Encounters: 08/21/20 : (!) 150/86 07/31/20 : (!) 150/89 07/10/20 : (!) 146/87

## 2020-09-11 ENCOUNTER — Ambulatory Visit (INDEPENDENT_AMBULATORY_CARE_PROVIDER_SITE_OTHER): Payer: Managed Care, Other (non HMO) | Admitting: Bariatrics

## 2020-09-11 ENCOUNTER — Other Ambulatory Visit: Payer: Self-pay

## 2020-09-11 VITALS — BP 177/94 | HR 74 | Temp 98.5°F | Ht 66.0 in | Wt 221.0 lb

## 2020-09-11 DIAGNOSIS — R7303 Prediabetes: Secondary | ICD-10-CM | POA: Diagnosis not present

## 2020-09-11 DIAGNOSIS — Z6836 Body mass index (BMI) 36.0-36.9, adult: Secondary | ICD-10-CM | POA: Diagnosis not present

## 2020-09-11 DIAGNOSIS — I10 Essential (primary) hypertension: Secondary | ICD-10-CM

## 2020-09-11 NOTE — Progress Notes (Signed)
Chief Complaint:   OBESITY Ruth Elliott is here to discuss her progress with her obesity treatment plan along with follow-up of her obesity related diagnoses. Ruth Elliott is on the Category 2 Plan and states she is following her eating plan approximately 75% of the time. Ruth Elliott states she is walking for 20-30 minutes 5 times per week.  Today's visit was #: 20 Starting weight: 223 lbs Starting date: 08/30/2019 Today's weight: 221 lbs Today's date: 09/11/2020 Total lbs lost to date: 2 lbs Total lbs lost since last in-office visit: 5 lbs  Interim History: Tara is down 5 lbs since her last visit. She has been traveling and is surprised about the weight loss.  Subjective:   1. Essential hypertension Ruth Elliott is currently taking Norvasc. She has not taken her medications. Her blood  pressure is increase today at 177/94. She is taking sinus/cough medication.  2. Prediabetes Ruth Elliott is currently taking Metformin.  Assessment/Plan:   1. Essential hypertension Ruth Elliott will continue taking her medication. She will check her blood pressure at home. She is working on healthy weight loss and exercise to improve blood pressure control. We will watch for signs of hypotension as she continues her lifestyle modifications.  2. Prediabetes Ruth Elliott will continue her medications. She will continue to work on weight loss, exercise, and decreasing simple carbohydrates to help decrease the risk of diabetes.    3. Obesity BMI today 77 Ruth Elliott is currently in the action stage of change. As such, her goal is to continue with weight loss efforts. She has agreed to the Category 2 Plan.   Ruth Elliott will continue meal planning. She will stay with the diet at least 80-85%. She will decrease carbohydrates and increase protein.  Exercise goals:  As is.  Behavioral modification strategies: increasing lean protein intake, decreasing simple carbohydrates, increasing vegetables, increasing water intake, decreasing eating out, no skipping  meals, meal planning and cooking strategies, keeping healthy foods in the home, and planning for success.  Ruth Elliott has agreed to follow-up with our clinic in 4 weeks. She was informed of the importance of frequent follow-up visits to maximize her success with intensive lifestyle modifications for her multiple health conditions.   Objective:   Blood pressure (!) 177/94, pulse 74, temperature 98.5 F (36.9 C), height '5\' 6"'$  (1.676 m), weight 221 lb (100.2 kg), SpO2 98 %. Body mass index is 35.67 kg/m.  General: Cooperative, alert, well developed, in no acute distress. HEENT: Conjunctivae and lids unremarkable. Cardiovascular: Regular rhythm.  Lungs: Normal work of breathing. Neurologic: No focal deficits.   Lab Results  Component Value Date   CREATININE 0.61 05/22/2020   BUN 9 05/22/2020   NA 139 05/22/2020   K 4.0 05/22/2020   CL 102 05/22/2020   CO2 23 05/22/2020   Lab Results  Component Value Date   ALT 17 05/22/2020   AST 21 05/22/2020   ALKPHOS 62 05/22/2020   BILITOT 0.5 05/22/2020   Lab Results  Component Value Date   HGBA1C 5.6 05/22/2020   HGBA1C 5.5 08/09/2019   HGBA1C 5.3 09/28/2018   HGBA1C 5.4 08/18/2017   HGBA1C 5.1 08/19/2016   Lab Results  Component Value Date   INSULIN 20.8 05/22/2020   INSULIN 16.2 02/14/2020   INSULIN 12.7 08/30/2019   Lab Results  Component Value Date   TSH 1.420 09/28/2018   Lab Results  Component Value Date   CHOL 112 02/14/2020   HDL 42 02/14/2020   LDLCALC 55 02/14/2020   TRIG 69 02/14/2020  CHOLHDL 2.5 08/09/2019   Lab Results  Component Value Date   VD25OH 47.5 05/22/2020   VD25OH 37.1 02/14/2020   VD25OH 47.8 08/30/2019   Lab Results  Component Value Date   WBC 6.8 08/09/2019   HGB 15.1 08/09/2019   HCT 42.3 08/09/2019   MCV 96 08/09/2019   PLT 172 08/09/2019   No results found for: IRON, TIBC, FERRITIN  Attestation Statements:   Reviewed by clinician on day of visit: allergies, medications, problem  list, medical history, surgical history, family history, social history, and previous encounter notes.  Time spent on visit including pre-visit chart review and post-visit care and charting was 20 minutes.   I, Lizbeth Bark, RMA, am acting as Location manager for [employer], DO.   I have reviewed the above documentation for accuracy and completeness, and I agree with the above. Jearld Lesch, DO

## 2020-09-12 ENCOUNTER — Encounter (INDEPENDENT_AMBULATORY_CARE_PROVIDER_SITE_OTHER): Payer: Self-pay | Admitting: Bariatrics

## 2020-10-03 ENCOUNTER — Other Ambulatory Visit (INDEPENDENT_AMBULATORY_CARE_PROVIDER_SITE_OTHER): Payer: Self-pay | Admitting: Bariatrics

## 2020-10-03 DIAGNOSIS — E8881 Metabolic syndrome: Secondary | ICD-10-CM

## 2020-10-09 ENCOUNTER — Encounter (INDEPENDENT_AMBULATORY_CARE_PROVIDER_SITE_OTHER): Payer: Self-pay | Admitting: Bariatrics

## 2020-10-09 ENCOUNTER — Other Ambulatory Visit: Payer: Self-pay

## 2020-10-09 ENCOUNTER — Ambulatory Visit (INDEPENDENT_AMBULATORY_CARE_PROVIDER_SITE_OTHER): Payer: Managed Care, Other (non HMO) | Admitting: Bariatrics

## 2020-10-09 VITALS — BP 150/92 | HR 73 | Temp 98.2°F | Ht 66.0 in | Wt 223.0 lb

## 2020-10-09 DIAGNOSIS — I10 Essential (primary) hypertension: Secondary | ICD-10-CM | POA: Diagnosis not present

## 2020-10-09 DIAGNOSIS — E8881 Metabolic syndrome: Secondary | ICD-10-CM | POA: Diagnosis not present

## 2020-10-09 DIAGNOSIS — Z9189 Other specified personal risk factors, not elsewhere classified: Secondary | ICD-10-CM

## 2020-10-09 DIAGNOSIS — Z6836 Body mass index (BMI) 36.0-36.9, adult: Secondary | ICD-10-CM | POA: Diagnosis not present

## 2020-10-09 MED ORDER — METFORMIN HCL 500 MG PO TABS: 500.0000 mg | ORAL_TABLET | Freq: Every day | ORAL | 0 refills | Status: DC

## 2020-10-09 NOTE — Progress Notes (Signed)
Chief Complaint:   OBESITY Ruth Elliott is here to discuss her progress with her obesity treatment plan along with follow-up of her obesity related diagnoses. Ruth Elliott is on the Category 2 Plan and states she is following her eating plan approximately 75% of the time. Ruth Elliott states she is walking for 30 minutes 1-2 times per week.  Today's visit was #: 21 Starting weight: 223 lbs Starting date: 08/30/2019 Today's weight: 223 lbs Today's date: 10/09/2020 Total lbs lost to date: 0 Total lbs lost since last in-office visit: 0  Interim History: Ruth Elliott is up 2 lbs since her last visit.   Subjective:   1. Insulin resistance Ruth Elliott is currently taking Metformin. Her appetite is low.  2. Essential hypertension Ruth Elliott is currently taking Norvasc. Her hypertension is sightly elevated today. She is having sinus issues and taking her medications.   3. At risk for diabetes mellitus Ruth Elliott is at risk for diabetes mellitus due to Insulin resistance, history of pre-diabetes and obesity.  Assessment/Plan:   1. Insulin resistance Ruth Elliott will continue to work on weight loss, exercise, and decreasing simple carbohydrates to help decrease the risk of diabetes. We will refill Metformin 500 mg for 1 month with no refills. Ruth Elliott agreed to follow-up with Ruth Elliott as directed to closely monitor her progress.  - metFORMIN (GLUCOPHAGE) 500 MG tablet; Take 1 tablet (500 mg total) by mouth daily with lunch.  Dispense: 90 tablet; Refill: 0  2. Essential hypertension Blood pressure was elevated today at 150/92.  Ruth Elliott will continue medications. She is working on healthy weight loss and exercise to improve blood pressure control. We will watch for signs of hypotension as she continues her lifestyle modifications.  3. At risk for diabetes mellitus Ruth Elliott was given approximately 15 minutes of diabetes education and counseling today. We discussed intensive lifestyle modifications today with an emphasis on weight loss as well as  increasing exercise and decreasing simple carbohydrates in her diet. We also reviewed medication options with an emphasis on risk versus benefit of those discussed.   Repetitive spaced learning was employed today to elicit superior memory formation and behavioral change.   4. Obesity, current  BMI 36 Sweet is currently in the action stage of change. As such, her goal is to continue with weight loss efforts. She has agreed to the Category 2 Plan.   Ruth Elliott will continue meal planning. She will adhere closely to the plan 80-90%. She will decrease carbohydrates. She will have a protein shake at breakfast (sheet given today).  Exercise goals:  As is.   Behavioral modification strategies: increasing lean protein intake, decreasing simple carbohydrates, increasing vegetables, increasing water intake, decreasing eating out, no skipping meals, meal planning and cooking strategies, keeping healthy foods in the home, and planning for success.  Ruth Elliott has agreed to follow-up with our clinic in 2-3 weeks (fasting). She was informed of the importance of frequent follow-up visits to maximize her success with intensive lifestyle modifications for her multiple health conditions.   Objective:   Body mass index is 35.99 kg/m.  General: Cooperative, alert, well developed, in no acute distress. HEENT: Conjunctivae and lids unremarkable. Cardiovascular: Regular rhythm.  Lungs: Normal work of breathing. Neurologic: No focal deficits.   Lab Results  Component Value Date   CREATININE 0.61 05/22/2020   BUN 9 05/22/2020   NA 139 05/22/2020   K 4.0 05/22/2020   CL 102 05/22/2020   CO2 23 05/22/2020   Lab Results  Component Value Date   ALT 17  05/22/2020   AST 21 05/22/2020   ALKPHOS 62 05/22/2020   BILITOT 0.5 05/22/2020   Lab Results  Component Value Date   HGBA1C 5.6 05/22/2020   HGBA1C 5.5 08/09/2019   HGBA1C 5.3 09/28/2018   HGBA1C 5.4 08/18/2017   HGBA1C 5.1 08/19/2016   Lab Results   Component Value Date   INSULIN 20.8 05/22/2020   INSULIN 16.2 02/14/2020   INSULIN 12.7 08/30/2019   Lab Results  Component Value Date   TSH 1.420 09/28/2018   Lab Results  Component Value Date   CHOL 112 02/14/2020   HDL 42 02/14/2020   LDLCALC 55 02/14/2020   TRIG 69 02/14/2020   CHOLHDL 2.5 08/09/2019   Lab Results  Component Value Date   VD25OH 47.5 05/22/2020   VD25OH 37.1 02/14/2020   VD25OH 47.8 08/30/2019   Lab Results  Component Value Date   WBC 6.8 08/09/2019   HGB 15.1 08/09/2019   HCT 42.3 08/09/2019   MCV 96 08/09/2019   PLT 172 08/09/2019   No results found for: IRON, TIBC, FERRITIN   Attestation Statements:   Reviewed by clinician on day of visit: allergies, medications, problem list, medical history, surgical history, family history, social history, and previous encounter notes.   I, Lizbeth Bark, RMA, am acting as Location manager for [employer], DO.   I have reviewed the above documentation for accuracy and completeness, and I agree with the above. Jearld Lesch, DO

## 2020-10-10 ENCOUNTER — Encounter (INDEPENDENT_AMBULATORY_CARE_PROVIDER_SITE_OTHER): Payer: Self-pay | Admitting: Bariatrics

## 2020-10-24 ENCOUNTER — Other Ambulatory Visit: Payer: Self-pay

## 2020-10-24 ENCOUNTER — Ambulatory Visit (INDEPENDENT_AMBULATORY_CARE_PROVIDER_SITE_OTHER): Payer: Managed Care, Other (non HMO) | Admitting: Bariatrics

## 2020-10-24 ENCOUNTER — Encounter (INDEPENDENT_AMBULATORY_CARE_PROVIDER_SITE_OTHER): Payer: Self-pay | Admitting: Bariatrics

## 2020-10-24 VITALS — BP 186/99 | HR 73 | Temp 97.4°F | Ht 66.0 in | Wt 223.0 lb

## 2020-10-24 DIAGNOSIS — E559 Vitamin D deficiency, unspecified: Secondary | ICD-10-CM

## 2020-10-24 DIAGNOSIS — I10 Essential (primary) hypertension: Secondary | ICD-10-CM

## 2020-10-24 DIAGNOSIS — Z6836 Body mass index (BMI) 36.0-36.9, adult: Secondary | ICD-10-CM

## 2020-10-24 DIAGNOSIS — E041 Nontoxic single thyroid nodule: Secondary | ICD-10-CM

## 2020-10-24 DIAGNOSIS — R7303 Prediabetes: Secondary | ICD-10-CM

## 2020-10-24 NOTE — Progress Notes (Signed)
Chief Complaint:   OBESITY Ruth Elliott is here to discuss her progress with her obesity treatment plan along with follow-up of her obesity related diagnoses. Ruth Elliott is on the Category 2 Plan and states she is following her eating plan approximately 70% of the time. Ruth Elliott states she is walking the dog for 30 minutes 6 times per week and walking the horse for 45 minutes 6 times per week.   Today's visit was #: 22 Starting weight: 223 lbs Starting date: 08/30/2019 Today's weight: 223 lbs Today's date: 10/24/2020 Total lbs lost to date: 0 Total lbs lost since last in-office visit: 0  Interim History: Ruth Elliott's body weight remain the same. She is getting more protein but skipping meals. She is trying to get her water in.   Subjective:   1. Essential hypertension Ruth Elliott is currently taking Norvasc 5 mg. Her blood pressure was 130/80. Her blood pressure was elevated today and last visit.  2. Prediabetes Ruth Elliott is taking currently Metformin 500 mg. She notes bloated stomach has increased.   3. Thyroid cyst Current symptoms: none  4. Vitamin D deficiency Ruth Elliott is taking Vitamin D currently.   Assessment/Plan:   1. Essential hypertension Ruth Elliott will check blood pressure at home and record it. She will call the office with readings. She will increase Norvasc from 5 mg to 10 mg. We will check labs today. Ruth Elliott is working on healthy weight loss and exercise to improve blood pressure control. We will watch for signs of hypotension as she continues her lifestyle modifications.  - Comprehensive metabolic panel - Lipid Panel With LDL/HDL Ratio  2. Prediabetes We will refill Metformin 500 mg for 1 month with no refills. Ruth Elliott will continue to work on weight loss, exercise, and decreasing simple carbohydrates to help decrease the risk of diabetes.   - metFORMIN (GLUCOPHAGE) 500 MG tablet; TAKE 1 TABLET BY MOUTH ONCE DAILY WITH LUNCH  Dispense: 90 tablet; Refill: 0 - Insulin, random - Hemoglobin  A1c - Lipid Panel With LDL/HDL Ratio  3. Thyroid cyst We will check thyroid panel today. Orders and follow up as documented in patient record.  Counseling Good thyroid control is important for overall health. Supratherapeutic thyroid levels are dangerous and will not improve weight loss results. Counseling: The correct way to take levothyroxine is fasting, with water, separated by at least 30 minutes from breakfast, and separated by more than 4 hours from calcium, iron, multivitamins, acid reflux medications (PPIs).    - TSH+T4F+T3Free  4. Vitamin D deficiency Low Vitamin D level contributes to fatigue and are associated with obesity, breast, and colon cancer. Ruth Elliott agrees to continue to take prescription Vitamin D 1,000 IU daily and she will follow-up for routine testing of Vitamin D, at least 2-3 times per year to avoid over-replacement.  - VITAMIN D 25 Hydroxy (Vit-D Deficiency, Fractures)  5. Obesity, current  BMI of 36.0 Ruth Elliott is currently in the action stage of change. As such, her goal is to continue with weight loss efforts. She has agreed to the Category 2 Plan.   Ruth Elliott will be mindful eating. She will adhere closely to the plan 80-90%. She will increase her water intake.  Exercise goals:  As is.  Behavioral modification strategies: increasing lean protein intake, decreasing simple carbohydrates, increasing vegetables, increasing water intake, decreasing eating out, no skipping meals, meal planning and cooking strategies, keeping healthy foods in the home, and planning for success.  Ruth Elliott has agreed to follow-up with our clinic in 2-3 weeks. She  was informed of the importance of frequent follow-up visits to maximize her success with intensive lifestyle modifications for her multiple health conditions.   Ruth Elliott was informed we would discuss her lab results at her next visit unless there is a critical issue that needs to be addressed sooner. Ruth Elliott agreed to keep her next visit at  the agreed upon time to discuss these results.  Objective:   Blood pressure (!) 186/99, pulse 73, temperature (!) 97.4 F (36.3 C), height 5\' 6"  (1.676 m), weight 223 lb (101.2 kg), SpO2 99 %. Body mass index is 35.99 kg/m.  General: Cooperative, alert, well developed, in no acute distress. HEENT: Conjunctivae and lids unremarkable. Cardiovascular: Regular rhythm.  Lungs: Normal work of breathing. Neurologic: No focal deficits.   Lab Results  Component Value Date   CREATININE 0.61 05/22/2020   BUN 9 05/22/2020   NA 139 05/22/2020   K 4.0 05/22/2020   CL 102 05/22/2020   CO2 23 05/22/2020   Lab Results  Component Value Date   ALT 17 05/22/2020   AST 21 05/22/2020   ALKPHOS 62 05/22/2020   BILITOT 0.5 05/22/2020   Lab Results  Component Value Date   HGBA1C 5.6 05/22/2020   HGBA1C 5.5 08/09/2019   HGBA1C 5.3 09/28/2018   HGBA1C 5.4 08/18/2017   HGBA1C 5.1 08/19/2016   Lab Results  Component Value Date   INSULIN 20.8 05/22/2020   INSULIN 16.2 02/14/2020   INSULIN 12.7 08/30/2019   Lab Results  Component Value Date   TSH 1.420 09/28/2018   Lab Results  Component Value Date   CHOL 112 02/14/2020   HDL 42 02/14/2020   LDLCALC 55 02/14/2020   TRIG 69 02/14/2020   CHOLHDL 2.5 08/09/2019   Lab Results  Component Value Date   VD25OH 47.5 05/22/2020   VD25OH 37.1 02/14/2020   VD25OH 47.8 08/30/2019   Lab Results  Component Value Date   WBC 6.8 08/09/2019   HGB 15.1 08/09/2019   HCT 42.3 08/09/2019   MCV 96 08/09/2019   PLT 172 08/09/2019   No results found for: IRON, TIBC, FERRITIN  Attestation Statements:   Reviewed by clinician on day of visit: allergies, medications, problem list, medical history, surgical history, family history, social history, and previous encounter notes.   I, Lizbeth Bark, RMA, am acting as Location manager for [employer], DO.   I have reviewed the above documentation for accuracy and completeness, and I agree with the  above. Jearld Lesch, DO

## 2020-10-25 ENCOUNTER — Encounter (INDEPENDENT_AMBULATORY_CARE_PROVIDER_SITE_OTHER): Payer: Self-pay | Admitting: Bariatrics

## 2020-10-25 LAB — LIPID PANEL WITH LDL/HDL RATIO
Cholesterol, Total: 110 mg/dL (ref 100–199)
HDL: 43 mg/dL (ref >39)
LDL Chol Calc (NIH): 46 mg/dL (ref 0–99)
LDL/HDL Ratio: 1.1 ratio (ref 0.0–3.2)
Triglycerides: 118 mg/dL (ref 0–149)
VLDL Cholesterol Cal: 21 mg/dL (ref 5–40)

## 2020-10-25 LAB — COMPREHENSIVE METABOLIC PANEL
ALT: 18 IU/L (ref 0–32)
AST: 21 IU/L (ref 0–40)
Albumin/Globulin Ratio: 2 (ref 1.2–2.2)
Albumin: 4.5 g/dL (ref 3.8–4.8)
Alkaline Phosphatase: 67 IU/L (ref 44–121)
BUN/Creatinine Ratio: 16 (ref 9–23)
BUN: 11 mg/dL (ref 6–24)
Bilirubin Total: 0.7 mg/dL (ref 0.0–1.2)
CO2: 23 mmol/L (ref 20–29)
Calcium: 9.4 mg/dL (ref 8.7–10.2)
Chloride: 101 mmol/L (ref 96–106)
Creatinine, Ser: 0.69 mg/dL (ref 0.57–1.00)
Globulin, Total: 2.3 g/dL (ref 1.5–4.5)
Glucose: 90 mg/dL (ref 70–99)
Potassium: 4.1 mmol/L (ref 3.5–5.2)
Sodium: 141 mmol/L (ref 134–144)
Total Protein: 6.8 g/dL (ref 6.0–8.5)
eGFR: 106 mL/min/{1.73_m2} (ref >59)

## 2020-10-25 LAB — HEMOGLOBIN A1C
Est. average glucose Bld gHb Est-mCnc: 111 mg/dL
Hgb A1c MFr Bld: 5.5 % (ref 4.8–5.6)

## 2020-10-25 LAB — TSH+T4F+T3FREE
Free T4: 1.25 ng/dL (ref 0.82–1.77)
T3, Free: 3.5 pg/mL (ref 2.0–4.4)
TSH: 2.28 u[IU]/mL (ref 0.450–4.500)

## 2020-10-25 LAB — VITAMIN D 25 HYDROXY (VIT D DEFICIENCY, FRACTURES): Vit D, 25-Hydroxy: 51.2 ng/mL (ref 30.0–100.0)

## 2020-10-25 LAB — INSULIN, RANDOM: INSULIN: 15.2 u[IU]/mL (ref 2.6–24.9)

## 2020-11-13 ENCOUNTER — Ambulatory Visit: Payer: Managed Care, Other (non HMO) | Admitting: Cardiovascular Disease

## 2020-11-13 ENCOUNTER — Other Ambulatory Visit: Payer: Self-pay

## 2020-11-13 ENCOUNTER — Ambulatory Visit (INDEPENDENT_AMBULATORY_CARE_PROVIDER_SITE_OTHER): Payer: Managed Care, Other (non HMO) | Admitting: Bariatrics

## 2020-11-13 ENCOUNTER — Encounter (INDEPENDENT_AMBULATORY_CARE_PROVIDER_SITE_OTHER): Payer: Self-pay | Admitting: Bariatrics

## 2020-11-13 ENCOUNTER — Encounter: Payer: Self-pay | Admitting: Cardiovascular Disease

## 2020-11-13 VITALS — BP 171/101 | HR 78 | Temp 98.0°F | Ht 66.0 in | Wt 225.0 lb

## 2020-11-13 VITALS — BP 172/110 | HR 77 | Ht 66.0 in | Wt 230.0 lb

## 2020-11-13 DIAGNOSIS — Z6836 Body mass index (BMI) 36.0-36.9, adult: Secondary | ICD-10-CM

## 2020-11-13 DIAGNOSIS — I10 Essential (primary) hypertension: Secondary | ICD-10-CM

## 2020-11-13 DIAGNOSIS — E786 Lipoprotein deficiency: Secondary | ICD-10-CM

## 2020-11-13 DIAGNOSIS — E6609 Other obesity due to excess calories: Secondary | ICD-10-CM

## 2020-11-13 DIAGNOSIS — Z6835 Body mass index (BMI) 35.0-35.9, adult: Secondary | ICD-10-CM | POA: Diagnosis not present

## 2020-11-13 DIAGNOSIS — E8881 Metabolic syndrome: Secondary | ICD-10-CM | POA: Diagnosis not present

## 2020-11-13 MED ORDER — VALSARTAN 160 MG PO TABS: 160.0000 mg | ORAL_TABLET | Freq: Every day | ORAL | 3 refills | Status: DC

## 2020-11-13 MED ORDER — METFORMIN HCL 500 MG PO TABS: 500.0000 mg | ORAL_TABLET | Freq: Every day | ORAL | 0 refills | Status: DC

## 2020-11-13 NOTE — Progress Notes (Signed)
Chief Complaint:   OBESITY Ruth Elliott is here to discuss her progress with her obesity treatment plan along with follow-up of her obesity related diagnoses. Ruth Elliott is on the Category 2 Plan and states she is following her eating plan approximately 50% of the time. Ruth Elliott states she is walking for 20 minutes 5 times per week and riding a horse for 30 minutes 2 times per week.  Today's visit was #: 23 Starting weight: 223 lbs Starting date: 08/30/2019 Today's weight: 225 lbs Today's date: 11/13/2020 Total lbs lost to date: 0 Total lbs lost since last in-office visit: 0  Interim History: Ruth Elliott is up 2 lbs since her last visit. Her appetite is stable.   Subjective:   1. Insulin resistance Ruth Elliott is taking her medications as directed.   2. Low HDL (under 40) Ruth Elliott is currently taking Fish oil, Flax oil-Borage oil.   3. Essential hypertension Ruth Elliott's blood pressure was elevated. Her primary care provider added a medication.   Assessment/Plan:   1. Insulin resistance Ruth Elliott will continue to work on weight loss, exercise, and decreasing simple carbohydrates to help decrease the risk of diabetes.  We will refill Metformin 500 mg for 3 months with no refills. Ruth Elliott agreed to follow-up with [name] as directed to closely monitor her progress.  - metFORMIN (GLUCOPHAGE) 500 MG tablet; Take 1 tablet (500 mg total) by mouth daily with lunch.  Dispense: 90 tablet; Refill: 0  2. Low HDL (under 40) Cardiovascular risk and specific lipid/LDL goals reviewed.  We discussed several lifestyle modifications today and Ruth Elliott will continue to work on diet, exercise and weight loss efforts. She will continue all medications. Orders and follow up as documented in patient record.   Counseling Intensive lifestyle modifications are the first line treatment for this issue. Dietary changes: Increase soluble fiber. Decrease simple carbohydrates. Exercise changes: Moderate to vigorous-intensity aerobic activity 150  minutes per week if tolerated. Lipid-lowering medications: see documented in medical record.   3. Essential hypertension Ruth Elliott will follow up with primary care provider. She is working on healthy weight loss and exercise to improve blood pressure control. We will watch for signs of hypotension as she continues her lifestyle modifications.  4. Class 2 severe obesity with serious comorbidity and body mass index (BMI) of 36.0 to 36.9 in adult, unspecified obesity type (Ruth Elliott) Ruth Elliott is currently in the action stage of change. As such, her goal is to continue with weight loss efforts. She has agreed to the Category 2 Plan.   Ruth Elliott will continue meal planning. She will decrease carbohydrates and increase protein and high fats. We will review labs from 10/24/2020.  Exercise goals:  Ruth Elliott will continue exercising and walking with hand weights.   Behavioral modification strategies: increasing lean protein intake, decreasing simple carbohydrates, increasing vegetables, increasing water intake, decreasing eating out, no skipping meals, meal planning and cooking strategies, keeping healthy foods in the home, and planning for success.  Ruth Elliott has agreed to follow-up with our clinic in 3-4 weeks. She was informed of the importance of frequent follow-up visits to maximize her success with intensive lifestyle modifications for her multiple health conditions.   Objective:   Pulse 78, temperature 98 F (36.7 C), height 5\' 6"  (1.676 m), weight 225 lb (102.1 kg), SpO2 97 %. Body mass index is 36.32 kg/m.  General: Cooperative, alert, well developed, in no acute distress. HEENT: Conjunctivae and lids unremarkable. Cardiovascular: Regular rhythm.  Lungs: Normal work of breathing. Neurologic: No focal deficits.   Lab  Results  Component Value Date   CREATININE 0.69 10/24/2020   BUN 11 10/24/2020   NA 141 10/24/2020   K 4.1 10/24/2020   CL 101 10/24/2020   CO2 23 10/24/2020   Lab Results  Component  Value Date   ALT 18 10/24/2020   AST 21 10/24/2020   ALKPHOS 67 10/24/2020   BILITOT 0.7 10/24/2020   Lab Results  Component Value Date   HGBA1C 5.5 10/24/2020   HGBA1C 5.6 05/22/2020   HGBA1C 5.5 08/09/2019   HGBA1C 5.3 09/28/2018   HGBA1C 5.4 08/18/2017   Lab Results  Component Value Date   INSULIN 15.2 10/24/2020   INSULIN 20.8 05/22/2020   INSULIN 16.2 02/14/2020   INSULIN 12.7 08/30/2019   Lab Results  Component Value Date   TSH 2.280 10/24/2020   Lab Results  Component Value Date   CHOL 110 10/24/2020   HDL 43 10/24/2020   LDLCALC 46 10/24/2020   TRIG 118 10/24/2020   CHOLHDL 2.5 08/09/2019   Lab Results  Component Value Date   VD25OH 51.2 10/24/2020   VD25OH 47.5 05/22/2020   VD25OH 37.1 02/14/2020   Lab Results  Component Value Date   WBC 6.8 08/09/2019   HGB 15.1 08/09/2019   HCT 42.3 08/09/2019   MCV 96 08/09/2019   PLT 172 08/09/2019   No results found for: IRON, TIBC, FERRITIN  Attestation Statements:   Reviewed by clinician on day of visit: allergies, medications, problem list, medical history, surgical history, family history, social history, and previous encounter notes.  I, Lizbeth Bark, RMA, am acting as Location manager for [employer], DO.   I have reviewed the above documentation for accuracy and completeness, and I agree with the above. Jearld Lesch, DO

## 2020-11-13 NOTE — Progress Notes (Signed)
Cardiology Office Note  Date:  11/13/2020   ID:  Ruth Elliott, DOB 1970/06/09, MRN [id number]  PCP:  Marval Regal, NP   Chief Complaint  Patient presents with   Follow-up    Patient c/o elevated blood pressure. Medications reviewed by the patient verbally.     HPI:  Ruth Elliott is a 50 year old woman with past medical history of Nodular goiter Recent thyroid surgery with Dr. Tami Ribas Who presents for evaluation of her hypertension  Trouble losing weight, sees a wellness clinic  Pressure at home, wrist cuff 138/90 Has not increased amlodipine about 5 mg  Feels blood pressure is elevated at work under stress Also feels that she has whitecoat syndrome, feels that going up when she gets off the elevator  Allergy issues, followed by ENT  EKG personally reviewed by myself on todays visit Shows normal sinus rhythm with rate 77 bpm nonspecific T wave abnormality 1 and aVL V6, unchanged seen previously on EKG 2017  Of past medical history reviewed BP elevated prior to and following thyroid surgery 062 systolic and higher Given Hydralazine IV, 3 doses post op by anesthesia. Down to 694 systolic with standing at the time of discharge No significant surgical pain reported At 7 PM, patient at home, systolic pressure back up to 854 systolic   She was started on hydralazine 50 mg TID PRN for systolic pressure >627 Side effects on the hydralazine, had headache  CT scan reviewed showing no carotid atherosclerosis, no aortic arch atherosclerosis  Cholesterol very low on no medication, non-smoker, nondiabetic  PMH:   has a past medical history of Allergic rhinitis, Allergy, Back pain, Endometriosis, Joint pain, Obesity, Thyroid disease, and White coat syndrome with hypertension.  PSH:    Past Surgical History:  Procedure Laterality Date   APPENDECTOMY     NASAL SINUS SURGERY     OVARIAN CYST SURGERY     THYROIDECTOMY Left 11/17/2018   Procedure: THYROIDECTOMY-Hemi  Thyroidectomy;  Surgeon: Beverly Gust, MD;  Location: [facility] ORS;  Service: ENT;  Laterality: Left;   TONSILLECTOMY  1994    Current Outpatient Medications  Medication Sig Dispense Refill   amLODipine (NORVASC) 5 MG tablet TAKE 1 TABLET(5 MG) BY MOUTH DAILY 90 tablet 0   cetirizine (ZYRTEC) 10 MG chewable tablet Chew 10 mg by mouth daily.     cholecalciferol (VITAMIN D3) 25 MCG (1000 UNIT) tablet Take 1,000 Units by mouth daily.     COLLAGEN PO Take 1 Scoop by mouth daily. Collagen Peptide in coffee in am     Flax Oil-Fish Oil-Borage Oil (FISH OIL-FLAX OIL-BORAGE OIL) CAPS Take 2 capsules by mouth daily.      fluticasone (FLONASE) 50 MCG/ACT nasal spray SHAKE LIQUID AND USE 2 SPRAYS IN EACH NOSTRIL DAILY 16 g 1   ibuprofen (ADVIL) 400 MG tablet Take 400 mg by mouth every 6 (six) hours as needed.     metFORMIN (GLUCOPHAGE) 500 MG tablet Take 500 mg by mouth 2 (two) times daily with a meal.     Multiple Vitamin (MULTIVITAMIN) capsule Take 1 capsule by mouth daily.     norethindrone-ethinyl estradiol (MICROGESTIN,JUNEL,LOESTRIN) 1-20 MG-MCG tablet Take 1 tablet by mouth daily.  0   valsartan (DIOVAN) 160 MG tablet Take 1 tablet (160 mg total) by mouth daily. 90 tablet 3   metFORMIN (GLUCOPHAGE) 500 MG tablet Take 1 tablet (500 mg total) by mouth daily with lunch. (Patient not taking: Reported on 11/13/2020) 90 tablet 0   No current facility-administered  medications for this visit.     Allergies:   Penicillins, Hydralazine, Lisinopril, and Singulair [montelukast sodium]   Social History:  The patient  reports that she has never smoked. She has never used smokeless tobacco. She reports current alcohol use. She reports that she does not use drugs.   Family History:   family history includes Alzheimer's disease in her father; COPD in her mother; Diabetes in her mother; Heart disease in her mother; Hypertension in her father and mother.    Review of Systems: Review of Systems   Constitutional: Negative.   HENT: Negative.    Respiratory: Negative.    Cardiovascular: Negative.   Gastrointestinal: Negative.   Musculoskeletal: Negative.   Neurological: Negative.   Psychiatric/Behavioral: Negative.    All other systems reviewed and are negative.  PHYSICAL EXAM: VS:  BP (!) 172/110 (BP Location: Left Arm, Patient Position: Sitting, Cuff Size: Large)   Pulse 77   Ht 5\' 6"  (1.676 m)   Wt 230 lb (104.3 kg)   SpO2 98%   BMI 37.12 kg/m  , BMI Body mass index is 37.12 kg/m. Constitutional:  oriented to person, place, and time. No distress.  HENT:  Head: Grossly normal Eyes:  no discharge. No scleral icterus.  Neck: No JVD, no carotid bruits  Cardiovascular: Regular rate and rhythm, no murmurs appreciated Pulmonary/Chest: Clear to auscultation bilaterally, no wheezes or rails Abdominal: Soft.  no distension.  no tenderness.  Musculoskeletal: Normal range of motion Neurological:  normal muscle tone. Coordination normal. No atrophy Skin: Skin warm and dry Psychiatric: normal affect, pleasant  Recent Labs: 10/24/2020: ALT 18; BUN 11; Creatinine, Ser 0.69; Potassium 4.1; Sodium 141; TSH 2.280    Lipid Panel Lab Results  Component Value Date   CHOL 110 10/24/2020   HDL 43 10/24/2020   LDLCALC 46 10/24/2020   TRIG 118 10/24/2020      Wt Readings from Last 3 Encounters:  11/13/20 230 lb (104.3 kg)  10/24/20 223 lb (101.2 kg)  10/09/20 223 lb (101.2 kg)       ASSESSMENT AND PLAN:  Problem List Items Addressed This Visit       Cardiology Problems   Essential hypertension - Primary   Relevant Medications   valsartan (DIOVAN) 160 MG tablet   Other Relevant Orders   EKG 12-Lead     Other   Class 2 obesity due to excess calories with body mass index (BMI) of 35.0 to 35.9 in adult   Relevant Medications   metFORMIN (GLUCOPHAGE) 500 MG tablet  Hypertension Elevated numbers, known Whitecoat syndrome,  markedly elevated blood pressure on today's  visit  Also noted on visits to other providers -She does report numbers trending little bit higher, high 130s at home, likely higher with stress at work Recommend she start valsartan 60 with titration up to 160 if needed, in the evening Amlodipine 5 in the morning  Goiter Prior resection of cyst Followed by endocrine    Total encounter time more than 15 minutes  Greater than 50% was spent in counseling and coordination of care with the patient    Signed, Esmond Plants, M.D., Ph.D. [place], [place]

## 2020-11-13 NOTE — Patient Instructions (Addendum)
Medication Instructions:  Please start valsartan 160 mg daily Ok to start on a 1/2 pill for the first week ot two Please take in the PM  If you need a refill on your cardiac medications before your next appointment, please call your pharmacy.   Lab work: No new labs needed  Testing/Procedures: No new testing needed  Follow-Up: At [facility], you and your health needs are our priority.  As part of our continuing mission to provide you with exceptional heart care, we have created designated Provider Care Teams.  These Care Teams include your primary Cardiologist (physician) and Advanced Practice Providers (APPs -  Physician Assistants and Nurse Practitioners) who all work together to provide you with the care you need, when you need it.  You will need a follow up appointment in 12 months  Providers on your designated Care Team:   Murray Hodgkins, NP Christell Faith, PA-C Cadence Kathlen Mody, [place]  COVID-19 Vaccine Information can be found at: [email or web address].uk For questions related to vaccine distribution or appointments, please email [email or web address] or call [phone].

## 2020-11-14 ENCOUNTER — Encounter (INDEPENDENT_AMBULATORY_CARE_PROVIDER_SITE_OTHER): Payer: Self-pay | Admitting: Bariatrics

## 2020-12-04 ENCOUNTER — Ambulatory Visit (INDEPENDENT_AMBULATORY_CARE_PROVIDER_SITE_OTHER): Payer: Managed Care, Other (non HMO) | Admitting: Bariatrics

## 2020-12-04 ENCOUNTER — Other Ambulatory Visit: Payer: Self-pay

## 2020-12-04 ENCOUNTER — Encounter (INDEPENDENT_AMBULATORY_CARE_PROVIDER_SITE_OTHER): Payer: Self-pay | Admitting: Bariatrics

## 2020-12-04 VITALS — BP 150/86 | HR 78 | Temp 97.9°F | Ht 66.0 in | Wt 226.0 lb

## 2020-12-04 DIAGNOSIS — Z6836 Body mass index (BMI) 36.0-36.9, adult: Secondary | ICD-10-CM

## 2020-12-04 DIAGNOSIS — I10 Essential (primary) hypertension: Secondary | ICD-10-CM

## 2020-12-04 DIAGNOSIS — E8881 Metabolic syndrome: Secondary | ICD-10-CM

## 2020-12-04 MED ORDER — INSULIN PEN NEEDLE 32G X 4 MM MISC: 0 refills | Status: DC

## 2020-12-04 MED ORDER — METFORMIN HCL 500 MG PO TABS: 500.0000 mg | ORAL_TABLET | Freq: Every day | ORAL | 0 refills | Status: DC

## 2020-12-04 MED ORDER — SAXENDA 18 MG/3ML ~~LOC~~ SOPN: 0.6000 mg | PEN_INJECTOR | Freq: Every day | SUBCUTANEOUS | 0 refills | Status: DC

## 2020-12-04 NOTE — Progress Notes (Signed)
Chief Complaint:   OBESITY Ruth Elliott is here to discuss her progress with her obesity treatment plan along with follow-up of her obesity related diagnoses. Ruth Elliott is on the Category 2 Plan and states she is following her eating plan approximately 60% of the time. Ruth Elliott states she is walking for 20 minutes 4 times per week.  Today's visit was #: 24 Starting weight: 223 lbs Starting date: 08/30/2019 Today's weight: 226 lbs Today's date: 12/04/2020 Total lbs lost to date: 0 Total lbs lost since last in-office visit: 0  Interim History: Ruth Elliott is up 1 lb.  Subjective:   1. Insulin resistance Ruth Elliott is taking her medications as directed.  2. Essential hypertension Ruth Elliott is currently taking Norvasc and Diovan. Her blood pressure was still elevated 120's/80's.  Assessment/Plan:   1. Insulin resistance We will refill Metformin 500 mg for 3 months with no refills. We will refill Insulin Pen Needles with no refills. Ruth Elliott will continue to work on weight loss, exercise, and decreasing simple carbohydrates to help decrease the risk of diabetes. Ruth Elliott agreed to follow-up with [name] as directed to closely monitor her progress.  - metFORMIN (GLUCOPHAGE) 500 MG tablet; Take 1 tablet (500 mg total) by mouth daily with lunch.  Dispense: 90 tablet; Refill: 0 - Insulin Pen Needle 32G X 4 MM MISC; Will begin 0.6 mg daily  Dispense: 100 each; Refill: 0  2. Essential hypertension Ruth Elliott will continue her medications and she will follow up with her primary care provider. She is working on healthy weight loss and exercise to improve blood pressure control. We will watch for signs of hypotension as she continues her lifestyle modifications.  3. Obesity, current BMI 36.4 Ruth Elliott is currently in the action stage of change. As such, her goal is to continue with weight loss efforts. She has agreed to the Category 2 Plan.   Ruth Elliott will continue meal planning and she will continue intentional eating. We will refill  Saxenda 18 mg with no refills.  - Liraglutide -Weight Management (SAXENDA) 18 MG/3ML SOPN; Inject 0.6 mg into the skin daily.  Dispense: 3 mL; Refill: 0  Exercise goals: As is.  Behavioral modification strategies: increasing lean protein intake, decreasing simple carbohydrates, increasing vegetables, increasing water intake, decreasing eating out, no skipping meals, meal planning and cooking strategies, keeping healthy foods in the home, and planning for success.  Ruth Elliott has agreed to follow-up with our clinic in 4 weeks with Mina Marble, NP or Jake Bathe, [place]. She was informed of the importance of frequent follow-up visits to maximize her success with intensive lifestyle modifications for her multiple health conditions.   Objective:   Blood pressure (!) 150/86, pulse 78, temperature 97.9 F (36.6 C), height 5\' 6"  (1.676 m), weight 226 lb (102.5 kg), SpO2 98 %. Body mass index is 36.48 kg/m.  General: Cooperative, alert, well developed, in no acute distress. HEENT: Conjunctivae and lids unremarkable. Cardiovascular: Regular rhythm.  Lungs: Normal work of breathing. Neurologic: No focal deficits.   Lab Results  Component Value Date   CREATININE 0.69 10/24/2020   BUN 11 10/24/2020   NA 141 10/24/2020   K 4.1 10/24/2020   CL 101 10/24/2020   CO2 23 10/24/2020   Lab Results  Component Value Date   ALT 18 10/24/2020   AST 21 10/24/2020   ALKPHOS 67 10/24/2020   BILITOT 0.7 10/24/2020   Lab Results  Component Value Date   HGBA1C 5.5 10/24/2020   HGBA1C 5.6 05/22/2020   HGBA1C 5.5  08/09/2019   HGBA1C 5.3 09/28/2018   HGBA1C 5.4 08/18/2017   Lab Results  Component Value Date   INSULIN 15.2 10/24/2020   INSULIN 20.8 05/22/2020   INSULIN 16.2 02/14/2020   INSULIN 12.7 08/30/2019   Lab Results  Component Value Date   TSH 2.280 10/24/2020   Lab Results  Component Value Date   CHOL 110 10/24/2020   HDL 43 10/24/2020   LDLCALC 46 10/24/2020   TRIG 118 10/24/2020    CHOLHDL 2.5 08/09/2019   Lab Results  Component Value Date   VD25OH 51.2 10/24/2020   VD25OH 47.5 05/22/2020   VD25OH 37.1 02/14/2020   Lab Results  Component Value Date   WBC 6.8 08/09/2019   HGB 15.1 08/09/2019   HCT 42.3 08/09/2019   MCV 96 08/09/2019   PLT 172 08/09/2019   No results found for: IRON, TIBC, FERRITIN  Attestation Statements:   Reviewed by clinician on day of visit: allergies, medications, problem list, medical history, surgical history, family history, social history, and previous encounter notes.  I, Lizbeth Bark, RMA, am acting as Location manager for [employer], DO.   I have reviewed the above documentation for accuracy and completeness, and I agree with the above. Jearld Lesch, DO

## 2020-12-05 ENCOUNTER — Encounter (INDEPENDENT_AMBULATORY_CARE_PROVIDER_SITE_OTHER): Payer: Self-pay | Admitting: Bariatrics

## 2020-12-11 ENCOUNTER — Telehealth (INDEPENDENT_AMBULATORY_CARE_PROVIDER_SITE_OTHER): Payer: Self-pay | Admitting: Bariatrics

## 2020-12-11 ENCOUNTER — Encounter (INDEPENDENT_AMBULATORY_CARE_PROVIDER_SITE_OTHER): Payer: Self-pay

## 2020-12-11 NOTE — Telephone Encounter (Signed)
Prior authorization approved for Saxenda. Patient sent mychart message.

## 2020-12-26 ENCOUNTER — Other Ambulatory Visit (INDEPENDENT_AMBULATORY_CARE_PROVIDER_SITE_OTHER): Payer: Self-pay | Admitting: Bariatrics

## 2020-12-26 DIAGNOSIS — E8881 Metabolic syndrome: Secondary | ICD-10-CM

## 2020-12-26 NOTE — Telephone Encounter (Signed)
LAST APPOINTMENT DATE: 11/21/222 NEXT APPOINTMENT DATE: 01/01/21   [id number] [address], [place] AT [address] [place] [address] Phone: [phone] Fax: [phone]  Patient is requesting a refill of the following medications: Requested Prescriptions   Pending Prescriptions Disp Refills   metFORMIN (GLUCOPHAGE) 500 MG tablet 90 tablet 0    Sig: Take 1 tablet (500 mg total) by mouth daily with lunch.    Date last filled: 12/04/20 Previously prescribed by DR Owens Shark  Lab Results  Component Value Date   HGBA1C 5.5 10/24/2020   HGBA1C 5.6 05/22/2020   HGBA1C 5.5 08/09/2019   Lab Results  Component Value Date   LDLCALC 46 10/24/2020   CREATININE 0.69 10/24/2020   Lab Results  Component Value Date   VD25OH 51.2 10/24/2020   VD25OH 47.5 05/22/2020   VD25OH 37.1 02/14/2020    BP Readings from Last 3 Encounters:  12/04/20 (!) 150/86  11/13/20 (!) 171/101  11/13/20 (!) 172/110

## 2021-01-01 ENCOUNTER — Other Ambulatory Visit: Payer: Self-pay

## 2021-01-01 ENCOUNTER — Encounter (INDEPENDENT_AMBULATORY_CARE_PROVIDER_SITE_OTHER): Payer: Self-pay | Admitting: Family Medicine

## 2021-01-01 ENCOUNTER — Ambulatory Visit (INDEPENDENT_AMBULATORY_CARE_PROVIDER_SITE_OTHER): Payer: Managed Care, Other (non HMO) | Admitting: Family Medicine

## 2021-01-01 VITALS — BP 138/83 | HR 66 | Temp 98.7°F | Ht 66.0 in | Wt 218.0 lb

## 2021-01-01 DIAGNOSIS — E890 Postprocedural hypothyroidism: Secondary | ICD-10-CM

## 2021-01-01 DIAGNOSIS — E8881 Metabolic syndrome: Secondary | ICD-10-CM | POA: Diagnosis not present

## 2021-01-01 DIAGNOSIS — Z9189 Other specified personal risk factors, not elsewhere classified: Secondary | ICD-10-CM

## 2021-01-01 DIAGNOSIS — E559 Vitamin D deficiency, unspecified: Secondary | ICD-10-CM

## 2021-01-01 DIAGNOSIS — Z6836 Body mass index (BMI) 36.0-36.9, adult: Secondary | ICD-10-CM

## 2021-01-01 NOTE — Progress Notes (Signed)
° ° ° °Chief Complaint:  ° °OBESITY °Ruth Elliott is here to discuss her progress with her obesity treatment plan along with follow-up of her obesity related diagnoses. Ruth Elliott is on the Category 2 Plan and states she is following her eating plan approximately 85% of the time. Ruth Elliott states she is doing more walking.  ° °Today's visit was #: 25 °Starting weight: 223 lbs °Starting date: 08/30/2019 °Today's weight: 218 lbs °Today's date: 01/01/2021 °Total lbs lost to date: 5 °Total lbs lost since last in-office visit: 8 ° °Interim History: Ruth Elliott has done well with weight loss even over Thanksgiving. Her hunger is mostly controlled. ° °Subjective:  ° °1. Vitamin D deficiency °Ruth Elliott is on Vit D, and she is due to have labs checked. No side effects were noted. ° °2. Insulin resistance °Ruth Elliott is working on diet and exercise. She is on metformin but she never started Saxenda. She is doing well with weight loss. ° °3. S/P thyroidectomy °Ruth Elliott is status post thyroidectomy due to cystic enlargement. She is not on thyroid replacement and her last labs were within normal limits. She would like to have labs rechecked. ° °4. At risk for impaired metabolic function °Ruth Elliott is at increased risk for impaired metabolic function if calories or protein drops too low. ° °Assessment/Plan:  ° °1. Vitamin D deficiency °We will check labs today. Ruth Elliott will follow-up for routine testing of Vitamin D, at least 2-3 times per year to avoid over-replacement. ° °- VITAMIN D 25 Hydroxy (Vit-D Deficiency, Fractures) ° °2. Insulin resistance °Ruth Elliott will continue to work on weight loss, exercise, and decreasing simple carbohydrates to help decrease the risk of diabetes. We will check labs today. Ruth Elliott agreed to follow-up with us as directed to closely monitor her progress. ° °- CMP14+EGFR °- Vitamin B12 °- Insulin, random °- Hemoglobin A1c ° °3. S/P thyroidectomy °We will check labs today, and will follow up at Ruth Elliott's next office visit. ° °- TSH °- T4, free °-  T3 ° °4. At risk for impaired metabolic function °Ruth Elliott was given approximately 15 minutes of impaired  metabolic function prevention counseling today. We discussed intensive lifestyle modifications today with an emphasis on specific nutrition and exercise instructions and strategies.  ° °Repetitive spaced learning was employed today to elicit superior memory formation and behavioral change. ° °5. Obesity, current BMI 35.3 °Ruth Elliott is currently in the action stage of change. As such, her goal is to continue with weight loss efforts. She has agreed to the Category 2 Plan.  ° °Exercise goals: As is. ° °Behavioral modification strategies: increasing lean protein intake, meal planning and cooking strategies, and holiday eating strategies . ° °Ruth Elliott has agreed to follow-up with our clinic in 3 to 4 weeks. She was informed of the importance of frequent follow-up visits to maximize her success with intensive lifestyle modifications for her multiple health conditions.  ° °Ruth Elliott was informed we would discuss her lab results at her next visit unless there is a critical issue that needs to be addressed sooner. Ruth Elliott agreed to keep her next visit at the agreed upon time to discuss these results. ° °Objective:  ° °Blood pressure 138/83, pulse 66, temperature 98.7 °F (37.1 °C), height 5' 6" (1.676 m), weight 218 lb (98.9 kg), SpO2 99 %. °Body mass index is 35.19 kg/m². ° °General: Cooperative, alert, well developed, in no acute distress. °HEENT: Conjunctivae and lids unremarkable. °Cardiovascular: Regular rhythm.  °Lungs: Normal work of breathing. °Neurologic: No focal deficits.  ° °Lab Results  °Component   Value Date  ° CREATININE 0.69 10/24/2020  ° BUN 11 10/24/2020  ° NA 141 10/24/2020  ° K 4.1 10/24/2020  ° CL 101 10/24/2020  ° CO2 23 10/24/2020  ° °Lab Results  °Component Value Date  ° ALT 18 10/24/2020  ° AST 21 10/24/2020  ° ALKPHOS 67 10/24/2020  ° BILITOT 0.7 10/24/2020  ° °Lab Results  °Component Value Date  ° HGBA1C 5.5  10/24/2020  ° HGBA1C 5.6 05/22/2020  ° HGBA1C 5.5 08/09/2019  ° HGBA1C 5.3 09/28/2018  ° HGBA1C 5.4 08/18/2017  ° °Lab Results  °Component Value Date  ° INSULIN 15.2 10/24/2020  ° INSULIN 20.8 05/22/2020  ° INSULIN 16.2 02/14/2020  ° INSULIN 12.7 08/30/2019  ° °Lab Results  °Component Value Date  ° TSH 2.280 10/24/2020  ° °Lab Results  °Component Value Date  ° CHOL 110 10/24/2020  ° HDL 43 10/24/2020  ° LDLCALC 46 10/24/2020  ° TRIG 118 10/24/2020  ° CHOLHDL 2.5 08/09/2019  ° °Lab Results  °Component Value Date  ° VD25OH 51.2 10/24/2020  ° VD25OH 47.5 05/22/2020  ° VD25OH 37.1 02/14/2020  ° °Lab Results  °Component Value Date  ° WBC 6.8 08/09/2019  ° HGB 15.1 08/09/2019  ° HCT 42.3 08/09/2019  ° MCV 96 08/09/2019  ° PLT 172 08/09/2019  ° °No results found for: IRON, TIBC, FERRITIN ° °Attestation Statements:  ° °Reviewed by clinician on day of visit: allergies, medications, problem list, medical history, surgical history, family history, social history, and previous encounter notes. ° ° °I, Sharon Martin, am acting as transcriptionist for Caren Beasley, MD. ° °I have reviewed the above documentation for accuracy and completeness, and I agree with the above. -  Caren Beasley, MD ° ° °

## 2021-01-02 LAB — CMP14+EGFR
ALT: 18 IU/L (ref 0–32)
AST: 21 IU/L (ref 0–40)
Albumin/Globulin Ratio: 2.2 (ref 1.2–2.2)
Albumin: 4.4 g/dL (ref 3.8–4.9)
Alkaline Phosphatase: 64 IU/L (ref 44–121)
BUN/Creatinine Ratio: 18 (ref 9–23)
BUN: 12 mg/dL (ref 6–24)
Bilirubin Total: 0.5 mg/dL (ref 0.0–1.2)
CO2: 21 mmol/L (ref 20–29)
Calcium: 8.8 mg/dL (ref 8.7–10.2)
Chloride: 106 mmol/L (ref 96–106)
Creatinine, Ser: 0.66 mg/dL (ref 0.57–1.00)
Globulin, Total: 2 g/dL (ref 1.5–4.5)
Glucose: 91 mg/dL (ref 70–99)
Potassium: 4.5 mmol/L (ref 3.5–5.2)
Sodium: 141 mmol/L (ref 134–144)
Total Protein: 6.4 g/dL (ref 6.0–8.5)
eGFR: 106 mL/min/{1.73_m2} (ref >59)

## 2021-01-02 LAB — HEMOGLOBIN A1C
Est. average glucose Bld gHb Est-mCnc: 114 mg/dL
Hgb A1c MFr Bld: 5.6 % (ref 4.8–5.6)

## 2021-01-02 LAB — T4, FREE: Free T4: 1.32 ng/dL (ref 0.82–1.77)

## 2021-01-02 LAB — T3: T3, Total: 165 ng/dL (ref 71–180)

## 2021-01-02 LAB — VITAMIN B12: Vitamin B-12: 567 pg/mL (ref 232–1245)

## 2021-01-02 LAB — VITAMIN D 25 HYDROXY (VIT D DEFICIENCY, FRACTURES): Vit D, 25-Hydroxy: 48.8 ng/mL (ref 30.0–100.0)

## 2021-01-02 LAB — INSULIN, RANDOM: INSULIN: 13.4 u[IU]/mL (ref 2.6–24.9)

## 2021-01-02 LAB — TSH: TSH: 1.54 u[IU]/mL (ref 0.450–4.500)

## 2021-02-05 ENCOUNTER — Other Ambulatory Visit: Payer: Self-pay

## 2021-02-05 ENCOUNTER — Ambulatory Visit (INDEPENDENT_AMBULATORY_CARE_PROVIDER_SITE_OTHER): Payer: Managed Care, Other (non HMO) | Admitting: Family Medicine

## 2021-02-05 ENCOUNTER — Encounter (INDEPENDENT_AMBULATORY_CARE_PROVIDER_SITE_OTHER): Payer: Self-pay | Admitting: Family Medicine

## 2021-02-05 VITALS — BP 156/92 | HR 78 | Temp 98.6°F | Ht 66.0 in | Wt 218.0 lb

## 2021-02-05 DIAGNOSIS — Z6835 Body mass index (BMI) 35.0-35.9, adult: Secondary | ICD-10-CM

## 2021-02-05 DIAGNOSIS — E669 Obesity, unspecified: Secondary | ICD-10-CM

## 2021-02-05 DIAGNOSIS — I10 Essential (primary) hypertension: Secondary | ICD-10-CM | POA: Diagnosis not present

## 2021-02-05 NOTE — Progress Notes (Signed)
Chief Complaint:   OBESITY Tammie is here to discuss her progress with her obesity treatment plan along with follow-up of her obesity related diagnoses. Adley is on the Category 2 Plan and states she is following her eating plan approximately 50-70% of the time. Karis states she is at the gym, exercising for 30-35 minutes 5 times per week.  Today's visit was #: 50 Starting weight: 223 lbs Starting date: 08/30/2019 Today's weight: 218 lbs Today's date: 02/05/2021 Total lbs lost to date: 5 Total lbs lost since last in-office visit: 0  Interim History: Sharlyne has done well with maintaining hejr weight over the holidays. She has had increased stress at work ad she been missing meals due to this.   Subjective:   1. Essential hypertension Emma's blood pressure is elevated today in the office. She states it is better at home. She continues to work on diet and exercise to help improve her blood pressure.  Assessment/Plan:   1. Essential hypertension Offie will continue her blood pressure medications, and will continue diet, exercise, and increase her water intake to improve blood pressure control. We will continue to follow.  2. Obesity, current BMI 35.2 Aisa is currently in the action stage of change. As such, her goal is to continue with weight loss efforts. She has agreed to the Category 2 Plan.   Exercise goals: As is.  Behavioral modification strategies: increasing lean protein intake and increasing water intake.  Carly has agreed to follow-up with our clinic in 4 weeks. She was informed of the importance of frequent follow-up visits to maximize her success with intensive lifestyle modifications for her multiple health conditions.   Objective:   Blood pressure (!) 156/92, pulse 78, temperature 98.6 F (37 C), height 5\' 6"  (1.676 m), weight 218 lb (98.9 kg), SpO2 99 %. Body mass index is 35.19 kg/m.  General: Cooperative, alert, well developed, in no acute distress. HEENT:  Conjunctivae and lids unremarkable. Cardiovascular: Regular rhythm.  Lungs: Normal work of breathing. Neurologic: No focal deficits.   Lab Results  Component Value Date   CREATININE 0.66 01/01/2021   BUN 12 01/01/2021   NA 141 01/01/2021   K 4.5 01/01/2021   CL 106 01/01/2021   CO2 21 01/01/2021   Lab Results  Component Value Date   ALT 18 01/01/2021   AST 21 01/01/2021   ALKPHOS 64 01/01/2021   BILITOT 0.5 01/01/2021   Lab Results  Component Value Date   HGBA1C 5.6 01/01/2021   HGBA1C 5.5 10/24/2020   HGBA1C 5.6 05/22/2020   HGBA1C 5.5 08/09/2019   HGBA1C 5.3 09/28/2018   Lab Results  Component Value Date   INSULIN 13.4 01/01/2021   INSULIN 15.2 10/24/2020   INSULIN 20.8 05/22/2020   INSULIN 16.2 02/14/2020   INSULIN 12.7 08/30/2019   Lab Results  Component Value Date   TSH 1.540 01/01/2021   Lab Results  Component Value Date   CHOL 110 10/24/2020   HDL 43 10/24/2020   LDLCALC 46 10/24/2020   TRIG 118 10/24/2020   CHOLHDL 2.5 08/09/2019   Lab Results  Component Value Date   VD25OH 48.8 01/01/2021   VD25OH 51.2 10/24/2020   VD25OH 47.5 05/22/2020   Lab Results  Component Value Date   WBC 6.8 08/09/2019   HGB 15.1 08/09/2019   HCT 42.3 08/09/2019   MCV 96 08/09/2019   PLT 172 08/09/2019   No results found for: IRON, TIBC, FERRITIN  Attestation Statements:   Reviewed by clinician  on day of visit: allergies, medications, problem list, medical history, surgical history, family history, social history, and previous encounter notes.  Time spent on visit including pre-visit chart review and post-visit care and charting was 32 minutes.    I, Trixie Dredge, am acting as transcriptionist for Dennard Nip, MD.  I have reviewed the above documentation for accuracy and completeness, and I agree with the above. -  Dennard Nip, MD

## 2021-03-01 ENCOUNTER — Ambulatory Visit (INDEPENDENT_AMBULATORY_CARE_PROVIDER_SITE_OTHER): Payer: Managed Care, Other (non HMO) | Admitting: Family Medicine

## 2021-03-01 ENCOUNTER — Other Ambulatory Visit: Payer: Self-pay

## 2021-03-01 ENCOUNTER — Encounter (INDEPENDENT_AMBULATORY_CARE_PROVIDER_SITE_OTHER): Payer: Self-pay | Admitting: Family Medicine

## 2021-03-01 VITALS — BP 135/87 | HR 70 | Temp 98.3°F | Ht 66.0 in | Wt 217.0 lb

## 2021-03-01 DIAGNOSIS — I10 Essential (primary) hypertension: Secondary | ICD-10-CM

## 2021-03-01 DIAGNOSIS — E559 Vitamin D deficiency, unspecified: Secondary | ICD-10-CM

## 2021-03-01 DIAGNOSIS — Z6835 Body mass index (BMI) 35.0-35.9, adult: Secondary | ICD-10-CM

## 2021-03-01 DIAGNOSIS — E8881 Metabolic syndrome: Secondary | ICD-10-CM

## 2021-03-01 DIAGNOSIS — R0602 Shortness of breath: Secondary | ICD-10-CM

## 2021-03-01 DIAGNOSIS — E669 Obesity, unspecified: Secondary | ICD-10-CM

## 2021-03-01 DIAGNOSIS — Z9189 Other specified personal risk factors, not elsewhere classified: Secondary | ICD-10-CM

## 2021-03-01 MED ORDER — METFORMIN HCL 500 MG PO TABS: 500.0000 mg | ORAL_TABLET | Freq: Every day | ORAL | 0 refills | Status: DC

## 2021-03-01 NOTE — Progress Notes (Signed)
Chief Complaint:   OBESITY Ruth Elliott is here to discuss her progress with her obesity treatment plan along with follow-up of her obesity related diagnoses. Ruth Elliott is on the Category 2 Plan and states she is following her eating plan approximately 60% of the time. Ruth Elliott states she is walking riding for 30-45 minutes 3 times per week.  Today's visit was #: 27 Starting weight: 223 lbs Starting date: 08/30/2019 Today's weight: 217 lbs Today's date: 03/01/2021 Total lbs lost to date: 6 Total lbs lost since last in-office visit: 1  Interim History: Mikinzie continues to work on diet and weight loss. She has had extra challenges with meal planning due to renovations in her home, but this has improved now.  Subjective:   1. Vitamin D deficiency Ruth Elliott is on Vit D, and her last Vit D level had dropped below goal.   2. Insulin resistance Ruth Elliott is working on decreasing simple carbohydrates. She is due for labs.  3. Essential hypertension Ruth Elliott's blood pressure is stable on her diet, weight loss, and medications. She has no signs of hypotension.   4. SOB (shortness of breath) on exertion Ruth Elliott is working on diet and exercise, and she is due for her VO2 recheck.   5. At risk for impaired metabolic function Ruth Elliott is at increased risk for impaired metabolic function if calories or protein are too low.  Assessment/Plan:   1. Vitamin D deficiency Low Vitamin D level contributes to fatigue and are associated with obesity, breast, and colon cancer. We will check labs today. Ruth Elliott will follow-up for routine testing of Vitamin D, at least 2-3 times per year to avoid over-replacement.  - VITAMIN D 25 Hydroxy (Vit-D Deficiency, Fractures)  2. Insulin resistance Ruth Elliott will continue to work on weight loss, exercise, and decreasing simple carbohydrates to help decrease the risk of diabetes. We will check labs today., and we will refill metformin for 90 days with no refills. Ruth Elliott agreed to follow-up with  [name] as directed to closely monitor her progress.  - metFORMIN (GLUCOPHAGE) 500 MG tablet; Take 1 tablet (500 mg total) by mouth daily with lunch.  Dispense: 90 tablet; Refill: 0 - Insulin, random - Hemoglobin A1c  3. Essential hypertension Ruth Elliott will continue with diet and exercise to improve blood pressure control. We will check labs today, and will follow up at her next visit.  - CMP14+EGFR - Lipid Panel With LDL/HDL Ratio  4. SOB (shortness of breath) on exertion Ruth Elliott was done today, and we will follow up at her next office visit.   5. At risk for impaired metabolic function Ruth Elliott was given approximately 15 minutes of impaired  metabolic function prevention counseling today. We discussed intensive lifestyle modifications today with an emphasis on specific nutrition and exercise instructions and strategies.   Repetitive spaced learning was employed today to elicit superior memory formation and behavioral change.  6. Obesity, current BMI 35.1 Ruth Elliott is currently in the action stage of change. As such, her goal is to continue with weight loss efforts. She has agreed to the Category 3 Plan.   Exercise goals: As is.  Behavioral modification strategies: increasing lean protein intake and no skipping meals.  Ruth Elliott has agreed to follow-up with our clinic in 3 weeks. She was informed of the importance of frequent follow-up visits to maximize her success with intensive lifestyle modifications for her multiple health conditions.   Ruth Elliott was informed we would discuss her lab results at her next visit unless there is a  issue that needs to be addressed sooner. Ruth Elliott agreed to keep her next visit at the agreed upon time to discuss these results. ° °Objective:  ° °Blood pressure 135/87, pulse 70, temperature 98.3 °F (36.8 °C), height 5' 6" (1.676 m), weight 217 lb (98.4 kg), SpO2 99 %. °Body mass index is 35.02 kg/m². ° °General: Cooperative, alert, well developed, in no acute distress. °HEENT:  Conjunctivae and lids unremarkable. °Cardiovascular: Regular rhythm.  °Lungs: Normal work of breathing. °Neurologic: No focal deficits.  ° °Lab Results  °Component Value Date  ° CREATININE 0.66 01/01/2021  ° BUN 12 01/01/2021  ° NA 141 01/01/2021  ° K 4.5 01/01/2021  ° CL 106 01/01/2021  ° CO2 21 01/01/2021  ° °Lab Results  °Component Value Date  ° ALT 18 01/01/2021  ° AST 21 01/01/2021  ° ALKPHOS 64 01/01/2021  ° BILITOT 0.5 01/01/2021  ° °Lab Results  °Component Value Date  ° HGBA1C 5.6 01/01/2021  ° HGBA1C 5.5 10/24/2020  ° HGBA1C 5.6 05/22/2020  ° HGBA1C 5.5 08/09/2019  ° HGBA1C 5.3 09/28/2018  ° °Lab Results  °Component Value Date  ° INSULIN 13.4 01/01/2021  ° INSULIN 15.2 10/24/2020  ° INSULIN 20.8 05/22/2020  ° INSULIN 16.2 02/14/2020  ° INSULIN 12.7 08/30/2019  ° °Lab Results  °Component Value Date  ° TSH 1.540 01/01/2021  ° °Lab Results  °Component Value Date  ° CHOL 110 10/24/2020  ° HDL 43 10/24/2020  ° LDLCALC 46 10/24/2020  ° TRIG 118 10/24/2020  ° CHOLHDL 2.5 08/09/2019  ° °Lab Results  °Component Value Date  ° VD25OH 48.8 01/01/2021  ° VD25OH 51.2 10/24/2020  ° VD25OH 47.5 05/22/2020  ° °Lab Results  °Component Value Date  ° WBC 6.8 08/09/2019  ° HGB 15.1 08/09/2019  ° HCT 42.3 08/09/2019  ° MCV 96 08/09/2019  ° PLT 172 08/09/2019  ° °No results found for: IRON, TIBC, FERRITIN ° °Attestation Statements:  ° °Reviewed by clinician on day of visit: allergies, medications, problem list, medical history, surgical history, family history, social history, and previous encounter notes. ° ° °I, Sharon Martin, am acting as transcriptionist for Caren Beasley, MD. ° °I have reviewed the above documentation for accuracy and completeness, and I agree with the above. -  Caren Beasley, MD ° ° °

## 2021-03-02 LAB — CMP14+EGFR
ALT: 17 IU/L (ref 0–32)
AST: 20 IU/L (ref 0–40)
Albumin/Globulin Ratio: 1.9 (ref 1.2–2.2)
Albumin: 4.5 g/dL (ref 3.8–4.9)
Alkaline Phosphatase: 60 IU/L (ref 44–121)
BUN/Creatinine Ratio: 19 (ref 9–23)
BUN: 14 mg/dL (ref 6–24)
Bilirubin Total: 0.8 mg/dL (ref 0.0–1.2)
CO2: 22 mmol/L (ref 20–29)
Calcium: 9 mg/dL (ref 8.7–10.2)
Chloride: 104 mmol/L (ref 96–106)
Creatinine, Ser: 0.72 mg/dL (ref 0.57–1.00)
Globulin, Total: 2.4 g/dL (ref 1.5–4.5)
Glucose: 79 mg/dL (ref 70–99)
Potassium: 4.5 mmol/L (ref 3.5–5.2)
Sodium: 142 mmol/L (ref 134–144)
Total Protein: 6.9 g/dL (ref 6.0–8.5)
eGFR: 101 mL/min/{1.73_m2} (ref >59)

## 2021-03-02 LAB — VITAMIN D 25 HYDROXY (VIT D DEFICIENCY, FRACTURES): Vit D, 25-Hydroxy: 48.5 ng/mL (ref 30.0–100.0)

## 2021-03-02 LAB — HEMOGLOBIN A1C
Est. average glucose Bld gHb Est-mCnc: 111 mg/dL
Hgb A1c MFr Bld: 5.5 % (ref 4.8–5.6)

## 2021-03-02 LAB — LIPID PANEL WITH LDL/HDL RATIO
Cholesterol, Total: 106 mg/dL (ref 100–199)
HDL: 37 mg/dL — ABNORMAL LOW (ref >39)
LDL Chol Calc (NIH): 48 mg/dL (ref 0–99)
LDL/HDL Ratio: 1.3 ratio (ref 0.0–3.2)
Triglycerides: 118 mg/dL (ref 0–149)
VLDL Cholesterol Cal: 21 mg/dL (ref 5–40)

## 2021-03-02 LAB — INSULIN, RANDOM: INSULIN: 8.2 u[IU]/mL (ref 2.6–24.9)

## 2021-03-16 ENCOUNTER — Encounter: Payer: Self-pay | Admitting: Family

## 2021-03-16 ENCOUNTER — Ambulatory Visit (INDEPENDENT_AMBULATORY_CARE_PROVIDER_SITE_OTHER)
Admission: RE | Admit: 2021-03-16 | Discharge: 2021-03-16 | Disposition: A | Discharge status: Home / Self Care | Payer: Managed Care, Other (non HMO) | Source: Ambulatory Visit | Attending: Family | Admitting: Family

## 2021-03-16 ENCOUNTER — Other Ambulatory Visit: Payer: Self-pay

## 2021-03-16 ENCOUNTER — Ambulatory Visit: Payer: Managed Care, Other (non HMO) | Admitting: Family

## 2021-03-16 VITALS — BP 142/98 | HR 86 | Ht 66.0 in | Wt 227.0 lb

## 2021-03-16 DIAGNOSIS — E6609 Other obesity due to excess calories: Secondary | ICD-10-CM

## 2021-03-16 DIAGNOSIS — M79644 Pain in right finger(s): Secondary | ICD-10-CM | POA: Diagnosis not present

## 2021-03-16 DIAGNOSIS — Z6836 Body mass index (BMI) 36.0-36.9, adult: Secondary | ICD-10-CM

## 2021-03-16 DIAGNOSIS — J301 Allergic rhinitis due to pollen: Secondary | ICD-10-CM

## 2021-03-16 DIAGNOSIS — E88819 Insulin resistance, unspecified: Secondary | ICD-10-CM | POA: Insufficient documentation

## 2021-03-16 DIAGNOSIS — Z6835 Body mass index (BMI) 35.0-35.9, adult: Secondary | ICD-10-CM

## 2021-03-16 DIAGNOSIS — E8881 Metabolic syndrome: Secondary | ICD-10-CM | POA: Diagnosis not present

## 2021-03-16 DIAGNOSIS — I10 Essential (primary) hypertension: Secondary | ICD-10-CM | POA: Diagnosis not present

## 2021-03-16 MED ORDER — VALSARTAN 160 MG PO TABS: ORAL_TABLET | ORAL | 3 refills | Status: DC

## 2021-03-16 NOTE — Assessment & Plan Note (Signed)
Patient to continue with weight loss clinic as well as work on diet and exercise as tolerated ?

## 2021-03-16 NOTE — Assessment & Plan Note (Addendum)
Pt advised of the following:  ?Continue medication as prescribed. Monitor blood pressure periodically and/or when you feel symptomatic. Goal is <130/90 on average. Ensure that you have rested for 30 minutes prior to checking your blood pressure. Record your readings and bring them to your next visit if necessary.work on a low sodium diet. ? ?Also f/u with cardiologist annually.  ? ?

## 2021-03-16 NOTE — Progress Notes (Signed)
New Patient Office Visit  Subjective:  Patient ID: Ruth Elliott, female    DOB: 1970/07/11  Age: 51 y.o. MRN: [id number]  CC:  Chief Complaint  Patient presents with   New Patient (Initial Visit)   Hand Pain    Pt stated--right hand thumb area sore- 4 months    HPI Ruth Elliott is here to establish care as a new patient.  Prior provider was: Ruth Fells, NP, Dr Ruth Elliott prior to that Pt is without acute concerns.   Healthy weight and wellness: followed by Dr. Dennard Nip, doing well increasing her protein and more calorie conscious.   Acute concerns: had to write out 640 slots for her work and repetitive use of her right thumb on the inner palm. Intermittent inflammation and flare ups, she doesn't have decreased grip. But will throb especially when working with her horses hard for fine finger movement.   chronic concerns:  HTN: doing well, usually lower in the office visit, always goes up while seeing the doctor. Average per pt is 120/80 , seen by cardiologist Dr. Rockey Situ annually as well. Initially sent to him for cardiomegaly but cardiologist states ok. Taking amlodipine 5 mg once daily and now taking 1/3 of 160 mg tablet once daily and doig well.   Goiter: prior resection of cyst and left side of followed,  was followed by endocrine but released.   Insulin resistance: started on metformin 50 mg once daily insulin decreased .   Lab Results  Component Value Date   HGBA1C 5.5 03/01/2021     Past Medical History:  Diagnosis Date   Allergic rhinitis    Allergy    Back pain    Endometriosis    Followed by [facility]- on Nortrel   Joint pain    Obesity    Thyroid disease    White coat syndrome with hypertension     Past Surgical History:  Procedure Laterality Date   APPENDECTOMY     NASAL SINUS SURGERY     OVARIAN CYST SURGERY     THYROIDECTOMY Left 11/17/2018   Procedure: THYROIDECTOMY-Hemi Thyroidectomy;  Surgeon: Beverly Gust, MD;  Location:  [facility] ORS;  Service: ENT;  Laterality: Left;   TONSILLECTOMY  1994    Family History  Problem Relation Age of Onset   COPD Mother    Hypertension Mother    Diabetes Mother    Heart disease Mother    Alzheimer's disease Father    Hypertension Father     Social History   Socioeconomic History   Marital status: Single    Spouse name: Not on file   Number of children: Not on file   Years of education: Not on file   Highest education level: Not on file  Occupational History   Occupation: IT sales professional supvervisor  Tobacco Use   Smoking status: Never   Smokeless tobacco: Never  Vaping Use   Vaping Use: Never used  Substance and Sexual Activity   Alcohol use: Yes    Alcohol/week: 0.0 standard drinks    Comment: 1-2 per month beer   Drug use: No   Sexual activity: Not Currently  Other Topics Concern   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  Recently moved here from [place].   G0   Single , lives alone, 2 dogs and 2 horses    Social Determinants of Health   Financial Resource Strain: Not on file  Food Insecurity: Not  on file  Transportation Needs: Not on file  Physical Activity: Not on file  Stress: Not on file  Social Connections: Not on file  Intimate Partner Violence: Not on file    Outpatient Medications Prior to Visit  Medication Sig Dispense Refill   amLODipine (NORVASC) 5 MG tablet TAKE 1 TABLET(5 MG) BY MOUTH DAILY 90 tablet 0   cetirizine (ZYRTEC) 10 MG chewable tablet Chew 10 mg by mouth daily.     cholecalciferol (VITAMIN D3) 25 MCG (1000 UNIT) tablet Take 1,000 Units by mouth daily.     COLLAGEN PO Take 1 Scoop by mouth daily. Collagen Peptide in coffee in am     Flax Oil-Fish Oil-Borage Oil (FISH OIL-FLAX OIL-BORAGE OIL) CAPS Take 2 capsules by mouth daily.      fluticasone (FLONASE) 50 MCG/ACT nasal spray SHAKE LIQUID AND USE 2 SPRAYS IN EACH NOSTRIL DAILY 16 g 1   ibuprofen (ADVIL) 400 MG tablet Take  400 mg by mouth every 6 (six) hours as needed.     metFORMIN (GLUCOPHAGE) 500 MG tablet Take 1 tablet (500 mg total) by mouth daily with lunch. 90 tablet 0   Multiple Vitamin (MULTIVITAMIN) capsule Take 1 capsule by mouth daily.     norethindrone-ethinyl estradiol (MICROGESTIN,JUNEL,LOESTRIN) 1-20 MG-MCG tablet Take 1 tablet by mouth daily.  0   valsartan (DIOVAN) 160 MG tablet Take 1 tablet (160 mg total) by mouth daily. 90 tablet 3   No facility-administered medications prior to visit.    Allergies  Allergen Reactions   Penicillins Shortness Of Breath    Did it involve swelling of the face/tongue/throat, SOB, or low BP? Yes Did it involve sudden or severe rash/hives, skin peeling, or any reaction on the inside of your mouth or nose? No Did you need to seek medical attention at a hospital or doctor's office? Yes When did it last happen?   childhood    If all above answers are NO, may proceed with cephalosporin use.    Hydralazine     Elevated blood pressure, migraine, and lethary   Lisinopril     paresthesia   Singulair [Montelukast Sodium]     Moody    ROS Review of Systems  Review of Systems  Respiratory:  Negative for shortness of breath.   Cardiovascular:  Negative for chest pain and palpitations.  Gastrointestinal:  Negative for constipation and diarrhea.  Genitourinary:  Negative for dysuria, frequency and urgency.  Musculoskeletal:  right thumb tenderness with some swelling for weeks, no decreased strength or grip, hard to do fine movements sometimes   Psychiatric/Behavioral:  Negative for depression and suicidal ideas.   All other systems reviewed and are negative.    Objective:    Physical Exam Vitals reviewed.  Constitutional:      General: She is not in acute distress.    Appearance: Normal appearance. She is obese. She is not ill-appearing or toxic-appearing.  HENT:     Head: Normocephalic.  Cardiovascular:     Rate and Rhythm: Normal rate and regular  rhythm.  Pulmonary:     Effort: Pulmonary effort is normal.  Musculoskeletal:     Right wrist: Normal. No tenderness or snuff box tenderness. Normal range of motion.     Left wrist: Normal. No tenderness or snuff box tenderness. Normal range of motion.     Right hand: Swelling (see figure one) and tenderness present. Normal range of motion. Normal strength (anterior base of 1st metacarpal).     Left hand: Normal. Normal  strength.       Arms:  Neurological:     General: No focal deficit present.     Mental Status: She is alert and oriented to person, place, and time.  Psychiatric:        Mood and Affect: Mood normal.        Behavior: Behavior normal.        Thought Content: Thought content normal.        Judgment: Judgment normal.    BP (!) 142/98    Pulse 86    Ht 5' 6" (1.676 m)    Wt 227 lb (103 kg)    SpO2 96%    BMI 36.64 kg/m  Wt Readings from Last 3 Encounters:  03/16/21 227 lb (103 kg)  03/01/21 217 lb (98.4 kg)  02/05/21 218 lb (98.9 kg)     Health Maintenance Due  Topic Date Due   Zoster Vaccines- Shingrix (1 of 2) Never done   COLONOSCOPY (Pts 45-20yr Insurance coverage will need to be confirmed)  Never done   COVID-19 Vaccine (4 - Booster for [place]) 12/19/2019   PAP SMEAR-Modifier  03/23/2020   MAMMOGRAM  06/14/2020   INFLUENZA VACCINE  08/14/2020    There are no preventive care reminders to display for this patient.  Lab Results  Component Value Date   TSH 1.540 01/01/2021   Lab Results  Component Value Date   WBC 6.8 08/09/2019   HGB 15.1 08/09/2019   HCT 42.3 08/09/2019   MCV 96 08/09/2019   PLT 172 08/09/2019   Lab Results  Component Value Date   NA 142 03/01/2021   K 4.5 03/01/2021   CO2 22 03/01/2021   GLUCOSE 79 03/01/2021   BUN 14 03/01/2021   CREATININE 0.72 03/01/2021   BILITOT 0.8 03/01/2021   ALKPHOS 60 03/01/2021   AST 20 03/01/2021   ALT 17 03/01/2021   PROT 6.9 03/01/2021   ALBUMIN 4.5 03/01/2021   CALCIUM 9.0  03/01/2021   EGFR 101 03/01/2021   Lab Results  Component Value Date   CHOL 106 03/01/2021   Lab Results  Component Value Date   HDL 37 (L) 03/01/2021   Lab Results  Component Value Date   LDLCALC 48 03/01/2021   Lab Results  Component Value Date   TRIG 118 03/01/2021   Lab Results  Component Value Date   CHOLHDL 2.5 08/09/2019   Lab Results  Component Value Date   HGBA1C 5.5 03/01/2021      Assessment & Plan:   Problem List Items Addressed This Visit       Cardiovascular and Mediastinum   Essential hypertension    Pt advised of the following:  Continue medication as prescribed. Monitor blood pressure periodically and/or when you feel symptomatic. Goal is <130/90 on average. Ensure that you have rested for 30 minutes prior to checking your blood pressure. Record your readings and bring them to your next visit if necessary.work on a low sodium diet.  Also f/u with cardiologist annually.        Relevant Medications   valsartan (DIOVAN) 160 MG tablet     Respiratory   Allergic rhinitis    Continue Flonase and Zyrtec        Endocrine   Insulin resistance - Primary    Continue metformin 500 mg once daily continue follow-up with the weight and wellness clinic exercise as tolerated         Other   Class 2 obesity due to  excess calories with body mass index (BMI) of 35.0 to 35.9 in adult    Continue with weight loss clinic exercise as tolerated      Adult BMI 36.0-36.9 kg/sq m    Patient to continue with weight loss clinic as well as work on diet and exercise as tolerated      Thumb pain, right    Suspected overuse as well as possible arthritis naproxen/ibuprofen as needed for inflammation Recommended to wear neck brace at night which may help with needed rest from overuse Ice and heat alternatively to see if improvement X-ray of the right hand today       Relevant Orders   DG Hand Complete Right    Meds ordered this encounter  Medications    valsartan (DIOVAN) 160 MG tablet    Sig: Take 1/3 tablet po qd    Dispense:  90 tablet    Refill:  3    Order Specific Question:   Supervising Provider    Answer:   Diona Browner, AMY E [[id number]]    Follow-up: Return in about 6 months (around 09/16/2021) for follow up for CPE exam, full physical.    Eugenia Pancoast, FNP

## 2021-03-16 NOTE — Assessment & Plan Note (Signed)
Continue Flonase and Zyrtec. 

## 2021-03-16 NOTE — Assessment & Plan Note (Signed)
Continue with weight loss clinic exercise as tolerated ?

## 2021-03-16 NOTE — Assessment & Plan Note (Signed)
Suspected overuse as well as possible arthritis naproxen/ibuprofen as needed for inflammation ?Recommended to wear neck brace at night which may help with needed rest from overuse ?Ice and heat alternatively to see if improvement ?X-ray of the right hand today ? ?

## 2021-03-16 NOTE — Assessment & Plan Note (Signed)
Continue metformin 500 mg once daily continue follow-up with the weight and wellness clinic exercise as tolerated ? ?

## 2021-03-16 NOTE — Patient Instructions (Signed)
Complete xray(s) prior to leaving today. I will notify you of your results once received. ? ?It was a pleasure seeing you today! Please do not hesitate to reach out with any questions and or concerns. ? ?Regards,  ? ?Ruger Saxer ?FNP-C ? ? ?

## 2021-03-28 ENCOUNTER — Ambulatory Visit (INDEPENDENT_AMBULATORY_CARE_PROVIDER_SITE_OTHER): Payer: Managed Care, Other (non HMO) | Admitting: Family Medicine

## 2021-03-28 ENCOUNTER — Other Ambulatory Visit: Payer: Self-pay

## 2021-03-28 ENCOUNTER — Encounter (INDEPENDENT_AMBULATORY_CARE_PROVIDER_SITE_OTHER): Payer: Self-pay | Admitting: Family Medicine

## 2021-03-28 VITALS — BP 154/90 | HR 70 | Temp 98.0°F | Ht 66.0 in | Wt 221.0 lb

## 2021-03-28 DIAGNOSIS — I10 Essential (primary) hypertension: Secondary | ICD-10-CM | POA: Diagnosis not present

## 2021-03-28 DIAGNOSIS — R7303 Prediabetes: Secondary | ICD-10-CM

## 2021-03-28 DIAGNOSIS — Z9189 Other specified personal risk factors, not elsewhere classified: Secondary | ICD-10-CM

## 2021-03-28 DIAGNOSIS — E669 Obesity, unspecified: Secondary | ICD-10-CM

## 2021-03-28 DIAGNOSIS — Z6835 Body mass index (BMI) 35.0-35.9, adult: Secondary | ICD-10-CM

## 2021-03-28 MED ORDER — METFORMIN HCL 500 MG PO TABS: 500.0000 mg | ORAL_TABLET | Freq: Every day | ORAL | 0 refills | Status: DC

## 2021-04-02 NOTE — Progress Notes (Signed)
? ? ? ?Chief Complaint:  ? ?OBESITY ?Ruth Elliott is here to discuss her progress with her obesity treatment plan along with follow-up of her obesity related diagnoses. Ruth Elliott is on the Category 3 Plan and states she is following her eating plan approximately 70% of the time. Ruth Elliott states she is walking and horse riding for 60 minutes 7 times per week. ? ?Today's visit was #: 28 ?Starting weight: 223 lbs ?Starting date: 08/30/2019 ?Today's weight: 221 lbs ?Today's date: 03/28/2021 ?Total lbs lost to date: 2 ?Total lbs lost since last in-office visit: 0 ? ?Interim History: Ruth Elliott is struggling to eat all of the food on her plan, especially dinner. She is open to looking at other eating plan options.  ? ?Subjective:  ? ?1. Pre-diabetes ?Ruth Elliott is working on diet and exercise. She is struggling to eat all of the food on her plan, but she is doing well with decreasing simple carbohydrates. ? ?2. Essential hypertension ?Ruth Elliott's diastolic blood pressure is mildly elevated. She notes traffic was challenging today. ? ?3. At risk for impaired metabolic function ?Ruth Elliott is at increased risk for impaired metabolic function if protein or calories decreases.  ? ?Assessment/Plan:  ? ?1. Pre-diabetes ?We will refill metformin for 90 days with no refills. Wilba will continue to work on weight loss, exercise, and decreasing simple carbohydrates to help decrease the risk of diabetes.  ? ?- metFORMIN (GLUCOPHAGE) 500 MG tablet; Take 1 tablet (500 mg total) by mouth daily with lunch.  Dispense: 90 tablet; Refill: 0 ? ?2. Essential hypertension ?Ruth Elliott will continue her diet, exercise, and medications to improve blood pressure control. We will recheck her blood pressure in 3 weeks. ? ?3. At risk for impaired metabolic function ?Ruth Elliott was given approximately 15 minutes of impaired  metabolic function prevention counseling today. We discussed intensive lifestyle modifications today with an emphasis on specific nutrition and exercise instructions and  strategies.  ? ?Repetitive spaced learning was employed today to elicit superior memory formation and behavioral change. ? ?4. Obesity with the current BMI of 35.7 ?Ruth Elliott is currently in the action stage of change. As such, her goal is to continue with weight loss efforts. She has agreed to change to keeping a food journal and adhering to recommended goals of 1400-1600 calories and 90+ grams of protein daily.  ? ?Exercise goals: As is. ? ?Behavioral modification strategies: increasing lean protein intake and meal planning and cooking strategies. ? ?Ruth Elliott has agreed to follow-up with our clinic in 3 weeks. She was informed of the importance of frequent follow-up visits to maximize her success with intensive lifestyle modifications for her multiple health conditions.  ? ?Objective:  ? ?Blood pressure (!) 154/90, pulse 70, temperature 98 ?F (36.7 ?C), height '5\' 6"'$  (1.676 m), weight 221 lb (100.2 kg), SpO2 98 %. ?Body mass index is 35.67 kg/m?. ? ?General: Cooperative, alert, well developed, in no acute distress. ?HEENT: Conjunctivae and lids unremarkable. ?Cardiovascular: Regular rhythm.  ?Lungs: Normal work of breathing. ?Neurologic: No focal deficits.  ? ?Lab Results  ?Component Value Date  ? CREATININE 0.72 03/01/2021  ? BUN 14 03/01/2021  ? NA 142 03/01/2021  ? K 4.5 03/01/2021  ? CL 104 03/01/2021  ? CO2 22 03/01/2021  ? ?Lab Results  ?Component Value Date  ? ALT 17 03/01/2021  ? AST 20 03/01/2021  ? ALKPHOS 60 03/01/2021  ? BILITOT 0.8 03/01/2021  ? ?Lab Results  ?Component Value Date  ? HGBA1C 5.5 03/01/2021  ? HGBA1C 5.6 01/01/2021  ? HGBA1C  5.5 10/24/2020  ? HGBA1C 5.6 05/22/2020  ? HGBA1C 5.5 08/09/2019  ? ?Lab Results  ?Component Value Date  ? INSULIN 8.2 03/01/2021  ? INSULIN 13.4 01/01/2021  ? INSULIN 15.2 10/24/2020  ? INSULIN 20.8 05/22/2020  ? INSULIN 16.2 02/14/2020  ? ?Lab Results  ?Component Value Date  ? TSH 1.540 01/01/2021  ? ?Lab Results  ?Component Value Date  ? CHOL 106 03/01/2021  ? HDL 37 (L)  03/01/2021  ? [place] 48 03/01/2021  ? TRIG 118 03/01/2021  ? CHOLHDL 2.5 08/09/2019  ? ?Lab Results  ?Component Value Date  ? VD25OH 48.5 03/01/2021  ? VD25OH 48.8 01/01/2021  ? VD25OH 51.2 10/24/2020  ? ?Lab Results  ?Component Value Date  ? WBC 6.8 08/09/2019  ? HGB 15.1 08/09/2019  ? HCT 42.3 08/09/2019  ? MCV 96 08/09/2019  ? PLT 172 08/09/2019  ? ?No results found for: IRON, TIBC, FERRITIN ? ?Attestation Statements:  ? ?Reviewed by clinician on day of visit: allergies, medications, problem list, medical history, surgical history, family history, social history, and previous encounter notes. ? ? ?I, Trixie Dredge, am acting as transcriptionist for Dennard Nip, MD. ? ?I have reviewed the above documentation for accuracy and completeness, and I agree with the above. -  Dennard Nip, MD ? ? ?

## 2021-04-19 ENCOUNTER — Ambulatory Visit (INDEPENDENT_AMBULATORY_CARE_PROVIDER_SITE_OTHER): Payer: Managed Care, Other (non HMO) | Admitting: Family Medicine

## 2021-05-07 ENCOUNTER — Encounter (INDEPENDENT_AMBULATORY_CARE_PROVIDER_SITE_OTHER): Payer: Self-pay | Admitting: Family Medicine

## 2021-05-07 ENCOUNTER — Ambulatory Visit (INDEPENDENT_AMBULATORY_CARE_PROVIDER_SITE_OTHER): Payer: Managed Care, Other (non HMO) | Admitting: Family Medicine

## 2021-05-07 VITALS — BP 154/90 | HR 69 | Temp 98.2°F | Ht 66.0 in | Wt 220.0 lb

## 2021-05-07 DIAGNOSIS — M25561 Pain in right knee: Secondary | ICD-10-CM

## 2021-05-07 DIAGNOSIS — Z6835 Body mass index (BMI) 35.0-35.9, adult: Secondary | ICD-10-CM

## 2021-05-07 DIAGNOSIS — I10 Essential (primary) hypertension: Secondary | ICD-10-CM | POA: Diagnosis not present

## 2021-05-07 DIAGNOSIS — Z9189 Other specified personal risk factors, not elsewhere classified: Secondary | ICD-10-CM

## 2021-05-07 DIAGNOSIS — E669 Obesity, unspecified: Secondary | ICD-10-CM

## 2021-05-09 ENCOUNTER — Ambulatory Visit (INDEPENDENT_AMBULATORY_CARE_PROVIDER_SITE_OTHER): Payer: Managed Care, Other (non HMO)

## 2021-05-09 ENCOUNTER — Ambulatory Visit: Payer: Self-pay

## 2021-05-09 ENCOUNTER — Ambulatory Visit: Payer: Managed Care, Other (non HMO) | Admitting: Family Medicine

## 2021-05-09 VITALS — BP 184/104 | HR 78 | Ht 66.0 in | Wt 223.6 lb

## 2021-05-09 DIAGNOSIS — G8929 Other chronic pain: Secondary | ICD-10-CM | POA: Diagnosis not present

## 2021-05-09 DIAGNOSIS — M25561 Pain in right knee: Secondary | ICD-10-CM | POA: Diagnosis not present

## 2021-05-09 NOTE — Patient Instructions (Addendum)
Thank you for coming in today.  ? ?Please get an Xray today before you leave  ? ?Please use Voltaren gel (Generic Diclofenac Gel) up to 4x daily for pain as needed.  This is available over-the-counter as both the name brand Voltaren gel and the generic diclofenac gel.  ? ?Recheck in 8 weeks.  ? ? ?

## 2021-05-09 NOTE — Progress Notes (Signed)
? ?  I, Peterson Lombard, LAT, ATC acting as a scribe for Lynne Leader, MD. ? ?Subjective:   ? ?CC: R knee pain ? ?HPI: Pt is a 51 y/o female c/o R knee pain that began 2 years ago. Last year she started wearing different mud boots in the horse barn which irritated. Pt was told by a chiropractor that he knee was "coming of alignment." Pt describes the pain a "tight ache." Pt locates pain to anterior- lateral aspect. ? ?R knee swelling: yes- intermittently ?Mechanical symptoms: yes- along the lateral joint line ?Aggravates: horse riding ?Treatments tried: ice, infrared pad ? ?Pertinent review of Systems: no fever or chills ? ?Relevant historical information: HTN ? ? ?Objective:   ? ?Vitals:  ? 05/09/21 1347  ?BP: (!) 184/104  ?Pulse: 78  ?SpO2: 97%  ? ?General: Well Developed, well nourished, and in no acute distress.  ? ?MSK: Right knee: Normal appearing.  ?Normal motion  ?Mild TTP lateral knee.  ?Stable Ligament exam.  ?Negative Murray's test. ?Intact strength. ? ?Lab and Radiology Results ? ?Diagnostic Limited MSK Ultrasound of: Right knee ?Quad tendon normal appearing. Minimal effusion.  ?Patella tendon normal appearing.  ?Lateral joint line mild degenerative changes at lateral meniscus. ?Medial joint line mild DJD. ?Impression: DJD ? ? 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  No acute fractures. ?Await formal radiology ? ? ?Impression and Recommendations:   ? ?Assessment and Plan: ?51 y.o. female with Right knee pain thought to be due to exacerbation of DJD.   ?Plan for physical therapy especially for quad strengthening.  Also recommend Voltaren gel.  Recheck in about 6 weeks.  Consider steroid injection if needed.  Return sooner if needed. ? ?PDMP not reviewed this encounter. ?Orders Placed This Encounter  ?Procedures  ? [name] LIMITED JOINT SPACE STRUCTURES LOW RIGHT(NO LINKED CHARGES)  ?  Order Specific Question:   Reason  for Exam (SYMPTOM  OR DIAGNOSIS REQUIRED)  ?  Answer:   right knee pain  ?  Order Specific Question:   Preferred imaging location?  ?  Answer:   [place]  ? DG Knee AP/LAT W/[place] Right  ?  Standing Status:   Future  ?  Number of Occurrences:   1  ?  Standing Expiration Date:   06/08/2021  ?  Order Specific Question:   Reason for Exam (SYMPTOM  OR DIAGNOSIS REQUIRED)  ?  Answer:   right knee pain  ?  Order Specific Question:   Preferred imaging location?  ?  Answer:   [facility]  ?  Order Specific Question:   Is patient pregnant?  ?  Answer:   No  ? Ambulatory referral to Physical Therapy  ?  Referral Priority:   Routine  ?  Referral Type:   Physical Medicine  ?  Referral Reason:   Specialty Services Required  ?  Requested Specialty:   Physical Therapy  ?  Number of Visits Requested:   1  ? ?No orders of the defined types were placed in this encounter. ? ? ?Discussed warning signs or symptoms. Please see discharge instructions. Patient expresses understanding. ? ? ?The above documentation has been reviewed and is accurate and complete Lynne Leader, M.D. ? ?

## 2021-05-11 NOTE — Progress Notes (Signed)
Right knee x-ray shows some arthritis changes

## 2021-05-14 ENCOUNTER — Ambulatory Visit: Payer: Managed Care, Other (non HMO) | Admitting: Family

## 2021-05-17 NOTE — Progress Notes (Signed)
? ? ? ?Chief Complaint:  ? ?OBESITY ?Ruth Elliott is here to discuss her progress with her obesity treatment plan along with follow-up of her obesity related diagnoses. Ruth Elliott is on keeping a food journal and adhering to recommended goals of 1400-1600 calories and 90+ grams of protein daily and states she is following her eating plan approximately 75% of the time. Ruth Elliott states she is walking the dog for 20 minutes 4 times per week. ? ?Today's visit was #: 75 ?Starting weight: 223 lbs ?Starting date: 08/30/2019 ?Today's weight: 220 lbs ?Today's date: 05/07/2021 ?Total lbs lost to date: 3 ?Total lbs lost since last in-office visit: 1 ? ?Interim History: Ruth Elliott continues to work on her diet and weight loss. When her stress is high she struggles to meet her protein goals.  ? ?Subjective:  ? ?1. Essential hypertension ?Corynne's blood pressure remains elevated. Her home blood pressure's range at 12/70's. She is followed by Cardiology. ? ?2. Right knee pain, unspecified chronicity ?Ruth Elliott has increased right knee pain, both behind her knee and lateral. She feels it is malaligned. Pain is worse on the bike and treadmill.  ? ?3. At risk for heart disease ?Ruth Elliott is at higher than average risk for cardiovascular disease due to obesity. ? ?Assessment/Plan:  ? ?1. Essential hypertension ?Keyah is to continue to check her blood pressure at home. She will continue with her diet and exercise to help decrease her blood pressure.  ? ?2. Right knee pain, unspecified chronicity ?We will refer to Dr. Georgina Snell. Sherelle will continue to work on her weight loss. ? ?- Ambulatory referral to Sports Medicine ? ?3. At risk for heart disease ?Aariona was given approximately 15 minutes of coronary artery disease prevention counseling today. She is 51 y.o. female and has risk factors for heart disease including obesity. We discussed intensive lifestyle modifications today with an emphasis on specific weight loss instructions and strategies. ? ?Repetitive spaced  learning was employed today to elicit superior memory formation and behavioral change.  ? ?4. Obesity with the current BMI of 35.5 ?Ruth Elliott is currently in the action stage of change. As such, her goal is to continue with weight loss efforts. She has agreed to the Category 3 Plan.  ? ?Exercise goals: As is. ? ?Behavioral modification strategies: increasing lean protein intake. ? ?Ruth Elliott has agreed to follow-up with our clinic in 4 weeks. She was informed of the importance of frequent follow-up visits to maximize her success with intensive lifestyle modifications for her multiple health conditions.  ? ?Objective:  ? ?Blood pressure (!) 154/90, pulse 69, temperature 98.2 ?F (36.8 ?C), height '5\' 6"'$  (1.676 m), weight 220 lb (99.8 kg), SpO2 97 %. ?Body mass index is 35.51 kg/m?. ? ?General: Cooperative, alert, well developed, in no acute distress. ?HEENT: Conjunctivae and lids unremarkable. ?Cardiovascular: Regular rhythm.  ?Lungs: Normal work of breathing. ?Neurologic: No focal deficits.  ? ?Lab Results  ?Component Value Date  ? CREATININE 0.72 03/01/2021  ? BUN 14 03/01/2021  ? NA 142 03/01/2021  ? K 4.5 03/01/2021  ? CL 104 03/01/2021  ? CO2 22 03/01/2021  ? ?Lab Results  ?Component Value Date  ? ALT 17 03/01/2021  ? AST 20 03/01/2021  ? ALKPHOS 60 03/01/2021  ? BILITOT 0.8 03/01/2021  ? ?Lab Results  ?Component Value Date  ? HGBA1C 5.5 03/01/2021  ? HGBA1C 5.6 01/01/2021  ? HGBA1C 5.5 10/24/2020  ? HGBA1C 5.6 05/22/2020  ? HGBA1C 5.5 08/09/2019  ? ?Lab Results  ?Component Value Date  ?  INSULIN 8.2 03/01/2021  ? INSULIN 13.4 01/01/2021  ? INSULIN 15.2 10/24/2020  ? INSULIN 20.8 05/22/2020  ? INSULIN 16.2 02/14/2020  ? ?Lab Results  ?Component Value Date  ? TSH 1.540 01/01/2021  ? ?Lab Results  ?Component Value Date  ? CHOL 106 03/01/2021  ? HDL 37 (L) 03/01/2021  ? [place] 48 03/01/2021  ? TRIG 118 03/01/2021  ? CHOLHDL 2.5 08/09/2019  ? ?Lab Results  ?Component Value Date  ? VD25OH 48.5 03/01/2021  ? VD25OH 48.8  01/01/2021  ? VD25OH 51.2 10/24/2020  ? ?Lab Results  ?Component Value Date  ? WBC 6.8 08/09/2019  ? HGB 15.1 08/09/2019  ? HCT 42.3 08/09/2019  ? MCV 96 08/09/2019  ? PLT 172 08/09/2019  ? ?No results found for: IRON, TIBC, FERRITIN ? ?Attestation Statements:  ? ?Reviewed by clinician on day of visit: allergies, medications, problem list, medical history, surgical history, family history, social history, and previous encounter notes. ? ? ?I, Trixie Dredge, am acting as transcriptionist for Dennard Nip, MD. ? ?I have reviewed the above documentation for accuracy and completeness, and I agree with the above. -  Dennard Nip, MD ? ? ?

## 2021-05-21 ENCOUNTER — Ambulatory Visit: Payer: Managed Care, Other (non HMO) | Admitting: Dermatology

## 2021-05-21 DIAGNOSIS — L719 Rosacea, unspecified: Secondary | ICD-10-CM

## 2021-05-21 DIAGNOSIS — Z1283 Encounter for screening for malignant neoplasm of skin: Secondary | ICD-10-CM | POA: Diagnosis not present

## 2021-05-21 DIAGNOSIS — L720 Epidermal cyst: Secondary | ICD-10-CM

## 2021-05-21 DIAGNOSIS — D229 Melanocytic nevi, unspecified: Secondary | ICD-10-CM

## 2021-05-21 DIAGNOSIS — D1800 Hemangioma unspecified site: Secondary | ICD-10-CM

## 2021-05-21 DIAGNOSIS — L578 Other skin changes due to chronic exposure to nonionizing radiation: Secondary | ICD-10-CM

## 2021-05-21 DIAGNOSIS — L814 Other melanin hyperpigmentation: Secondary | ICD-10-CM

## 2021-05-21 DIAGNOSIS — L738 Other specified follicular disorders: Secondary | ICD-10-CM

## 2021-05-21 DIAGNOSIS — L821 Other seborrheic keratosis: Secondary | ICD-10-CM

## 2021-05-21 DIAGNOSIS — D2371 Other benign neoplasm of skin of right lower limb, including hip: Secondary | ICD-10-CM

## 2021-05-21 NOTE — Patient Instructions (Signed)
Melanoma ABCDEs ? ?Melanoma is the most dangerous type of skin cancer, and is the leading cause of death from skin disease.  You are more likely to develop melanoma if you: ?Have light-colored skin, light-colored eyes, or red or blond hair ?Spend a lot of time in the sun ?Tan regularly, either outdoors or in a tanning bed ?Have had blistering sunburns, especially during childhood ?Have a close family member who has had a melanoma ?Have atypical moles or large birthmarks ? ?Early detection of melanoma is key since treatment is typically straightforward and cure rates are extremely high if we catch it early.  ? ?The first sign of melanoma is often a change in a mole or a new dark spot.  The ABCDE system is a way of remembering the signs of melanoma. ? ?A for asymmetry:  The two halves do not match. ?B for border:  The edges of the growth are irregular. ?C for color:  A mixture of colors are present instead of an even brown color. ?D for diameter:  Melanomas are usually (but not always) greater than 6mm - the size of a pencil eraser. ?E for evolution:  The spot keeps changing in size, shape, and color. ? ?Please check your skin once per month between visits. You can use a small mirror in front and a large mirror behind you to keep an eye on the back side or your body.  ? ?If you see any new or changing lesions before your next follow-up, please call to schedule a visit. ? ?Please continue daily skin protection including broad spectrum sunscreen SPF 30+ to sun-exposed areas, reapplying every 2 hours as needed when you're outdoors.  ? ?Staying in the shade or wearing long sleeves, sun glasses (UVA+UVB protection) and wide brim hats (4-inch brim around the entire circumference of the hat) are also recommended for sun protection.   ? ? ?If You Need Anything After Your Visit ? ?If you have any questions or concerns for your doctor, please call our main line at [phone] and press option 4 to reach your doctor's medical  assistant. If no one answers, please leave a voicemail as directed and we will return your call as soon as possible. Messages left after 4 pm will be answered the following business day.  ? ?You may also send us a message via MyChart. We typically respond to MyChart messages within 1-2 business days. ? ?For prescription refills, please ask your pharmacy to contact our office. Our fax number is [phone]. ? ?If you have an urgent issue when the clinic is closed that cannot wait until the next business day, you can page your doctor at the number below.   ? ?Please note that while we do our best to be available for urgent issues outside of office hours, we are not available 24/7.  ? ?If you have an urgent issue and are unable to reach us, you may choose to seek medical care at your doctor's office, retail clinic, urgent care center, or emergency room. ? ?If you have a medical emergency, please immediately call 911 or go to the emergency department. ? ?Pager Numbers ? ?- Dr. Kowalski: [phone] ? ?- Dr. Moye: [phone] ? ?- Dr. Stewart: [phone] ? ?In the event of inclement weather, please call our main line at [phone] for an update on the status of any delays or closures. ? ?Dermatology Medication Tips: ?Please keep the boxes that topical medications come in in order to help keep track of the instructions   about where and how to use these. Pharmacies typically print the medication instructions only on the boxes and not directly on the medication tubes.  ? ?If your medication is too expensive, please contact our office at [phone] option 4 or send us a message through MyChart.  ? ?We are unable to tell what your co-pay for medications will be in advance as this is different depending on your insurance coverage. However, we may be able to find a substitute medication at lower cost or fill out paperwork to get insurance to cover a needed medication.  ? ?If a prior authorization is required to get your  medication covered by your insurance company, please allow us 1-2 business days to complete this process. ? ?Drug prices often vary depending on where the prescription is filled and some pharmacies may offer cheaper prices. ? ?The website www.goodrx.com contains coupons for medications through different pharmacies. The prices here do not account for what the cost may be with help from insurance (it may be cheaper with your insurance), but the website can give you the price if you did not use any insurance.  ?- You can print the associated coupon and take it with your prescription to the pharmacy.  ?- You may also stop by our office during regular business hours and pick up a GoodRx coupon card.  ?- If you need your prescription sent electronically to a different pharmacy, notify our office through [facility] MyChart or by phone at [phone] option 4. ? ? ? ? ?Si Usted Necesita Algo Despu?s de Su Visita ? ?Tambi?n puede enviarnos un mensaje a trav?s de MyChart. Por lo general respondemos a los mensajes de MyChart en el transcurso de 1 a 2 d?as h?biles. ? ?Para renovar recetas, por favor pida a su farmacia que se ponga en contacto con nuestra oficina. Nuestro n?mero de fax es el [phone]. ? ?Si tiene un asunto urgente cuando la cl?nica est? cerrada y que no puede esperar hasta el siguiente d?a h?bil, puede llamar/localizar a su doctor(a) al n?mero que aparece a continuaci?n.  ? ?Por favor, tenga en cuenta que aunque hacemos todo lo posible para estar disponibles para asuntos urgentes fuera del horario de oficina, no estamos disponibles las 24 horas del d?a, los 7 d?as de la semana.  ? ?Si tiene un problema urgente y no puede comunicarse con nosotros, puede optar por buscar atenci?n m?dica  en el consultorio de su doctor(a), en una cl?nica privada, en un centro de atenci?n urgente o en una sala de emergencias. ? ?Si tiene una emergencia m?dica, por favor llame inmediatamente al 911 o vaya a la sala de  emergencias. ? ?N?meros de b?per ? ?- Dr. Kowalski: [phone] ? ?- Dra. Moye: [phone] ? ?- Dra. Stewart: [phone] ? ?En caso de inclemencias del tiempo, por favor llame a nuestra l?nea principal al [phone] para una actualizaci?n sobre el estado de cualquier retraso o cierre. ? ?Consejos para la medicaci?n en dermatolog?a: ?Por favor, guarde las cajas en las que vienen los medicamentos de uso t?pico para ayudarle a seguir las instrucciones sobre d?nde y c?mo usarlos. Las farmacias generalmente imprimen las instrucciones del medicamento s?lo en las cajas y no directamente en los tubos del medicamento.  ? ?Si su medicamento es muy caro, por favor, p?ngase en contacto con nuestra oficina llamando al [phone] y presione la opci?n 4 o env?enos un mensaje a trav?s de MyChart.  ? ?No podemos decirle cu?l ser? su copago por los medicamentos por adelantado ya que   esto es diferente dependiendo de la cobertura de su seguro. Sin embargo, es posible que podamos encontrar un medicamento sustituto a menor costo o llenar un formulario para que el seguro cubra el medicamento que se considera necesario.  ? ?Si se requiere una autorizaci?n previa para que su compa??a de seguros cubra su medicamento, por favor perm?tanos de 1 a 2 d?as h?biles para completar este proceso. ? ?Los precios de los medicamentos var?an con frecuencia dependiendo del lugar de d?nde se surte la receta y alguna farmacias pueden ofrecer precios m?s baratos. ? ?El sitio web www.goodrx.com tiene cupones para medicamentos de diferentes farmacias. Los precios aqu? no tienen en cuenta lo que podr?a costar con la ayuda del seguro (puede ser m?s barato con su seguro), pero el sitio web puede darle el precio si no utiliz? ning?n seguro.  ?- Puede imprimir el cup?n correspondiente y llevarlo con su receta a la farmacia.  ?- Tambi?n puede pasar por nuestra oficina durante el horario de atenci?n regular y recoger una tarjeta de cupones de GoodRx.  ?- Si  necesita que su receta se env?e electr?nicamente a una farmacia diferente, informe a nuestra oficina a trav?s de MyChart de [place] o por tel?fono llamando al [phone] y presione la opci?n 4. ? ?

## 2021-05-21 NOTE — Progress Notes (Signed)
Follow-Up Visit   Subjective  Ruth Elliott is a 51 y.o. female who presents for the following: Annual Exam (1 year tbse. Hx of rosacea, epidermal inclusion cyst and dermatofibroma. Denies hx of skin cancer. ). The patient presents for Total-Body Skin Exam (TBSE) for skin cancer screening and mole check.  The patient has spots, moles and lesions to be evaluated, some may be new or changing and the patient has concerns that these could be cancer.  The following portions of the chart were reviewed this encounter and updated as appropriate:  Tobacco  Allergies  Meds  Problems  Med Hx  Surg Hx  Fam Hx     Review of Systems: No other skin or systemic complaints except as noted in HPI or Assessment and Plan.  Objective  Well appearing patient in no apparent distress; mood and affect are within normal limits.  A full examination was performed including scalp, head, eyes, ears, nose, lips, neck, chest, axillae, abdomen, back, buttocks, bilateral upper extremities, bilateral lower extremities, hands, feet, fingers, toes, fingernails, and toenails. All findings within normal limits unless otherwise noted below.  face pinkness face  right infrascapular 0.5 cm subcutaneous nodule.   Right Axilla Speckled macules at right axilla    Assessment & Plan  Rosacea face Rosacea is a chronic progressive skin condition usually affecting the face of adults, causing redness and/or acne bumps. It is treatable but not curable. It sometimes affects the eyes (ocular rosacea) as well. It may respond to topical and/or systemic medication and can flare with stress, sun exposure, alcohol, exercise and some foods.  Daily application of broad spectrum spf 30+ sunscreen to face is recommended to reduce flares.  Discussed the treatment option of BBL/laser.  Typically we recommend 1-3 treatment sessions about 5-8 weeks apart for best results.  The patient's condition may require "maintenance treatments" in  the future.  The fee for BBL / laser treatments is $350 per treatment session for the whole face.  A fee can be quoted for other parts of the body. Insurance typically does not pay for BBL/laser treatments and therefore the fee is an out-of-pocket cost.  Epidermal inclusion cyst right infrascapular Benign-appearing. Exam most consistent with an epidermal inclusion cyst. Discussed that a cyst is a benign growth that can grow over time and sometimes get irritated or inflamed. Recommend observation if it is not bothersome. Discussed option of surgical excision to remove it if it is growing, symptomatic, or other changes noted. Please call for new or changing lesions so they can be evaluated.  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  Observation.  Call clinic for new or changing lesions.  Recommend daily use of broad spectrum spf 30+ sunscreen to sun-exposed areas.   Skin cancer screening  Lentigines - Scattered tan macules - Due to sun exposure - Benign-appearing, observe - Recommend daily broad spectrum sunscreen SPF 30+ to sun-exposed areas, reapply every 2 hours as needed. - Call for any changes  Seborrheic Keratoses - Stuck-on, waxy, tan-brown papules and/or plaques  - Benign-appearing - Discussed benign etiology and prognosis. - Observe - Call for any changes  Melanocytic Nevi - Tan-brown and/or pink-flesh-colored symmetric macules and papules - Benign appearing on exam today - Observation - Call clinic for new or changing moles - Recommend daily use of broad spectrum spf 30+ sunscreen to sun-exposed areas.   Dermatofibroma - Firm pink/brown papulenodule with dimple sign right thigh  - Benign  appearing - Call for any changes  Hemangiomas - Red papules - Discussed benign nature - Observe - Call for any changes  Actinic Damage - Chronic condition, secondary to cumulative UV/sun exposure - diffuse scaly  erythematous macules with underlying dyspigmentation - Recommend daily broad spectrum sunscreen SPF 30+ to sun-exposed areas, reapply every 2 hours as needed.  - Staying in the shade or wearing long sleeves, sun glasses (UVA+UVB protection) and wide brim hats (4-inch brim around the entire circumference of the hat) are also recommended for sun protection.  - Call for new or changing lesions.  Skin cancer screening performed today. Return in about 1 year (around 05/22/2022) for TBSE. IRuthell Rummage, CMA, am acting as scribe for Sarina Ser, MD. Documentation: I have reviewed the above documentation for accuracy and completeness, and I agree with the above.  Sarina Ser, MD

## 2021-06-04 ENCOUNTER — Ambulatory Visit (INDEPENDENT_AMBULATORY_CARE_PROVIDER_SITE_OTHER): Payer: Managed Care, Other (non HMO) | Admitting: Family Medicine

## 2021-06-04 ENCOUNTER — Encounter (INDEPENDENT_AMBULATORY_CARE_PROVIDER_SITE_OTHER): Payer: Self-pay | Admitting: Family Medicine

## 2021-06-04 VITALS — BP 156/87 | HR 70 | Temp 97.8°F | Ht 66.0 in | Wt 222.0 lb

## 2021-06-04 DIAGNOSIS — Z6835 Body mass index (BMI) 35.0-35.9, adult: Secondary | ICD-10-CM | POA: Diagnosis not present

## 2021-06-04 DIAGNOSIS — R7303 Prediabetes: Secondary | ICD-10-CM | POA: Insufficient documentation

## 2021-06-04 DIAGNOSIS — R030 Elevated blood-pressure reading, without diagnosis of hypertension: Secondary | ICD-10-CM | POA: Insufficient documentation

## 2021-06-04 DIAGNOSIS — Z9189 Other specified personal risk factors, not elsewhere classified: Secondary | ICD-10-CM | POA: Insufficient documentation

## 2021-06-04 DIAGNOSIS — Z7984 Long term (current) use of oral hypoglycemic drugs: Secondary | ICD-10-CM

## 2021-06-04 DIAGNOSIS — E669 Obesity, unspecified: Secondary | ICD-10-CM

## 2021-06-04 MED ORDER — METFORMIN HCL 500 MG PO TABS: 500.0000 mg | ORAL_TABLET | Freq: Every day | ORAL | 0 refills | Status: DC

## 2021-06-05 ENCOUNTER — Encounter: Payer: Self-pay | Admitting: Dermatology

## 2021-06-06 ENCOUNTER — Encounter: Payer: Self-pay | Admitting: Family

## 2021-06-06 ENCOUNTER — Ambulatory Visit (INDEPENDENT_AMBULATORY_CARE_PROVIDER_SITE_OTHER): Payer: Managed Care, Other (non HMO) | Admitting: Family

## 2021-06-06 VITALS — BP 144/90 | HR 73 | Temp 98.7°F | Resp 16 | Ht 66.0 in | Wt 227.2 lb

## 2021-06-06 DIAGNOSIS — L309 Dermatitis, unspecified: Secondary | ICD-10-CM | POA: Insufficient documentation

## 2021-06-06 NOTE — Assessment & Plan Note (Signed)
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  Try these methods, if no improvement consider vascular

## 2021-06-06 NOTE — Progress Notes (Signed)
Established Patient Office Visit  Subjective:  Patient ID: Ruth Elliott, female    DOB: 02-Feb-1970  Age: 51 y.o. MRN: [id number]  CC:  Chief Complaint  Patient presents with   Rash    Around ankle gets in the summer the barn.    HPI Ruth Elliott is here today with concerns.   In the summer keeps getting rashes around her ankles. In the summer she rides horses and and long pants for riding, with chaps- she thinks this may be irritating her legs from sweating in the chaps and boots.   Always after she rides and with heat down by her ankles she notices the rash. At other times typically slightly brown freckle like rash around the ankles, right after riding she notices redness to rash.      06/06/2021    8:32 AM 08/09/2019   10:26 AM  GAD 7 : Generalized Anxiety Score  Nervous, Anxious, on Edge 0 0  Control/stop worrying 0 0  Worry too much - different things 0 0  Trouble relaxing 0 0  Restless 0 0  Easily annoyed or irritable 0 0  Afraid - awful might happen 0 0  Total GAD 7 Score 0 0  Anxiety Difficulty Not difficult at all Not difficult at all       06/06/2021    8:32 AM 08/30/2019    7:46 AM 08/09/2019   10:25 AM  PHQ9 SCORE ONLY  PHQ-9 Total Score 0 1 1     Past Medical History:  Diagnosis Date   Allergic rhinitis    Allergy    Back pain    Endometriosis    Followed by [facility]- on Nortrel   Joint pain    Obesity    Thyroid disease    White coat syndrome with hypertension     Past Surgical History:  Procedure Laterality Date   APPENDECTOMY     NASAL SINUS SURGERY     OVARIAN CYST SURGERY     THYROIDECTOMY Left 11/17/2018   Procedure: THYROIDECTOMY-Hemi Thyroidectomy;  Surgeon: Beverly Gust, MD;  Location: [facility] ORS;  Service: ENT;  Laterality: Left;   TONSILLECTOMY  1994    Family History  Problem Relation Age of Onset   COPD Mother    Hypertension Mother    Diabetes Mother    Heart disease Mother    Alzheimer's disease Father     Hypertension Father     Social History   Socioeconomic History   Marital status: Single    Spouse name: Not on file   Number of children: Not on file   Years of education: Not on file   Highest education level: Not on file  Occupational History   Occupation: IT sales professional supvervisor  Tobacco Use   Smoking status: Never   Smokeless tobacco: Never  Vaping Use   Vaping Use: Never used  Substance and Sexual Activity   Alcohol use: Yes    Alcohol/week: 0.0 standard drinks    Comment: 1-2 per month beer   Drug use: No   Sexual activity: Not Currently  Other Topics Concern   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  Recently moved here from [place].   G0   Single , lives alone, 2 dogs and 2 horses    Social Determinants of Health   Financial Resource Strain: Not on file  Food Insecurity: Not on file  Transportation Needs: Not on file  Physical  Activity: Not on file  Stress: Not on file  Social Connections: Not on file  Intimate Partner Violence: Not on file    Outpatient Medications Prior to Visit  Medication Sig Dispense Refill   amLODipine (NORVASC) 5 MG tablet TAKE 1 TABLET(5 MG) BY MOUTH DAILY 90 tablet 0   B Complex-C-Folic Acid (HM SUPER VITAMIN B COMPLEX/C PO) Take by mouth.     cetirizine (ZYRTEC) 10 MG chewable tablet Chew 10 mg by mouth daily.     cholecalciferol (VITAMIN D3) 25 MCG (1000 UNIT) tablet Take 1,000 Units by mouth daily.     COLLAGEN PO Take 1 Scoop by mouth daily. Collagen Peptide in coffee in am     Flax Oil-Fish Oil-Borage Oil (FISH OIL-FLAX OIL-BORAGE OIL) CAPS Take 2 capsules by mouth daily.      fluticasone (FLONASE) 50 MCG/ACT nasal spray SHAKE LIQUID AND USE 2 SPRAYS IN EACH NOSTRIL DAILY 16 g 1   ibuprofen (ADVIL) 400 MG tablet Take 400 mg by mouth every 6 (six) hours as needed.     metFORMIN (GLUCOPHAGE) 500 MG tablet Take 1 tablet (500 mg total) by mouth daily with lunch. 90 tablet 0    Multiple Vitamin (MULTIVITAMIN) capsule Take 1 capsule by mouth daily.     norethindrone-ethinyl estradiol (MICROGESTIN,JUNEL,LOESTRIN) 1-20 MG-MCG tablet Take 1 tablet by mouth daily.  0   valsartan (DIOVAN) 160 MG tablet Take 1/3 tablet po qd 90 tablet 3   No facility-administered medications prior to visit.    Allergies  Allergen Reactions   Penicillins Shortness Of Breath    Did it involve swelling of the face/tongue/throat, SOB, or low BP? Yes Did it involve sudden or severe rash/hives, skin peeling, or any reaction on the inside of your mouth or nose? No Did you need to seek medical attention at a hospital or doctor's office? Yes When did it last happen?   childhood    If all above answers are "NO", may proceed with cephalosporin use.    Hydralazine     Elevated blood pressure, migraine, and lethary   Lisinopril     paresthesia   Singulair [Montelukast Sodium]     Moody    ROS Review of Systems  Constitutional:  Negative for chills, fatigue and fever.  Cardiovascular:  Negative for chest pain, palpitations and leg swelling.  Skin:  Positive for rash (bil le rash with brownish discoloration).     Objective:    Physical Exam Constitutional:      General: She is not in acute distress.    Appearance: Normal appearance. She is obese. She is not ill-appearing, toxic-appearing or diaphoretic.  Cardiovascular:     Pulses: Normal pulses.  Pulmonary:     Effort: Pulmonary effort is normal.  Musculoskeletal:     Right lower leg: No edema.     Left lower leg: No edema.  Skin:    Comments: Very slight bil le brown discoloration  Neurological:     General: No focal deficit present.     Mental Status: She is alert and oriented to person, place, and time. Mental status is at baseline.  Psychiatric:        Mood and Affect: Mood normal.        Behavior: Behavior normal.        Thought Content: Thought content normal.        Judgment: Judgment normal.    BP (!) 144/90    Pulse 73   Temp 98.7 F (37.1 C)  Resp 16   Ht _0  (1.676 m)   Wt 227 lb 3 oz (103.1 kg)   SpO2 97%   BMI 36.67 kg/m  Wt Readings from Last 3 Encounters:  06/06/21 227 lb 3 oz (103.1 kg)  06/04/21 222 lb (100.7 kg)  05/09/21 223 lb 9.6 oz (101.4 kg)     Health Maintenance Due  Topic Date Due   Zoster Vaccines- Shingrix (1 of 2) Never done   COLONOSCOPY (Pts 45-61yr Insurance coverage will need to be confirmed)  Never done   COVID-19 Vaccine (4 - Booster for PWilmotseries) 12/19/2019   PAP SMEAR-Modifier  03/23/2020   MAMMOGRAM  06/14/2020    There are no preventive care reminders to display for this patient.  Lab Results  Component Value Date   TSH 1.540 01/01/2021   Lab Results  Component Value Date   WBC 6.8 08/09/2019   HGB 15.1 08/09/2019   HCT 42.3 08/09/2019   MCV 96 08/09/2019   PLT 172 08/09/2019   Lab Results  Component Value Date   NA 142 03/01/2021   K 4.5 03/01/2021   CO2 22 03/01/2021   GLUCOSE 79 03/01/2021   BUN 14 03/01/2021   CREATININE 0.72 03/01/2021   BILITOT 0.8 03/01/2021   ALKPHOS 60 03/01/2021   AST 20 03/01/2021   ALT 17 03/01/2021   PROT 6.9 03/01/2021   ALBUMIN 4.5 03/01/2021   CALCIUM 9.0 03/01/2021   EGFR 101 03/01/2021   Lab Results  Component Value Date   HGBA1C 5.5 03/01/2021      Assessment & Plan:   Problem List Items Addressed This Visit       Musculoskeletal and Integument   Dermatitis of lower extremity - Primary    Suspected possible venous dermatitis Suspected worsened with tight boots and socks, advised pt to wear compression after riding and elevate legs prn  No pain with walking or intermittent claudification Pulses good on physical exam.  Try these methods, if no improvement consider vascular         No orders of the defined types were placed in this encounter.   Follow-up: No follow-ups on file.    TEugenia Pancoast FNP

## 2021-06-18 NOTE — Progress Notes (Signed)
Chief Complaint:   OBESITY Ruth Elliott is here to discuss her progress with her obesity treatment plan along with follow-up of her obesity related diagnoses. Ruth Elliott is on the Category 3 Plan and states she is following her eating plan approximately 60% of the time. Ruth Elliott states she is on the treadmill and training for 30 minutes 4 times per week.  Today's visit was #: 48 Starting weight: 223 lbs Starting date: 08/30/2019 Today's weight: 222 lbs Today's date: 06/04/2021 Total lbs lost to date: 1 Total lbs lost since last in-office visit: 0  Interim History: Ruth Elliott has been skipping meals more often due to her hectic work schedule. She is retaining some fluid today as well. She is open to changing up her eating plan.   Subjective:   1. Pre-diabetes Ruth Elliott stable on metformin, and she is due for a refill.   2. Elevated BP without diagnosis of hypertension Liya's blood pressure is elevated today, but normally controlled.   3. At risk for heart disease Ruth Elliott is at higher than average risk for cardiovascular disease due to obesity.  Assessment/Plan:   1. Pre-diabetes We will refill metformin for 90 days with no refills. Ruth Elliott will continue to work on weight loss, exercise, and decreasing simple carbohydrates to help decrease the risk of diabetes.   - metFORMIN (GLUCOPHAGE) 500 MG tablet; Take 1 tablet (500 mg total) by mouth daily with lunch.  Dispense: 90 tablet; Refill: 0  2. Elevated BP without diagnosis of hypertension We will recheck Ruth Elliott's blood pressure in 4 weeks. She will continue with her diet and exercise.  3. At risk for heart disease Ruth Elliott was given approximately 15 minutes of coronary artery disease prevention counseling today. She is 51 y.o. female and has risk factors for heart disease including obesity. We discussed intensive lifestyle modifications today with an emphasis on specific weight loss instructions and strategies.  Repetitive spaced learning was employed  today to elicit superior memory formation and behavioral change.   4. Obesity with current BMI of 35.9 Ruth Elliott is currently in the action stage of change. As such, her goal is to continue with weight loss efforts. She has agreed to keeping a food journal and adhering to recommended goals of 1500 calories and 100+ grams of protein daily.   Exercise goals: As is.   Behavioral modification strategies: increasing lean protein intake and meal planning and cooking strategies.  Ruth Elliott has agreed to follow-up with our clinic in 4 weeks. She was informed of the importance of frequent follow-up visits to maximize her success with intensive lifestyle modifications for her multiple health conditions.   Objective:   Blood pressure (!) 156/87, pulse 70, temperature 97.8 F (36.6 C), height '5\' 6"'$  (1.676 m), weight 222 lb (100.7 kg), SpO2 97 %. Body mass index is 35.83 kg/m.  General: Cooperative, alert, well developed, in no acute distress. HEENT: Conjunctivae and lids unremarkable. Cardiovascular: Regular rhythm.  Lungs: Normal work of breathing. Neurologic: No focal deficits.   Lab Results  Component Value Date   CREATININE 0.72 03/01/2021   BUN 14 03/01/2021   NA 142 03/01/2021   K 4.5 03/01/2021   CL 104 03/01/2021   CO2 22 03/01/2021   Lab Results  Component Value Date   ALT 17 03/01/2021   AST 20 03/01/2021   ALKPHOS 60 03/01/2021   BILITOT 0.8 03/01/2021   Lab Results  Component Value Date   HGBA1C 5.5 03/01/2021   HGBA1C 5.6 01/01/2021   HGBA1C 5.5 10/24/2020  HGBA1C 5.6 05/22/2020   HGBA1C 5.5 08/09/2019   Lab Results  Component Value Date   INSULIN 8.2 03/01/2021   INSULIN 13.4 01/01/2021   INSULIN 15.2 10/24/2020   INSULIN 20.8 05/22/2020   INSULIN 16.2 02/14/2020   Lab Results  Component Value Date   TSH 1.540 01/01/2021   Lab Results  Component Value Date   CHOL 106 03/01/2021   HDL 37 (L) 03/01/2021   LDLCALC 48 03/01/2021   TRIG 118 03/01/2021   CHOLHDL  2.5 08/09/2019   Lab Results  Component Value Date   VD25OH 48.5 03/01/2021   VD25OH 48.8 01/01/2021   VD25OH 51.2 10/24/2020   Lab Results  Component Value Date   WBC 6.8 08/09/2019   HGB 15.1 08/09/2019   HCT 42.3 08/09/2019   MCV 96 08/09/2019   PLT 172 08/09/2019   No results found for: IRON, TIBC, FERRITIN  Attestation Statements:   Reviewed by clinician on day of visit: allergies, medications, problem list, medical history, surgical history, family history, social history, and previous encounter notes.   I, Trixie Dredge, am acting as transcriptionist for Dennard Nip, MD.  I have reviewed the above documentation for accuracy and completeness, and I agree with the above. -  Dennard Nip, MD

## 2021-07-02 ENCOUNTER — Ambulatory Visit (INDEPENDENT_AMBULATORY_CARE_PROVIDER_SITE_OTHER): Payer: Managed Care, Other (non HMO) | Admitting: Family Medicine

## 2021-07-02 ENCOUNTER — Encounter (INDEPENDENT_AMBULATORY_CARE_PROVIDER_SITE_OTHER): Payer: Self-pay | Admitting: Family Medicine

## 2021-07-02 VITALS — BP 156/83 | HR 66 | Temp 98.5°F | Ht 66.0 in | Wt 226.0 lb

## 2021-07-02 DIAGNOSIS — E669 Obesity, unspecified: Secondary | ICD-10-CM | POA: Diagnosis not present

## 2021-07-02 DIAGNOSIS — R7303 Prediabetes: Secondary | ICD-10-CM | POA: Diagnosis not present

## 2021-07-02 DIAGNOSIS — Z6836 Body mass index (BMI) 36.0-36.9, adult: Secondary | ICD-10-CM

## 2021-07-02 DIAGNOSIS — I10 Essential (primary) hypertension: Secondary | ICD-10-CM

## 2021-07-02 MED ORDER — SAXENDA 18 MG/3ML ~~LOC~~ SOPN: 3.0000 mg | PEN_INJECTOR | Freq: Every day | SUBCUTANEOUS | 0 refills | Status: DC

## 2021-07-02 MED ORDER — BD PEN NEEDLE NANO U/F 32G X 4 MM MISC: 1.0000 [IU] | 0 refills | Status: AC

## 2021-07-03 ENCOUNTER — Encounter (INDEPENDENT_AMBULATORY_CARE_PROVIDER_SITE_OTHER): Payer: Self-pay | Admitting: Family Medicine

## 2021-07-03 NOTE — Progress Notes (Signed)
Chief Complaint:   OBESITY Ruth Elliott is here to discuss her progress with her obesity treatment plan along with follow-up of her obesity related diagnoses. Ruth Elliott is on keeping a food journal and adhering to recommended goals of 1500 calories and 100+ grams of protein daily and states she is following her eating plan approximately 60% of the time. Ruth Elliott states she is walking for 25-30 minutes 3 times per week.  Today's visit was #: 68 Starting weight: 223 lbs Starting date: 08/30/2019 Today's weight: 226 lbs Today's date: 07/02/2021 Total lbs lost to date: 0 Total lbs lost since last in-office visit: 0  Interim History: Ruth Elliott continues to struggle with weight loss and she is open to looking at medicine options.  Her hunger is not an issue but her elevated insulin could be contributing to her weight gain.  Subjective:   1. White coat syndrome with hypertension [name]'s blood pressure at home is within normal limits, but her elevated blood pressures are mostly in the doctor's office.  She is stable on her medications.  2. Pre-diabetes Ruth Elliott is on metformin, but she is still struggling with weight loss.  She is open to starting a GLP-1 to help.  Assessment/Plan:   1. White coat syndrome with hypertension Ruth Elliott will continue with her diet and exercise, and we will continue to check her blood pressures at home.  2. Pre-diabetes Ruth Elliott will start her GLP-1, and we will continue to monitor closely.  3. Obesity, Current BMI 36.6 Ruth Elliott is currently in the action stage of change. As such, her goal is to continue with weight loss efforts. She has agreed to the Category 3 Plan or keeping a food journal and adhering to recommended goals of 1500 calories and 100+ grams of protein daily.   We discussed various medication options to help Ruth Elliott with her weight loss efforts and we both agreed to start Saxenda 3.0 mg daily (start at 0.6 mg until next visit), and nano needles #100 with no refills.  -  Liraglutide -Weight Management (SAXENDA) 18 MG/3ML SOPN; Inject 3 mg into the skin daily.  Dispense: 15 mL; Refill: 0 - Insulin Pen Needle (BD PEN NEEDLE NANO U/F) 32G X 4 MM MISC; 1 Units by Does not apply route 1 day or 1 dose for 1 dose.  Dispense: 100 each; Refill: 0  Exercise goals: As is.   Behavioral modification strategies: increasing lean protein intake.  Ruth Elliott has agreed to follow-up with our clinic in 4 weeks. She was informed of the importance of frequent follow-up visits to maximize her success with intensive lifestyle modifications for her multiple health conditions.   Objective:   Blood pressure (!) 156/83, pulse 66, temperature 98.5 F (36.9 C), height '5\' 6"'$  (1.676 m), weight 226 lb (102.5 kg), SpO2 97 %. Body mass index is 36.48 kg/m.  General: Cooperative, alert, well developed, in no acute distress. HEENT: Conjunctivae and lids unremarkable. Cardiovascular: Regular rhythm.  Lungs: Normal work of breathing. Neurologic: No focal deficits.   Lab Results  Component Value Date   CREATININE 0.72 03/01/2021   BUN 14 03/01/2021   NA 142 03/01/2021   K 4.5 03/01/2021   CL 104 03/01/2021   CO2 22 03/01/2021   Lab Results  Component Value Date   ALT 17 03/01/2021   AST 20 03/01/2021   ALKPHOS 60 03/01/2021   BILITOT 0.8 03/01/2021   Lab Results  Component Value Date   HGBA1C 5.5 03/01/2021   HGBA1C 5.6 01/01/2021   HGBA1C  5.5 10/24/2020   HGBA1C 5.6 05/22/2020   HGBA1C 5.5 08/09/2019   Lab Results  Component Value Date   INSULIN 8.2 03/01/2021   INSULIN 13.4 01/01/2021   INSULIN 15.2 10/24/2020   INSULIN 20.8 05/22/2020   INSULIN 16.2 02/14/2020   Lab Results  Component Value Date   TSH 1.540 01/01/2021   Lab Results  Component Value Date   CHOL 106 03/01/2021   HDL 37 (L) 03/01/2021   LDLCALC 48 03/01/2021   TRIG 118 03/01/2021   CHOLHDL 2.5 08/09/2019   Lab Results  Component Value Date   VD25OH 48.5 03/01/2021   VD25OH 48.8 01/01/2021    VD25OH 51.2 10/24/2020   Lab Results  Component Value Date   WBC 6.8 08/09/2019   HGB 15.1 08/09/2019   HCT 42.3 08/09/2019   MCV 96 08/09/2019   PLT 172 08/09/2019   No results found for: "IRON", "TIBC", "FERRITIN"  Attestation Statements:   Reviewed by clinician on day of visit: allergies, medications, problem list, medical history, surgical history, family history, social history, and previous encounter notes.   I, Trixie Dredge, am acting as transcriptionist for Dennard Nip, MD.  I have reviewed the above documentation for accuracy and completeness, and I agree with the above. -  Dennard Nip, MD

## 2021-07-03 NOTE — Progress Notes (Unsigned)
   I, Ruth Elliott, LAT, ATC, am serving as scribe for Dr. Lynne Leader.  Ruth Elliott is a 51 y.o. 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  She was last seen by Dr. Georgina Snell on 05/09/21 and was referred to [facility].  She was also advised to use Voltaren gel.  Today, pt reports   Diagnostic testing: R knee XR- 05/09/21  Pertinent review of systems: ***  Relevant historical information: ***   Exam:  There were no vitals taken for this visit. General: Well Developed, well nourished, and in no acute distress.   MSK: ***    Lab and Radiology Results No results found for this or any previous visit (from the past 72 hour(s)). No results found.     Assessment and Plan: 51 y.o. female with ***   PDMP not reviewed this encounter. No orders of the defined types were placed in this encounter.  No orders of the defined types were placed in this encounter.    Discussed warning signs or symptoms. Please see discharge instructions. Patient expresses understanding.   ***

## 2021-07-04 ENCOUNTER — Encounter (INDEPENDENT_AMBULATORY_CARE_PROVIDER_SITE_OTHER): Payer: Self-pay

## 2021-07-04 ENCOUNTER — Telehealth (INDEPENDENT_AMBULATORY_CARE_PROVIDER_SITE_OTHER): Payer: Self-pay | Admitting: Family Medicine

## 2021-07-04 NOTE — Telephone Encounter (Signed)
Dr. Leafy Ro - Prior authorization approved for Saxenda. Effective: 07/04/2021 - 01/03/2022. Patient sent approval message via mychart.

## 2021-07-04 NOTE — Telephone Encounter (Signed)
Prior authorization has already been started for Saxenda. Waiting on response from insurance. Will notify patient and provider once response is received.

## 2021-07-09 ENCOUNTER — Ambulatory Visit: Payer: Managed Care, Other (non HMO) | Admitting: Family Medicine

## 2021-07-09 ENCOUNTER — Encounter: Payer: Self-pay | Admitting: Family Medicine

## 2021-07-09 VITALS — BP 124/78 | HR 66 | Ht 66.0 in | Wt 227.4 lb

## 2021-07-09 DIAGNOSIS — G8929 Other chronic pain: Secondary | ICD-10-CM

## 2021-07-09 DIAGNOSIS — M25561 Pain in right knee: Secondary | ICD-10-CM | POA: Diagnosis not present

## 2021-07-14 ENCOUNTER — Other Ambulatory Visit (INDEPENDENT_AMBULATORY_CARE_PROVIDER_SITE_OTHER): Payer: Self-pay | Admitting: Bariatrics

## 2021-07-14 DIAGNOSIS — I10 Essential (primary) hypertension: Secondary | ICD-10-CM

## 2021-07-16 ENCOUNTER — Ambulatory Visit: Payer: Managed Care, Other (non HMO) | Admitting: Family

## 2021-07-16 ENCOUNTER — Encounter: Payer: Self-pay | Admitting: Family

## 2021-07-16 VITALS — BP 169/100 | HR 72 | Temp 98.7°F | Resp 16 | Ht 66.0 in | Wt 230.0 lb

## 2021-07-16 DIAGNOSIS — L84 Corns and callosities: Secondary | ICD-10-CM | POA: Insufficient documentation

## 2021-07-16 NOTE — Assessment & Plan Note (Signed)
Could try corn/callous pads Will refer to podiatry in case needs filed Wear support pad inserts/ortho inserts Wear supportive shoes

## 2021-07-16 NOTE — Patient Instructions (Addendum)
A referral was placed today for podiatry. Please let us know if you have not heard back within 2 weeks about the referral.  Get corn/callus pads for shoes and also pads from over the counter to soften the skin.   Due to recent changes in healthcare laws, you may see results of your imaging and/or laboratory studies on MyChart before I have had a chance to review them.  I understand that in some cases there may be results that are confusing or concerning to you. Please understand that not all results are received at the same time and often I may need to interpret multiple results in order to provide you with the best plan of care or course of treatment. Therefore, I ask that you please give me 2 business days to thoroughly review all your results before contacting my office for clarification. Should we see a critical lab result, you will be contacted sooner.   It was a pleasure seeing you today! Please do not hesitate to reach out with any questions and or concerns.  Regards,   Eugenia Pancoast FNP-C

## 2021-07-16 NOTE — Progress Notes (Signed)
Established Patient Office Visit  Subjective:  Patient ID: Ruth Elliott, female    DOB: [birth year]  Age: 51 y.o. MRN: [id number]  CC:  Chief Complaint  Patient presents with   Foot Pain    Right when you step on it it shoots a pain. X 4 days    HPI Ruth Elliott is here today with concerns.   Five days ago while walking around the house started feeling a shooting sharp pain on the right base of foot on the lateral side of the foot.   This am doesn't hurt anymore. Did change socks to all cotton so wondering if that played into it.  Also improvement with a new riding britches as far as no longer with redness bil lower feet   Past Medical History:  Diagnosis Date   Allergic rhinitis    Allergy    Back pain    Endometriosis    Followed by [facility]- on Nortrel   Joint pain    Obesity    Thyroid disease    White coat syndrome with hypertension     Past Surgical History:  Procedure Laterality Date   APPENDECTOMY     NASAL SINUS SURGERY     OVARIAN CYST SURGERY     THYROIDECTOMY Left 11/17/2018   Procedure: THYROIDECTOMY-Hemi Thyroidectomy;  Surgeon: Linus Salmons, MD;  Location: [facility] OR[facility];  Service: ENT;  Laterality: Left;   TONSILLECTOMY  1994    Family History  Problem Relation Age of Onset   COPD Mother    Hypertension Mother    Diabetes Mother    Heart disease Mother    Alzheimer's disease Father    Hypertension Father     Social History   Socioeconomic History   Marital status: Single    Spouse name: Not on file   Number of children: Not on file   Years of education: Not on file   Highest education level: Not on file  Occupational History   Occupation: Stage manager supvervisor  Tobacco Use   Smoking status: Never   Smokeless tobacco: Never  Vaping Use   Vaping Use: Never used  Substance and Sexual Activity   Alcohol use: Yes    Alcohol/week: 0.0 standard drinks of alcohol    Comment: 1-2 per month beer   Drug use: No    Sexual activity: Not Currently  Other Topics Concern   Not on file  Social History Narrative   Works at [employer]. Cytology supervisor.  Recently moved here from [place].   G0   Single , lives alone, 2 dogs and 2 horses    Social Determinants of Health   Financial Resource Strain: Not on file  Food Insecurity: Not on file  Transportation Needs: Not on file  Physical Activity: Not on file  Stress: Not on file  Social Connections: Not on file  Intimate Partner Violence: Not on file    Outpatient Medications Prior to Visit  Medication Sig Dispense Refill   amLODipine (NORVASC) 5 MG tablet TAKE 1 TABLET(5 MG) BY MOUTH DAILY 90 tablet 0   B Complex-C-Folic Acid (HM SUPER VITAMIN B COMPLEX/C PO) Take by mouth.     cetirizine (ZYRTEC) 10 MG chewable tablet Chew 10 mg by mouth daily.     cholecalciferol (VITAMIN D3) 25 MCG (1000 UNIT) tablet Take 1,000 Units by mouth daily.     COLLAGEN PO Take 1 Scoop by mouth daily. Collagen Peptide in coffee in am     Flax Oil-Fish  Oil-Borage Oil (FISH OIL-FLAX OIL-BORAGE OIL) CAPS Take 2 capsules by mouth daily.      fluticasone (FLONASE) 50 MCG/ACT nasal spray SHAKE LIQUID AND USE 2 SPRAYS IN EACH NOSTRIL DAILY 16 g 1   ibuprofen (ADVIL) 400 MG tablet Take 400 mg by mouth every 6 (six) hours as needed.     metFORMIN (GLUCOPHAGE) 500 MG tablet Take 1 tablet (500 mg total) by mouth daily with lunch. 90 tablet 0   Multiple Vitamin (MULTIVITAMIN) capsule Take 1 capsule by mouth daily.     norethindrone-ethinyl estradiol (MICROGESTIN,JUNEL,LOESTRIN) 1-20 MG-MCG tablet Take 1 tablet by mouth daily.  0   valsartan (DIOVAN) 160 MG tablet Take 1/3 tablet po qd 90 tablet 3   No facility-administered medications prior to visit.    Allergies  Allergen Reactions   Penicillins Shortness Of Breath    Did it involve swelling of the face/tongue/throat, SOB, or low BP? Yes Did it involve sudden or severe rash/hives, skin peeling, or any reaction on the inside of  your mouth or nose? No Did you need to seek medical attention at a hospital or doctor's office? Yes When did it last happen?   childhood    If all above answers are "NO", may proceed with cephalosporin use.    Hydralazine     Elevated blood pressure, migraine, and lethary   Lisinopril     paresthesia   Singulair [Montelukast Sodium]     Moody        Objective:    Physical Exam Constitutional:      General: She is not in acute distress.    Appearance: Normal appearance. She is obese. She is not ill-appearing, toxic-appearing or diaphoretic.  Pulmonary:     Effort: Pulmonary effort is normal.  Feet:     Right foot:     Skin integrity: Callus (right mid pad of foot with tenderness and small painful callus. no erythema no discharge) present.  Neurological:     Mental Status: She is alert.        BP (!) 169/100   Pulse 72   Temp 98.7 F (37.1 C)   Resp 16   Ht $R'5\' 6"'Rx$  (1.676 m)   Wt 230 lb (104.3 kg)   SpO2 97%   BMI 37.12 kg/m  Wt Readings from Last 3 Encounters:  07/16/21 230 lb (104.3 kg)  07/09/21 227 lb 6.4 oz (103.1 kg)  07/02/21 226 lb (102.5 kg)     Health Maintenance Due  Topic Date Due   Zoster Vaccines- Shingrix (1 of 2) Never done   COLONOSCOPY (Pts 45-49yrs Insurance coverage will need to be confirmed)  Never done   COVID-19 Vaccine (4 - Booster for Bigelow series) 12/19/2019   PAP SMEAR-Modifier  03/23/2020   MAMMOGRAM  06/14/2020    There are no preventive care reminders to display for this patient.  Lab Results  Component Value Date   TSH 1.540 01/01/2021   Lab Results  Component Value Date   WBC 6.8 08/09/2019   HGB 15.1 08/09/2019   HCT 42.3 08/09/2019   MCV 96 08/09/2019   PLT 172 08/09/2019   Lab Results  Component Value Date   NA 142 03/01/2021   K 4.5 03/01/2021   CO2 22 03/01/2021   GLUCOSE 79 03/01/2021   BUN 14 03/01/2021   CREATININE 0.72 03/01/2021   BILITOT 0.8 03/01/2021   ALKPHOS 60 03/01/2021   AST 20  03/01/2021   ALT 17 03/01/2021   PROT 6.9 03/01/2021  ALBUMIN 4.5 03/01/2021   CALCIUM 9.0 03/01/2021   EGFR 101 03/01/2021   Lab Results  Component Value Date   HGBA1C 5.5 03/01/2021      Assessment & Plan:   Problem List Items Addressed This Visit       Musculoskeletal and Integument   Corn of foot - Primary    Could try corn/callous pads Will refer to podiatry in case needs filed Wear support pad inserts/ortho inserts Wear supportive shoes      Relevant Orders   Ambulatory referral to Podiatry    No orders of the defined types were placed in this encounter.   Follow-up: No follow-ups on file.    Eugenia Pancoast, FNP

## 2021-07-23 ENCOUNTER — Telehealth: Payer: Self-pay | Admitting: Cardiovascular Disease

## 2021-07-23 ENCOUNTER — Other Ambulatory Visit (INDEPENDENT_AMBULATORY_CARE_PROVIDER_SITE_OTHER): Payer: Self-pay | Admitting: Cardiovascular Disease

## 2021-07-23 DIAGNOSIS — I10 Essential (primary) hypertension: Secondary | ICD-10-CM

## 2021-07-23 LAB — HM MAMMOGRAPHY

## 2021-07-23 MED ORDER — AMLODIPINE BESYLATE 5 MG PO TABS: ORAL_TABLET | ORAL | 1 refills | Status: DC

## 2021-07-23 NOTE — Telephone Encounter (Signed)
 *  STAT* If patient is at the pharmacy, call can be transferred to refill team.   1. Which medications need to be refilled? (please list name of each medication and dose if known) amLODipine (NORVASC) 5 MG tablet  2. Which pharmacy/location (including street and city if local pharmacy) is medication to be sent to? [facility] [place], [place]  3. Do they need a 30 day or 90 day supply? 90 days  Pt is out of meds. She said, this prescription initially sent by Dr. Rockey Situ

## 2021-07-23 NOTE — Telephone Encounter (Signed)
Amlodipine refill sent to the pharmacy.

## 2021-07-30 ENCOUNTER — Ambulatory Visit (INDEPENDENT_AMBULATORY_CARE_PROVIDER_SITE_OTHER): Payer: Managed Care, Other (non HMO) | Admitting: Family Medicine

## 2021-07-30 ENCOUNTER — Other Ambulatory Visit: Payer: Self-pay

## 2021-07-30 ENCOUNTER — Other Ambulatory Visit (HOSPITAL_COMMUNITY): Payer: Self-pay

## 2021-07-30 ENCOUNTER — Encounter (INDEPENDENT_AMBULATORY_CARE_PROVIDER_SITE_OTHER): Payer: Self-pay | Admitting: Family Medicine

## 2021-07-30 VITALS — BP 128/79 | HR 70 | Temp 98.7°F | Ht 66.0 in | Wt 223.0 lb

## 2021-07-30 DIAGNOSIS — E669 Obesity, unspecified: Secondary | ICD-10-CM

## 2021-07-30 DIAGNOSIS — R7303 Prediabetes: Secondary | ICD-10-CM | POA: Diagnosis not present

## 2021-07-30 DIAGNOSIS — I10 Essential (primary) hypertension: Secondary | ICD-10-CM | POA: Diagnosis not present

## 2021-07-30 DIAGNOSIS — E66812 Obesity, class 2: Secondary | ICD-10-CM

## 2021-07-30 DIAGNOSIS — Z6836 Body mass index (BMI) 36.0-36.9, adult: Secondary | ICD-10-CM

## 2021-07-30 MED ORDER — BD PEN NEEDLE NANO U/F 32G X 4 MM MISC: 1.0000 [IU] | 0 refills | Status: AC

## 2021-07-30 MED ORDER — SAXENDA 18 MG/3ML ~~LOC~~ SOPN: 3.0000 mg | PEN_INJECTOR | Freq: Every day | SUBCUTANEOUS | 0 refills | Status: DC

## 2021-07-31 ENCOUNTER — Encounter (INDEPENDENT_AMBULATORY_CARE_PROVIDER_SITE_OTHER): Payer: Self-pay | Admitting: Family Medicine

## 2021-07-31 NOTE — Telephone Encounter (Signed)
Prior authorization for Ruth Elliott was approved on 07/04/2021 - 01/03/2022 and patient sent message via mychart. Please see information in chart. No additional prior authorization is needed.

## 2021-07-31 NOTE — Telephone Encounter (Signed)
Please check PA Please, Thanks

## 2021-08-01 NOTE — Progress Notes (Signed)
Chief Complaint:   OBESITY Ruth Elliott is here to discuss her progress with her obesity treatment plan along with follow-up of her obesity related diagnoses. Ruth Elliott is on the Category 3 Plan and keeping a food journal and adhering to recommended goals of 1500 calories and 100+ grams of protein daily and states she is following her eating plan approximately 80% of the time. Ruth Elliott states she is walking and using nordic track for 20-30 minutes 5 times per week.  Today's visit was #: 17 Starting weight: 223 lbs Starting date: 08/30/2019 Today's weight: 223 lbs Today's date: 07/30/2021 Total lbs lost to date: 0 Total lbs lost since last in-office visit: 3  Interim History: Ruth Elliott continues to work on her diet and weight loss.  She got a prior authorization for Saxenda but her pharmacy changed her to a full price.  Subjective:   1. Essential hypertension Ruth Elliott's blood pressure is well controlled on her medications, and she denies headache or chest pain.  2. Pre-diabetes Ruth Elliott is stable on metformin, and she is trying to get on a GLP-1, but she is running into insurance issues.  Assessment/Plan:   1. Essential hypertension Ruth Elliott will continue her medications and diet to improve blood pressure control. We will continue to follow.  2. Pre-diabetes Ruth Elliott will continue metformin and her diet to help decrease the risk of diabetes.  She will work on [employer] for weight loss.  3. Obesity, Current BMI 36.1 Ruth Elliott is currently in the action stage of change. As such, her goal is to continue with weight loss efforts. She has agreed to keeping a food journal and adhering to recommended goals of 1500 calories and 100+ grams of protein daily.   We discussed various medication options to help Ruth Elliott with her weight loss efforts and we both agreed to send Saxenda 3 mg prescription and pen needles to [facility].  - Liraglutide -Weight Management (SAXENDA) 18 MG/3ML SOPN; Inject 3 mg into the skin  daily.  Dispense: 15 mL; Refill: 0 - Insulin Pen Needle (BD PEN NEEDLE NANO U/F) 32G X 4 MM MISC; 1 Units by Does not apply route 1 day or 1 dose for 1 dose.  Dispense: 100 each; Refill: 0  Exercise goals: As is.  Behavioral modification strategies: increasing lean protein intake.  Ruth Elliott has agreed to follow-up with our clinic in 2 to 3 weeks weeks. She was informed of the importance of frequent follow-up visits to maximize her success with intensive lifestyle modifications for her multiple health conditions.   Objective:   Blood pressure 128/79, pulse 70, temperature 98.7 F (37.1 C), height '5\' 6"'$  (1.676 m), weight 223 lb (101.2 kg), SpO2 98 %. Body mass index is 35.99 kg/m.  General: Cooperative, alert, well developed, in no acute distress. HEENT: Conjunctivae and lids unremarkable. Cardiovascular: Regular rhythm.  Lungs: Normal work of breathing. Neurologic: No focal deficits.   Lab Results  Component Value Date   CREATININE 0.72 03/01/2021   BUN 14 03/01/2021   NA 142 03/01/2021   K 4.5 03/01/2021   CL 104 03/01/2021   CO2 22 03/01/2021   Lab Results  Component Value Date   ALT 17 03/01/2021   AST 20 03/01/2021   ALKPHOS 60 03/01/2021   BILITOT 0.8 03/01/2021   Lab Results  Component Value Date   HGBA1C 5.5 03/01/2021   HGBA1C 5.6 01/01/2021   HGBA1C 5.5 10/24/2020   HGBA1C 5.6 05/22/2020   HGBA1C 5.5 08/09/2019   Lab Results  Component  Value Date   INSULIN 8.2 03/01/2021   INSULIN 13.4 01/01/2021   INSULIN 15.2 10/24/2020   INSULIN 20.8 05/22/2020   INSULIN 16.2 02/14/2020   Lab Results  Component Value Date   TSH 1.540 01/01/2021   Lab Results  Component Value Date   CHOL 106 03/01/2021   HDL 37 (L) 03/01/2021   LDLCALC 48 03/01/2021   TRIG 118 03/01/2021   CHOLHDL 2.5 08/09/2019   Lab Results  Component Value Date   VD25OH 48.5 03/01/2021   VD25OH 48.8 01/01/2021   VD25OH 51.2 10/24/2020   Lab Results  Component Value Date   WBC 6.8  08/09/2019   HGB 15.1 08/09/2019   HCT 42.3 08/09/2019   MCV 96 08/09/2019   PLT 172 08/09/2019   No results found for: "IRON", "TIBC", "FERRITIN"  Attestation Statements:   Reviewed by clinician on day of visit: allergies, medications, problem list, medical history, surgical history, family history, social history, and previous encounter notes.  Time spent on visit including pre-visit chart review and post-visit care and charting was 40 minutes.   I, Trixie Dredge, am acting as transcriptionist for Dennard Nip, MD.  I have reviewed the above documentation for accuracy and completeness, and I agree with the above. -  Dennard Nip, MD

## 2021-08-06 NOTE — Telephone Encounter (Signed)
I contacted patient pharmacy benefits and spent 52 minutes on call and spoke to 3 different people in reference to price reduction. This is patient assistance.  Price reduction is patient assistance program for copay tier reduction. Confirmed patient copay for medication is $1461 with prior authorization. When prior authorization was sent, last office note with labs were included.  Patient assistance/Tier Cost Reduction form is being faxed for provider to complete. Per telephone conversation, provider must give in detail why patient needs to take this medication and provider must give in detail why patient can not take any other medication for tier reduction. Form completion does not guarantee they will lower cost, they will review and notify you of the decision. Confirmed that prior authorization on file and approved from 07/04/2021 - 01/03/2022. Call reference # [id number]. Form in fax [employer] for completion. Thanks!

## 2021-08-06 NOTE — Telephone Encounter (Signed)
The patient said her insurance company asked [name] to contact them and ask to lower her copay tier. I have never heard of this. Ruth Elliott, can you contact her insurance company asd inquire about this?

## 2021-08-06 NOTE — Telephone Encounter (Signed)
Please review

## 2021-08-07 NOTE — Telephone Encounter (Signed)
Ok Thanks, Arbie Cookey, I will fill out the form when you get it.

## 2021-08-09 NOTE — Telephone Encounter (Signed)
Form received and faxed yesterday on 08/08/2021

## 2021-08-18 LAB — COLOGUARD
COLOGUARD: NEGATIVE
Cologuard: NEGATIVE

## 2021-08-18 LAB — EXTERNAL GENERIC LAB PROCEDURE: COLOGUARD: NEGATIVE

## 2021-08-22 ENCOUNTER — Encounter (INDEPENDENT_AMBULATORY_CARE_PROVIDER_SITE_OTHER): Payer: Self-pay

## 2021-08-23 ENCOUNTER — Ambulatory Visit (INDEPENDENT_AMBULATORY_CARE_PROVIDER_SITE_OTHER): Payer: Managed Care, Other (non HMO) | Admitting: Family Medicine

## 2021-08-23 ENCOUNTER — Encounter (INDEPENDENT_AMBULATORY_CARE_PROVIDER_SITE_OTHER): Payer: Self-pay | Admitting: Family Medicine

## 2021-08-23 VITALS — BP 144/81 | HR 66 | Temp 98.0°F | Ht 66.0 in | Wt 222.0 lb

## 2021-08-23 DIAGNOSIS — Z6835 Body mass index (BMI) 35.0-35.9, adult: Secondary | ICD-10-CM

## 2021-08-23 DIAGNOSIS — R7303 Prediabetes: Secondary | ICD-10-CM | POA: Diagnosis not present

## 2021-08-23 DIAGNOSIS — E890 Postprocedural hypothyroidism: Secondary | ICD-10-CM | POA: Diagnosis not present

## 2021-08-23 DIAGNOSIS — E559 Vitamin D deficiency, unspecified: Secondary | ICD-10-CM | POA: Diagnosis not present

## 2021-08-23 DIAGNOSIS — E7849 Other hyperlipidemia: Secondary | ICD-10-CM | POA: Diagnosis not present

## 2021-08-23 DIAGNOSIS — E785 Hyperlipidemia, unspecified: Secondary | ICD-10-CM

## 2021-08-23 DIAGNOSIS — E669 Obesity, unspecified: Secondary | ICD-10-CM

## 2021-08-24 LAB — LIPID PANEL WITH LDL/HDL RATIO
Cholesterol, Total: 101 mg/dL (ref 100–199)
HDL: 40 mg/dL (ref >39)
LDL Chol Calc (NIH): 42 mg/dL (ref 0–99)
LDL/HDL Ratio: 1.1 ratio (ref 0.0–3.2)
Triglycerides: 100 mg/dL (ref 0–149)
VLDL Cholesterol Cal: 19 mg/dL (ref 5–40)

## 2021-08-24 LAB — CBC WITH DIFFERENTIAL/PLATELET
Basophils Absolute: 0 10*3/uL (ref 0.0–0.2)
Basos: 0 %
EOS (ABSOLUTE): 0.1 10*3/uL (ref 0.0–0.4)
Eos: 1 %
Hematocrit: 39.3 % (ref 34.0–46.6)
Hemoglobin: 13.2 g/dL (ref 11.1–15.9)
Immature Grans (Abs): 0 10*3/uL (ref 0.0–0.1)
Immature Granulocytes: 0 %
Lymphocytes Absolute: 3 10*3/uL (ref 0.7–3.1)
Lymphs: 33 %
MCH: 32.9 pg (ref 26.6–33.0)
MCHC: 33.6 g/dL (ref 31.5–35.7)
MCV: 98 fL — ABNORMAL HIGH (ref 79–97)
Monocytes Absolute: 0.5 10*3/uL (ref 0.1–0.9)
Monocytes: 6 %
Neutrophils Absolute: 5.4 10*3/uL (ref 1.4–7.0)
Neutrophils: 60 %
Platelets: 180 10*3/uL (ref 150–450)
RBC: 4.01 x10E6/uL (ref 3.77–5.28)
RDW: 11.8 % (ref 11.7–15.4)
WBC: 9.1 10*3/uL (ref 3.4–10.8)

## 2021-08-24 LAB — HEMOGLOBIN A1C
Est. average glucose Bld gHb Est-mCnc: 108 mg/dL
Hgb A1c MFr Bld: 5.4 % (ref 4.8–5.6)

## 2021-08-24 LAB — VITAMIN D 25 HYDROXY (VIT D DEFICIENCY, FRACTURES): Vit D, 25-Hydroxy: 51.7 ng/mL (ref 30.0–100.0)

## 2021-08-24 LAB — CMP14+EGFR
ALT: 13 IU/L (ref 0–32)
AST: 18 IU/L (ref 0–40)
Albumin/Globulin Ratio: 2 (ref 1.2–2.2)
Albumin: 4.5 g/dL (ref 3.8–4.9)
Alkaline Phosphatase: 53 IU/L (ref 44–121)
BUN/Creatinine Ratio: 19 (ref 9–23)
BUN: 14 mg/dL (ref 6–24)
Bilirubin Total: 0.7 mg/dL (ref 0.0–1.2)
CO2: 20 mmol/L (ref 20–29)
Calcium: 9.2 mg/dL (ref 8.7–10.2)
Chloride: 103 mmol/L (ref 96–106)
Creatinine, Ser: 0.72 mg/dL (ref 0.57–1.00)
Globulin, Total: 2.3 g/dL (ref 1.5–4.5)
Glucose: 79 mg/dL (ref 70–99)
Potassium: 4.2 mmol/L (ref 3.5–5.2)
Sodium: 140 mmol/L (ref 134–144)
Total Protein: 6.8 g/dL (ref 6.0–8.5)
eGFR: 101 mL/min/{1.73_m2} (ref >59)

## 2021-08-24 LAB — TSH: TSH: 1.62 u[IU]/mL (ref 0.450–4.500)

## 2021-08-24 LAB — T4, FREE: Free T4: 1.18 ng/dL (ref 0.82–1.77)

## 2021-08-24 LAB — T3: T3, Total: 186 ng/dL — ABNORMAL HIGH (ref 71–180)

## 2021-08-24 LAB — INSULIN, RANDOM: INSULIN: 12.9 u[IU]/mL (ref 2.6–24.9)

## 2021-09-03 NOTE — Progress Notes (Unsigned)
Chief Complaint:   OBESITY Ruth Elliott is here to discuss her progress with her obesity treatment plan along with follow-up of her obesity related diagnoses. Ruth Elliott is on keeping a food journal and adhering to recommended goals of 1500 calories and 100+ grams of protein daily and states she is following her eating plan approximately 65% of the time. Ruth Elliott states she is walking and cycling for 30 minutes 5 times per week.  Today's visit was #: 17 Starting weight: 223 lbs Starting date: 08/30/2019 Today's weight: 222 lbs Today's date: 08/23/2021 Total lbs lost to date: 1 Total lbs lost since last in-office visit: 1  Interim History: Ruth Elliott continues to work on her weight loss, diet, and exercise.  She has been especially busy at work and meal planning has been especially difficult.  Her hunger is controlled.  Subjective:   1. Pre-diabetes Ruth Elliott is on metformin but she is unable to be on a GLP-one for now.  2. Vitamin D deficiency Ruth Elliott's last vitamin D level was not quite at goal.  She notes fatigue.  3. Other hyperlipidemia Ruth Elliott has a history of hyperlipidemia.  She is working on her diet and she is not on statin.  4. Postoperative hypothyroidism Ruth Elliott is status post partial thyroidectomy due to benign cyst with impingement.  She is not on Synthroid, and she is due for labs.  She still notes fatigue even with better sleep.  Assessment/Plan:   1. Pre-diabetes We will check labs today. Ruth Elliott agreed to increase metformin to 500 mg twice daily #180, and we will refill for 90 days.   - CBC with Differential/Platelet - CMP14+EGFR - Hemoglobin A1c - Insulin, random  2. Vitamin D deficiency We will check labs today. Ruth Elliott will follow-up for routine testing of Vitamin D, at least 2-3 times per year to avoid over-replacement.  - VITAMIN D 25 Hydroxy (Vit-D Deficiency, Fractures)  3. Other hyperlipidemia We will check labs today. Ruth Elliott will continue to work on diet, exercise and  weight loss efforts. Orders and follow up as documented in patient record.   - Lipid Panel With LDL/HDL Ratio  4. Postoperative hypothyroidism We will check labs today. Orders and follow up as documented in patient record.  - TSH - T4, free - T3  5. Obesity, Current BMI 35.9 Ruth Elliott is currently in the action stage of change. As such, her goal is to continue with weight loss efforts. She has agreed to keeping a food journal and adhering to recommended goals of 1500 calories and 100+ grams of protein daily.   Exercise goals: As is.   Behavioral modification strategies: increasing lean protein intake, increasing vegetables, and no skipping meals.  Ruth Elliott has agreed to follow-up with our clinic in 4 weeks. She was informed of the importance of frequent follow-up visits to maximize her success with intensive lifestyle modifications for her multiple health conditions.   Ruth Elliott was informed we would discuss her lab results at her next visit unless there is a critical issue that needs to be addressed sooner. Ruth Elliott agreed to keep her next visit at the agreed upon time to discuss these results.  Objective:   Blood pressure (!) 144/81, pulse 66, temperature 98 F (36.7 C), height 5' 6" (1.676 m), weight 222 lb (100.7 kg), SpO2 99 %. Body mass index is 35.83 kg/m.  General: Cooperative, alert, well developed, in no acute distress. HEENT: Conjunctivae and lids unremarkable. Cardiovascular: Regular rhythm.  Lungs: Normal work of breathing. Neurologic: No focal deficits.  Lab Results  Component Value Date   CREATININE 0.72 08/23/2021   BUN 14 08/23/2021   NA 140 08/23/2021   K 4.2 08/23/2021   CL 103 08/23/2021   CO2 20 08/23/2021   Lab Results  Component Value Date   ALT 13 08/23/2021   AST 18 08/23/2021   ALKPHOS 53 08/23/2021   BILITOT 0.7 08/23/2021   Lab Results  Component Value Date   HGBA1C 5.4 08/23/2021   HGBA1C 5.5 03/01/2021   HGBA1C 5.6 01/01/2021   HGBA1C 5.5  10/24/2020   HGBA1C 5.6 05/22/2020   Lab Results  Component Value Date   INSULIN 12.9 08/23/2021   INSULIN 8.2 03/01/2021   INSULIN 13.4 01/01/2021   INSULIN 15.2 10/24/2020   INSULIN 20.8 05/22/2020   Lab Results  Component Value Date   TSH 1.620 08/23/2021   Lab Results  Component Value Date   CHOL 101 08/23/2021   HDL 40 08/23/2021   LDLCALC 42 08/23/2021   TRIG 100 08/23/2021   CHOLHDL 2.5 08/09/2019   Lab Results  Component Value Date   VD25OH 51.7 08/23/2021   VD25OH 48.5 03/01/2021   VD25OH 48.8 01/01/2021   Lab Results  Component Value Date   WBC 9.1 08/23/2021   HGB 13.2 08/23/2021   HCT 39.3 08/23/2021   MCV 98 (H) 08/23/2021   PLT 180 08/23/2021   No results found for: "IRON", "TIBC", "FERRITIN"  Attestation Statements:   Reviewed by clinician on day of visit: allergies, medications, problem list, medical history, surgical history, family history, social history, and previous encounter notes.  Time spent on visit including pre-visit chart review and post-visit care and charting was 40 minutes.   I, Sharon Martin, am acting as transcriptionist for Caren Beasley, MD.  I have reviewed the above documentation for accuracy and completeness, and I agree with the above. -  Caren Beasley, MD   

## 2021-09-04 MED ORDER — METFORMIN HCL 500 MG PO TABS: 500.0000 mg | ORAL_TABLET | Freq: Two times a day (BID) | ORAL | 0 refills | Status: DC

## 2021-09-19 IMAGING — CT CT NECK W/ CM
3 of 5 series · 11 of 35 positions shown, 13 images · IV contrast (omnipaque)
Comparison: 10/23/2018

CLINICAL DATA: Right neck mass.  History of left hemithyroidectomy.

EXAM:
CT NECK WITH CONTRAST
TECHNIQUE: Multidetector CT imaging of the neck was performed using the
standard protocol following the bolus administration of intravenous
contrast.
CONTRAST:  75mL OMNIPAQUE IOHEXOL 300 MG/ML  SOLN

[Series 5: coronals neck 2.00 cor · coronal · 0.36mm/px · 3 of 128 slices shown]
[im 40/128  bone]
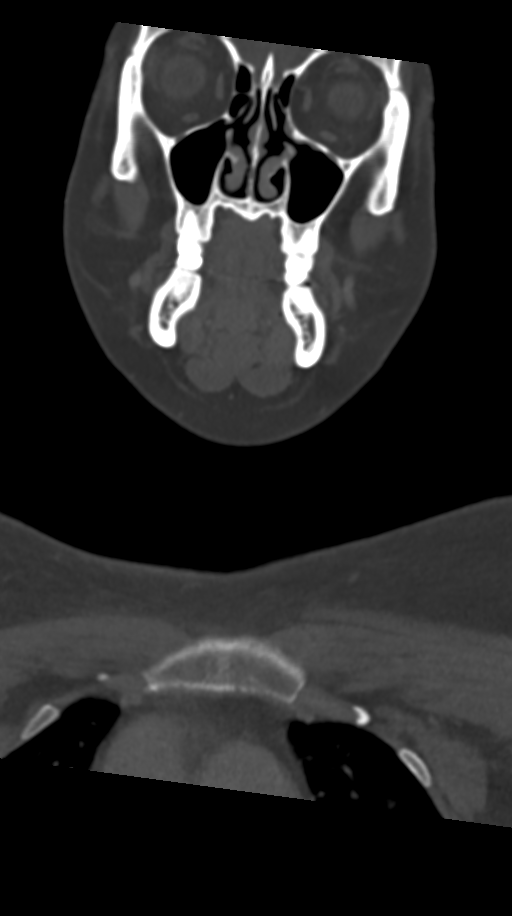
[im 56/128  bone]
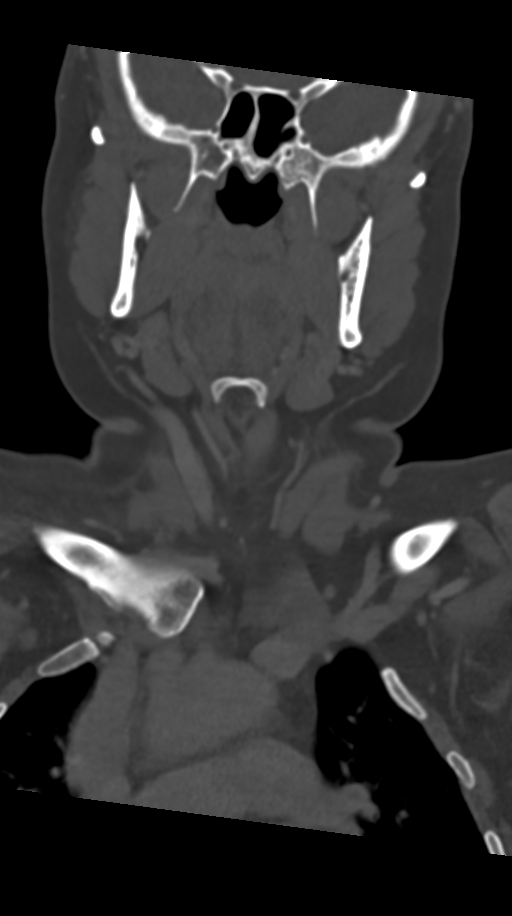
[im 72/128  bone]
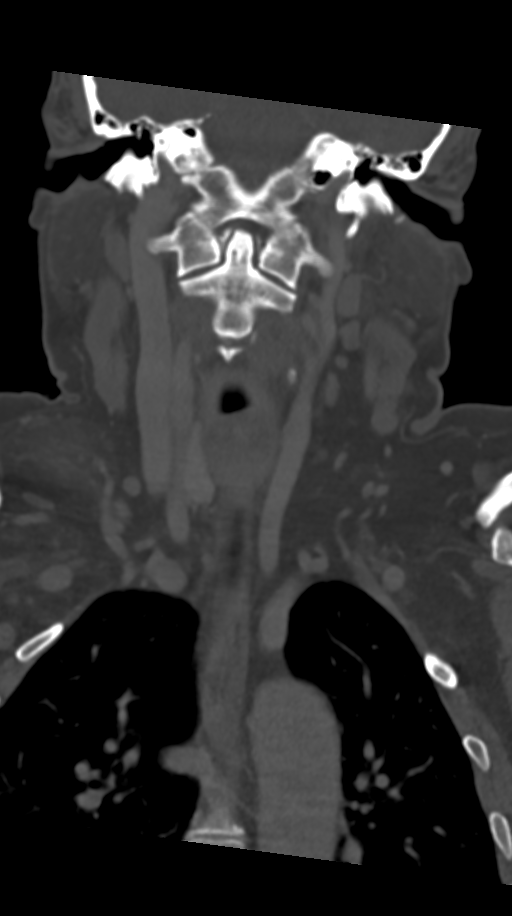

[Series 7: sagittals neck 2.00 sag · sagittal · 0.50mm/px · 5 of 93 slices shown, 6 images]
[im 31/93  bone]
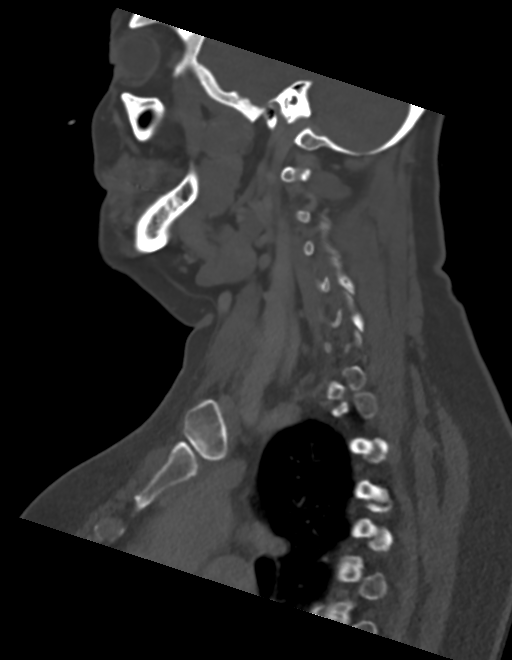
[im 39/93  bone]
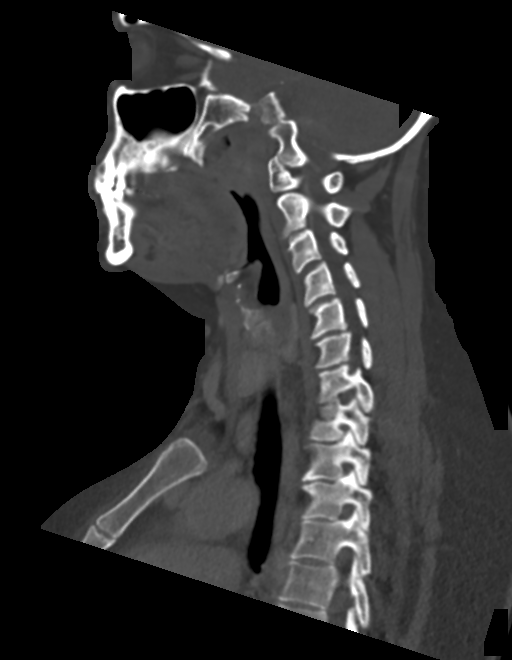
[im 47/93  soft-tissue]
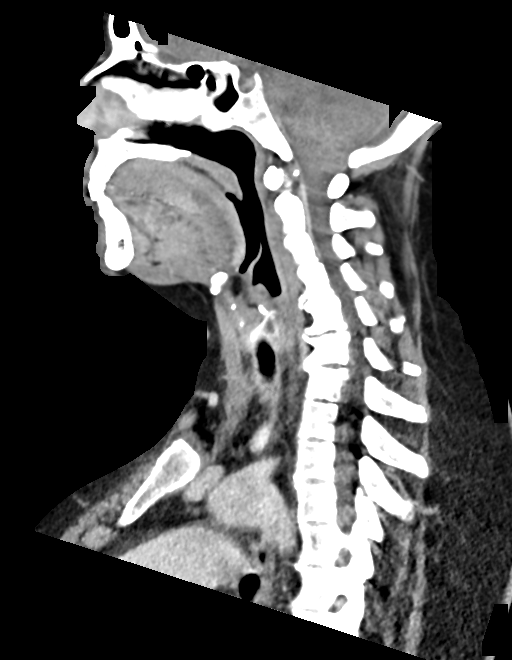
[im 47/93  bone]
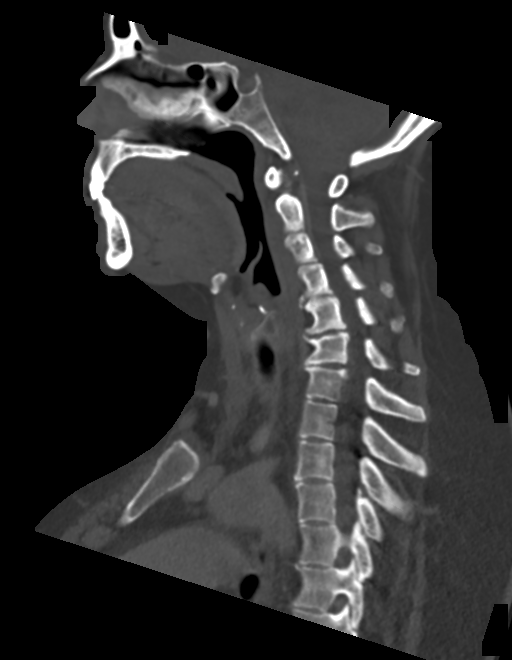
[im 54/93  bone]
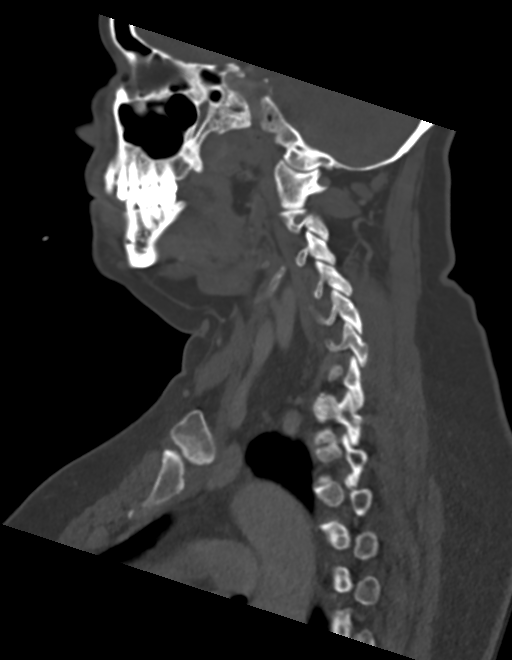
[im 62/93  bone]
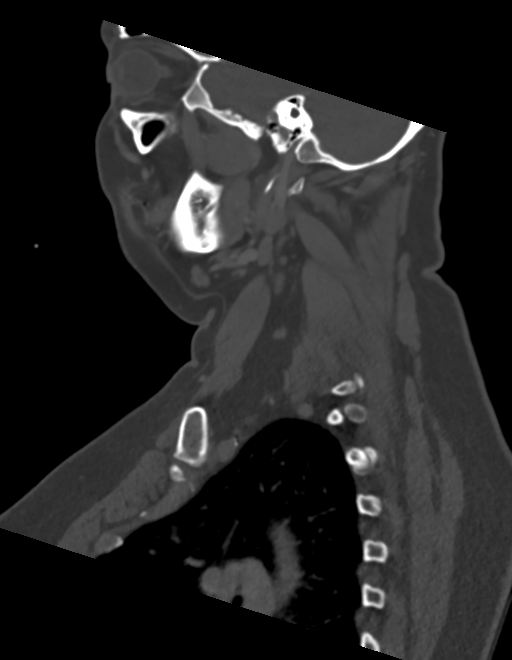

[Series 9: ax oropharynx neck 2.00 ax · axial · 0.36mm/px · z∈[-794,-608]mm · 3 of 166 slices shown, 4 images]
[im 34/166  soft-tissue]
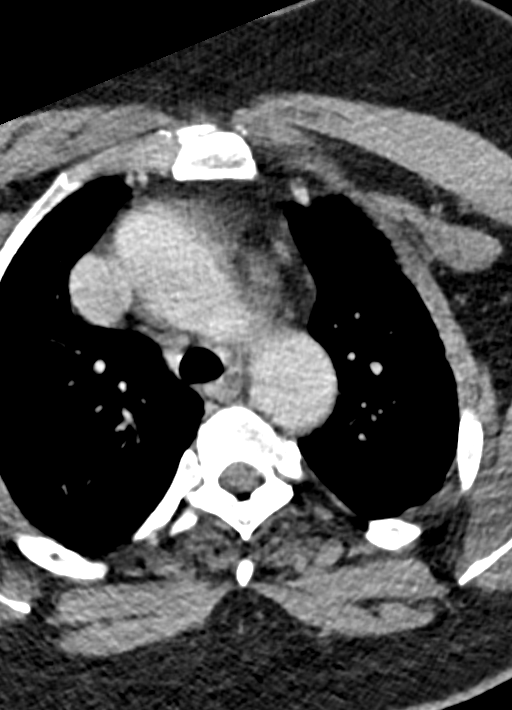
[im 34/166  bone]
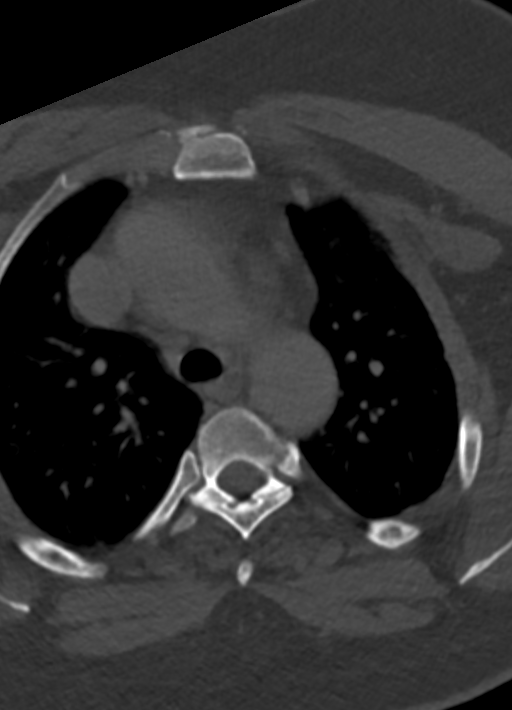
[im 100/166  bone]
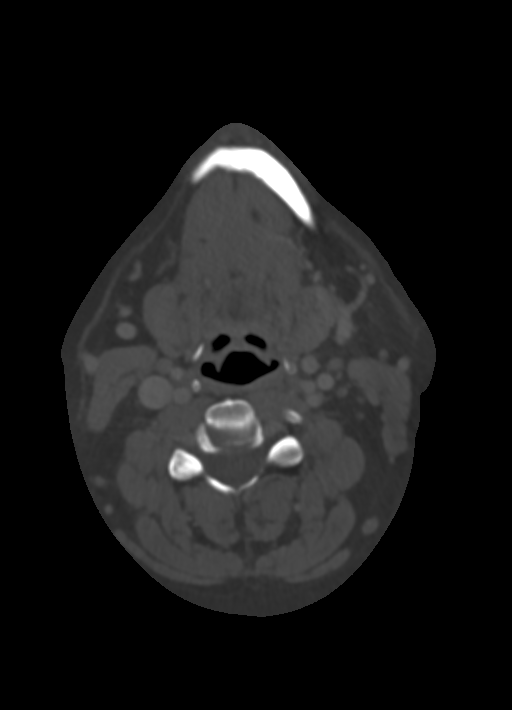
[im 133/166  bone]
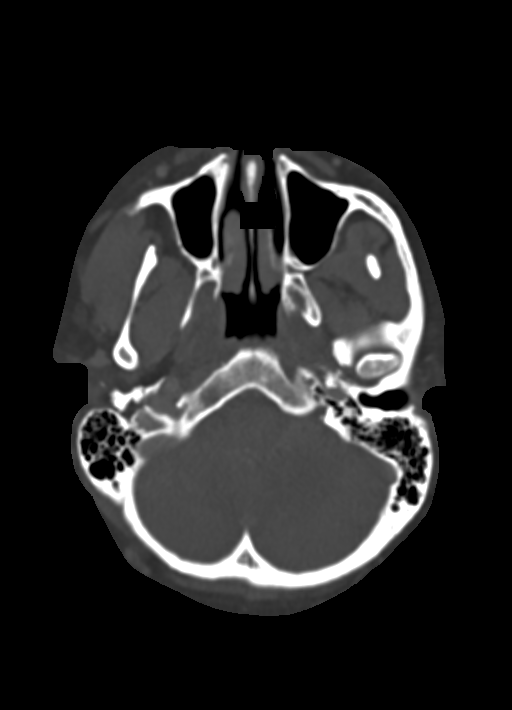

[11 of 35 positions shown; findings below may reference images not displayed]

FINDINGS: Pharynx and larynx: No evidence of mass or swelling. Patent airway.
No fluid collection or inflammatory changes in the parapharyngeal or
retropharyngeal spaces.

Salivary glands: No inflammation, mass, or stone.

Thyroid: Status post left hemithyroidectomy. Unremarkable CT
appearance of the right thyroid lobe.

Lymph nodes: Corresponding to the palpable abnormality in the right
neck is a new 6 mm short axis level V lymph node with moderate
surrounding fat stranding. There is also a 7 mm right level V lymph
node immediately superior to the fat stranding. Multiple right level
IV lymph nodes are all subcentimeter in short axis but have
increased in size from the prior CT with the largest measuring 8 mm.
There are also enlarged right subpectoral and axillary lymph nodes,
more fully evaluated on separate chest CT. No enlarged left-sided
cervical lymph nodes are identified.

Vascular: Major vascular structures of the neck are patent.

Limited intracranial: Mild cerebellar tonsillar ectopia measuring
approximately 5 mm, similar to the prior study.

Visualized orbits: Unremarkable.

Mastoids and visualized paranasal sinuses: Clear.

Skeleton: Mild cervical spondylosis.  No suspicious osseous lesion.

Upper chest: Reported separately.

Other: None.
IMPRESSION: 1. New mild right-sided cervical lymphadenopathy including a 6 mm
level V lymph node with surrounding fat stranding, indeterminate
with both neoplastic and infectious etiologies considered.
2. Right subpectoral and axillary lymphadenopathy, more fully
evaluated on separate chest CT.
3. Status post left hemithyroidectomy.
4. No primary neck mass identified.

## 2021-09-24 ENCOUNTER — Ambulatory Visit (INDEPENDENT_AMBULATORY_CARE_PROVIDER_SITE_OTHER): Payer: Managed Care, Other (non HMO) | Admitting: Family Medicine

## 2021-09-24 ENCOUNTER — Encounter (INDEPENDENT_AMBULATORY_CARE_PROVIDER_SITE_OTHER): Payer: Self-pay | Admitting: Family Medicine

## 2021-09-24 VITALS — HR 69 | Temp 98.1°F | Ht 66.0 in | Wt 228.0 lb

## 2021-09-24 DIAGNOSIS — R7303 Prediabetes: Secondary | ICD-10-CM | POA: Diagnosis not present

## 2021-09-24 DIAGNOSIS — Z6836 Body mass index (BMI) 36.0-36.9, adult: Secondary | ICD-10-CM

## 2021-09-24 DIAGNOSIS — R531 Weakness: Secondary | ICD-10-CM

## 2021-09-24 DIAGNOSIS — E669 Obesity, unspecified: Secondary | ICD-10-CM

## 2021-09-28 NOTE — Progress Notes (Unsigned)
Chief Complaint:   OBESITY Ruth Elliott is here to discuss her progress with her obesity treatment plan along with follow-up of her obesity related diagnoses. Ruth Elliott is on keeping a food journal and adhering to recommended goals of 1500 calories and 100+ grams of protein daily and states she is following her eating plan approximately 80% of the time. Ruth Elliott states she is walking and horse riding for 30 minutes 5 times per week.  Today's visit was #: 46 Starting weight: 223 lbs Starting date: 08/30/2019 Today's weight: 228 lbs Today's date: 09/24/2021 Total lbs lost to date: 0 Total lbs lost since last in-office visit: 0  Interim History: Ruth Elliott is struggling with weight loss.  She notes decreased hunger, but she has been missing meals and this is likely decreasing her RMR.  Subjective:   1. Pre-diabetes Ruth Elliott increased metformin last visit, and she notes decreased polyphagia but she is not eating.  2. Weakness Ruth Elliott notes increased weakness in her legs since her appetite has decreased and her nutrition has decreased.  Assessment/Plan:   1. Pre-diabetes Ruth Elliott is to decrease her dose of metformin until she can eat appropriate amounts.  We will follow-up at her next visit in 3 to 4 weeks.  2. Weakness Ruth Elliott was encouraged to increase her protein and not cut calories below 1400, as she is at risk of sarcopenia.  We will follow-up at her next visit in 3 to 4 weeks.  3. Obesity, Current BMI 36.9 Ruth Elliott is currently in the action stage of change. As such, her goal is to continue with weight loss efforts. She has agreed to the Category 3 Plan or keeping a food journal and adhering to recommended goals of 1500 calories and 100+ grams of protein daily.   Exercise goals: As is.   Behavioral modification strategies: increasing lean protein intake and no skipping meals.  Ruth Elliott has agreed to follow-up with our clinic in 3 to 4 weeks. She was informed of the importance of frequent follow-up visits  to maximize her success with intensive lifestyle modifications for her multiple health conditions.   Objective:   Pulse 69, temperature 98.1 F (36.7 C), height '5\' 6"'$  (1.676 m), weight 228 lb (103.4 kg), SpO2 99 %. Body mass index is 36.8 kg/m.  General: Cooperative, alert, well developed, in no acute distress. HEENT: Conjunctivae and lids unremarkable. Cardiovascular: Regular rhythm.  Lungs: Normal work of breathing. Neurologic: No focal deficits.   Lab Results  Component Value Date   CREATININE 0.72 08/23/2021   BUN 14 08/23/2021   NA 140 08/23/2021   K 4.2 08/23/2021   CL 103 08/23/2021   CO2 20 08/23/2021   Lab Results  Component Value Date   ALT 13 08/23/2021   AST 18 08/23/2021   ALKPHOS 53 08/23/2021   BILITOT 0.7 08/23/2021   Lab Results  Component Value Date   HGBA1C 5.4 08/23/2021   HGBA1C 5.5 03/01/2021   HGBA1C 5.6 01/01/2021   HGBA1C 5.5 10/24/2020   HGBA1C 5.6 05/22/2020   Lab Results  Component Value Date   INSULIN 12.9 08/23/2021   INSULIN 8.2 03/01/2021   INSULIN 13.4 01/01/2021   INSULIN 15.2 10/24/2020   INSULIN 20.8 05/22/2020   Lab Results  Component Value Date   TSH 1.620 08/23/2021   Lab Results  Component Value Date   CHOL 101 08/23/2021   HDL 40 08/23/2021   LDLCALC 42 08/23/2021   TRIG 100 08/23/2021   CHOLHDL 2.5 08/09/2019   Lab Results  Component Value Date   VD25OH 51.7 08/23/2021   VD25OH 48.5 03/01/2021   VD25OH 48.8 01/01/2021   Lab Results  Component Value Date   WBC 9.1 08/23/2021   HGB 13.2 08/23/2021   HCT 39.3 08/23/2021   MCV 98 (H) 08/23/2021   PLT 180 08/23/2021   No results found for: "IRON", "TIBC", "FERRITIN"  Attestation Statements:   Reviewed by clinician on day of visit: allergies, medications, problem list, medical history, surgical history, family history, social history, and previous encounter notes.   I, Trixie Dredge, am acting as transcriptionist for Dennard Nip, MD.  I have  reviewed the above documentation for accuracy and completeness, and I agree with the above. -  Dennard Nip, MD

## 2021-10-16 ENCOUNTER — Ambulatory Visit (INDEPENDENT_AMBULATORY_CARE_PROVIDER_SITE_OTHER): Payer: Managed Care, Other (non HMO) | Admitting: Family Medicine

## 2021-10-22 ENCOUNTER — Ambulatory Visit (INDEPENDENT_AMBULATORY_CARE_PROVIDER_SITE_OTHER): Payer: Managed Care, Other (non HMO) | Admitting: Family Medicine

## 2021-10-24 ENCOUNTER — Ambulatory Visit (INDEPENDENT_AMBULATORY_CARE_PROVIDER_SITE_OTHER): Payer: Managed Care, Other (non HMO) | Admitting: Family Medicine

## 2021-10-24 ENCOUNTER — Other Ambulatory Visit (INDEPENDENT_AMBULATORY_CARE_PROVIDER_SITE_OTHER): Payer: Self-pay | Admitting: Family Medicine

## 2021-10-24 ENCOUNTER — Encounter (INDEPENDENT_AMBULATORY_CARE_PROVIDER_SITE_OTHER): Payer: Self-pay | Admitting: Family Medicine

## 2021-10-24 VITALS — BP 169/88 | HR 75 | Temp 98.2°F | Ht 66.0 in | Wt 230.0 lb

## 2021-10-24 DIAGNOSIS — F32A Depression, unspecified: Secondary | ICD-10-CM | POA: Insufficient documentation

## 2021-10-24 DIAGNOSIS — F3289 Other specified depressive episodes: Secondary | ICD-10-CM | POA: Diagnosis not present

## 2021-10-24 DIAGNOSIS — Z6837 Body mass index (BMI) 37.0-37.9, adult: Secondary | ICD-10-CM | POA: Diagnosis not present

## 2021-10-24 DIAGNOSIS — E669 Obesity, unspecified: Secondary | ICD-10-CM | POA: Diagnosis not present

## 2021-10-24 MED ORDER — TOPIRAMATE 50 MG PO TABS: 50.0000 mg | ORAL_TABLET | Freq: Every day | ORAL | 0 refills | Status: DC

## 2021-10-29 NOTE — Progress Notes (Unsigned)
Chief Complaint:   OBESITY Ruth Elliott is here to discuss her progress with her obesity treatment plan along with follow-up of her obesity related diagnoses. Marizol is on the Category 3 Plan or keeping a food journal and adhering to recommended goals of 1500 calories and 100+ grams of protein daily and states she is following her eating plan approximately 60% of the time. Claudia states she is walking for 25 minutes 5 times per week, and horse riding for 20 minutes 3 times per week.  Today's visit was #: 21 Starting weight: 223 lbs Starting date: 08/30/2019 Today's weight: 230 lbs Today's date: 10/24/2021 Total lbs lost to date: 0 Total lbs lost since last in-office visit: 0  Interim History: Ruth Elliott has been retaining fluid. She has not been able to meal plan as much. She has been working longer hours at work and her sleep is not adequate.   Subjective:   1. Other depression with emotionl eating [name] is struggling with stress and following her eating plan. She is open to medications to help her. She denies a history of nephrolithiasis, and she is menopausal.   Assessment/Plan:   1. Other depression with emotionl eating Ruth Elliott agreed to start topiramate 50 mg once daily with no refills. We will follow-up at her next visit.   - topiramate (TOPAMAX) 50 MG tablet; Take 1 tablet (50 mg total) by mouth daily.  Dispense: 30 tablet; Refill: 0  2. Obesity,current BMI 37.2 [name] is currently in the action stage of change. As such, her goal is to continue with weight loss efforts. She has agreed to keeping a food journal and adhering to recommended goals of 1500 calories and 100+ grams of protein daily.   Exercise goals: As is.   Behavioral modification strategies: increasing lean protein intake and meal planning and cooking strategies.  Ruth Elliott has agreed to follow-up with our clinic in 4 weeks. She was informed of the importance of frequent follow-up visits to maximize her success with intensive  lifestyle modifications for her multiple health conditions.   Objective:   Blood pressure (!) 169/88, pulse 75, temperature 98.2 F (36.8 C), height '5\' 6"'$  (1.676 m), weight 230 lb (104.3 kg), SpO2 98 %. Body mass index is 37.12 kg/m.  General: Cooperative, alert, well developed, in no acute distress. HEENT: Conjunctivae and lids unremarkable. Cardiovascular: Regular rhythm.  Lungs: Normal work of breathing. Neurologic: No focal deficits.   Lab Results  Component Value Date   CREATININE 0.72 08/23/2021   BUN 14 08/23/2021   NA 140 08/23/2021   K 4.2 08/23/2021   CL 103 08/23/2021   CO2 20 08/23/2021   Lab Results  Component Value Date   ALT 13 08/23/2021   AST 18 08/23/2021   ALKPHOS 53 08/23/2021   BILITOT 0.7 08/23/2021   Lab Results  Component Value Date   HGBA1C 5.4 08/23/2021   HGBA1C 5.5 03/01/2021   HGBA1C 5.6 01/01/2021   HGBA1C 5.5 10/24/2020   HGBA1C 5.6 05/22/2020   Lab Results  Component Value Date   INSULIN 12.9 08/23/2021   INSULIN 8.2 03/01/2021   INSULIN 13.4 01/01/2021   INSULIN 15.2 10/24/2020   INSULIN 20.8 05/22/2020   Lab Results  Component Value Date   TSH 1.620 08/23/2021   Lab Results  Component Value Date   CHOL 101 08/23/2021   HDL 40 08/23/2021   LDLCALC 42 08/23/2021   TRIG 100 08/23/2021   CHOLHDL 2.5 08/09/2019   Lab Results  Component Value Date  VD25OH 51.7 08/23/2021   VD25OH 48.5 03/01/2021   VD25OH 48.8 01/01/2021   Lab Results  Component Value Date   WBC 9.1 08/23/2021   HGB 13.2 08/23/2021   HCT 39.3 08/23/2021   MCV 98 (H) 08/23/2021   PLT 180 08/23/2021   No results found for: "IRON", "TIBC", "FERRITIN"  Attestation Statements:   Reviewed by clinician on day of visit: allergies, medications, problem list, medical history, surgical history, family history, social history, and previous encounter notes.  Time spent on visit including pre-visit chart review and post-visit care and charting was 32  minutes.   I, Trixie Dredge, am acting as transcriptionist for Dennard Nip, MD.  I have reviewed the above documentation for accuracy and completeness, and I agree with the above. -  Dennard Nip, MD

## 2021-11-10 NOTE — Progress Notes (Unsigned)
Cardiology Office Note  Date:  11/12/2021   ID:  Ruth Elliott, DOB 18-Jun-1970, MRN [id number]  PCP:  Eugenia Pancoast, FNP   Chief Complaint  Patient presents with   12 month follow up     "Doing well." Medications reviewed by the patient verbally.     HPI:  [name] is a 51 year old woman with past medical history of Nodular goiter Recent thyroid surgery with Dr. Tami Ribas Who presents for evaluation of her hypertension  In follow-up today reports she is doing well BP at home 115/60s, well controlled 120s at work Has been cutting her valsartan 160 mg pill into 3 pieces, takes 1 piece daily Continues on amlodipine 5 Blood pressure mildly elevated in the office today, whitecoat  Continues to work at [employer]  Cholesterol well controlled on no medications, non-smoker Active, rides horses  History of allergy issues, followed by ENT  EKG personally reviewed by myself on todays visit Shows normal sinus rhythm with rate 72 bpm nonspecific T wave abnormality 1 and aV, unchanged seen previously on EKG 2017  Of past medical history reviewed BP elevated prior to and following thyroid surgery 701 systolic and higher Given Hydralazine IV, 3 doses post op by anesthesia. Down to 779 systolic with standing at the time of discharge No significant surgical pain reported At 7 PM, patient at home, systolic pressure back up to 390 systolic   She was started on hydralazine 50 mg TID PRN for systolic pressure >300 Side effects on the hydralazine, had headache  CT scan reviewed showing no carotid atherosclerosis, no aortic arch atherosclerosis  Cholesterol very low on no medication, non-smoker, nondiabetic  PMH:   has a past medical history of Allergic rhinitis, Allergy, Back pain, Endometriosis, Joint pain, Obesity, Thyroid disease, and White coat syndrome with hypertension.  PSH:    Past Surgical History:  Procedure Laterality Date   APPENDECTOMY     NASAL SINUS SURGERY      OVARIAN CYST SURGERY     THYROIDECTOMY Left 11/17/2018   Procedure: THYROIDECTOMY-Hemi Thyroidectomy;  Surgeon: Beverly Gust, MD;  Location: [facility] ORS;  Service: ENT;  Laterality: Left;   TONSILLECTOMY  1994    Current Outpatient Medications  Medication Sig Dispense Refill   B Complex-C-Folic Acid (HM SUPER VITAMIN B COMPLEX/C PO) Take by mouth.     cetirizine (ZYRTEC) 10 MG chewable tablet Chew 10 mg by mouth daily.     cholecalciferol (VITAMIN D3) 25 MCG (1000 UNIT) tablet Take 1,000 Units by mouth daily.     COLLAGEN PO Take 1 Scoop by mouth daily. Collagen Peptide in coffee in am     Flax Oil-Fish Oil-Borage Oil (FISH OIL-FLAX OIL-BORAGE OIL) CAPS Take 2 capsules by mouth daily.      fluticasone (FLONASE) 50 MCG/ACT nasal spray SHAKE LIQUID AND USE 2 SPRAYS IN EACH NOSTRIL DAILY 16 g 1   ibuprofen (ADVIL) 400 MG tablet Take 400 mg by mouth every 6 (six) hours as needed.     metFORMIN (GLUCOPHAGE) 500 MG tablet Take 1 tablet (500 mg total) by mouth 2 (two) times daily with a meal. 180 tablet 0   Multiple Vitamin (MULTIVITAMIN) capsule Take 1 capsule by mouth daily.     norethindrone-ethinyl estradiol (MICROGESTIN,JUNEL,LOESTRIN) 1-20 MG-MCG tablet Take 1 tablet by mouth daily.  0   valsartan (DIOVAN) 160 MG tablet Take 1/3 tablet po qd 90 tablet 3   valsartan (DIOVAN) 80 MG tablet Take 1 tablet (80 mg total) by mouth daily. 90 tablet  3   amLODipine (NORVASC) 5 MG tablet TAKE 1 TABLET(5 MG) BY MOUTH DAILY 90 tablet 3   topiramate (TOPAMAX) 50 MG tablet Take 1 tablet (50 mg total) by mouth daily. (Patient not taking: Reported on 11/12/2021) 30 tablet 0   No current facility-administered medications for this visit.     Allergies:   Penicillins, Hydralazine, Lisinopril, and Singulair [montelukast sodium]   Social History:  The patient  reports that she has never smoked. She has never used smokeless tobacco. She reports current alcohol use. She reports that she does not use drugs.    Family History:   family history includes Alzheimer's disease in her father; COPD in her mother; Diabetes in her mother; Heart disease in her mother; Hypertension in her father and mother.    Review of Systems: Review of Systems  Constitutional: Negative.   HENT: Negative.    Respiratory: Negative.    Cardiovascular: Negative.   Gastrointestinal: Negative.   Musculoskeletal: Negative.   Neurological: Negative.   Psychiatric/Behavioral: Negative.    All other systems reviewed and are negative.   PHYSICAL EXAM: VS:  BP (!) 140/92 (BP Location: Left Arm, Patient Position: Sitting, Cuff Size: Normal)   Pulse 72   Ht '5\' 6"'$  (1.676 m)   Wt 234 lb 6 oz (106.3 kg)   SpO2 98%   BMI 37.83 kg/m  , BMI Body mass index is 37.83 kg/m. Constitutional:  oriented to person, place, and time. No distress.  HENT:  Head: Grossly normal Eyes:  no discharge. No scleral icterus.  Neck: No JVD, no carotid bruits  Cardiovascular: Regular rate and rhythm, no murmurs appreciated Pulmonary/Chest: Clear to auscultation bilaterally, no wheezes or rails Abdominal: Soft.  no distension.  no tenderness.  Musculoskeletal: Normal range of motion Neurological:  normal muscle tone. Coordination normal. No atrophy Skin: Skin warm and dry Psychiatric: normal affect, pleasant  Recent Labs: 08/23/2021: ALT 13; BUN 14; Creatinine, Ser 0.72; Hemoglobin 13.2; Platelets 180; Potassium 4.2; Sodium 140; TSH 1.620    Lipid Panel Lab Results  Component Value Date   CHOL 101 08/23/2021   HDL 40 08/23/2021   LDLCALC 42 08/23/2021   TRIG 100 08/23/2021      Wt Readings from Last 3 Encounters:  11/12/21 234 lb 6 oz (106.3 kg)  10/24/21 230 lb (104.3 kg)  09/24/21 228 lb (103.4 kg)       ASSESSMENT AND PLAN:  Problem List Items Addressed This Visit       Cardiology Problems   Essential hypertension - Primary   Relevant Medications   amLODipine (NORVASC) 5 MG tablet   valsartan (DIOVAN) 80 MG tablet    Other Relevant Orders   EKG 12-Lead     Other   Pre-diabetes   Relevant Orders   EKG 12-Lead  Hypertension Recommend we decrease dose of valsartan, new prescription for 80 mg daily She was cutting the 160 mg pill into 3 pieces, taking 1 with amlodipine 5 Suggested if blood pressure runs low we could decrease amlodipine or valsartan even further  Cardiac risk factors Non-smoker, diabetes numbers well controlled, cholesterol well controlled No for her testing needed  Goiter Prior resection of cyst Followed by endocrine TSH within normal range   Total encounter time more than 15 minutes  Greater than 50% was spent in counseling and coordination of care with the patient    Signed, Esmond Plants, M.D., Ph.D. [place], [place]

## 2021-11-12 ENCOUNTER — Encounter: Payer: Self-pay | Admitting: Cardiovascular Disease

## 2021-11-12 ENCOUNTER — Ambulatory Visit: Payer: Managed Care, Other (non HMO) | Attending: Cardiovascular Disease | Admitting: Cardiovascular Disease

## 2021-11-12 VITALS — BP 140/92 | HR 72 | Ht 66.0 in | Wt 234.4 lb

## 2021-11-12 DIAGNOSIS — I10 Essential (primary) hypertension: Secondary | ICD-10-CM | POA: Diagnosis not present

## 2021-11-12 DIAGNOSIS — R7303 Prediabetes: Secondary | ICD-10-CM

## 2021-11-12 MED ORDER — VALSARTAN 80 MG PO TABS: 80.0000 mg | ORAL_TABLET | Freq: Every day | ORAL | 3 refills | Status: DC

## 2021-11-12 MED ORDER — AMLODIPINE BESYLATE 5 MG PO TABS: ORAL_TABLET | ORAL | 3 refills | Status: DC

## 2021-11-12 NOTE — Patient Instructions (Signed)
Medication Instructions:  Try the valsartan 80 mg daily IF BP runs low, We may need to decrease dose to 40 mg or reduce the amlodipine down to 2.5 mg daily  If you need a refill on your cardiac medications before your next appointment, please call your pharmacy.   Lab work: No new labs needed  Testing/Procedures: No new testing needed  Follow-Up: At [facility], you and your health needs are our priority.  As part of our continuing mission to provide you with exceptional heart care, we have created designated Provider Care Teams.  These Care Teams include your primary Cardiologist (physician) and Advanced Practice Providers (APPs -  Physician Assistants and Nurse Practitioners) who all work together to provide you with the care you need, when you need it.  You will need a follow up appointment in 12 months  Providers on your designated Care Team:   Murray Hodgkins, NP Christell Faith, PA-C Cadence Kathlen Mody, [place]  COVID-19 Vaccine Information can be found at: ShippingScam.co.uk For questions related to vaccine distribution or appointments, please email [email or web address] .[email or web address] or call [phone].

## 2021-11-19 ENCOUNTER — Encounter (INDEPENDENT_AMBULATORY_CARE_PROVIDER_SITE_OTHER): Payer: Self-pay | Admitting: Family Medicine

## 2021-11-19 ENCOUNTER — Ambulatory Visit (INDEPENDENT_AMBULATORY_CARE_PROVIDER_SITE_OTHER): Payer: Managed Care, Other (non HMO) | Admitting: Family Medicine

## 2021-11-19 VITALS — BP 167/85 | HR 73 | Temp 98.5°F | Ht 66.0 in | Wt 231.0 lb

## 2021-11-19 DIAGNOSIS — R7303 Prediabetes: Secondary | ICD-10-CM

## 2021-11-19 DIAGNOSIS — Z6837 Body mass index (BMI) 37.0-37.9, adult: Secondary | ICD-10-CM | POA: Diagnosis not present

## 2021-11-19 DIAGNOSIS — E669 Obesity, unspecified: Secondary | ICD-10-CM

## 2021-11-19 DIAGNOSIS — I10 Essential (primary) hypertension: Secondary | ICD-10-CM | POA: Diagnosis not present

## 2021-11-27 NOTE — Progress Notes (Signed)
Chief Complaint:   OBESITY Ruth Elliott is here to discuss her progress with her obesity treatment plan along with follow-up of her obesity related diagnoses. Ruth Elliott is on keeping a food journal and adhering to recommended goals of 1500 calories and 100+ grams of protein daily and states she is following her eating plan approximately 60% of the time. Ruth Elliott states she is walking for 20-30 minutes 4 times per week.  Today's visit was #: 33 Starting weight: 223 lbs Starting date: 08/30/2019 Today's weight: 231 lbs Today's date: 11/19/2021 Total lbs lost to date: 0 Total lbs lost since last in-office visit: 0  Interim History: Ruth Elliott has done well with her weight loss overall.  She is struggling at times and we will plan to recheck her IC at her next visit.   Subjective:   1. Prediabetes Ruth Elliott's last A1c was 5.4 and insulin 12.9. She is taking metformin BID, with no side effects noted.   2. White coat syndrome with hypertension Ruth Elliott's blood pressure at home ranges between 100-110's/60-70 at home with cuff calibrated in Cardiologist office. She saw Cardiology last week and planning to decrease medications if she continues to do well. No side effects were noted with her medications.   Assessment/Plan:   1. Prediabetes Ruth Elliott will continue with her diet, exercise, and metformin. We will recheck fasting IC at her next visit.   2. White coat syndrome with hypertension Ruth Elliott will continue with her diet and exercise.   3. Obesity with current BMI of 37.3 Ruth Elliott is currently in the action stage of change. As such, her goal is to continue with weight loss efforts. She has agreed to the Category 3 Plan or keeping a food journal and adhering to recommended goals of 1500 calories and 100+ grams of protein daily.   We will repeat fasting IC at her next visit, and patient is to show up 30 minute prior to her visit.   Exercise goals: As is.   Behavioral modification strategies: increasing lean protein  intake, decreasing simple carbohydrates, meal planning and cooking strategies, and holiday eating strategies .  Ruth Elliott has agreed to follow-up with our clinic in 4 weeks. She was informed of the importance of frequent follow-up visits to maximize her success with intensive lifestyle modifications for her multiple health conditions.   Objective:   Blood pressure (!) 167/85, pulse 73, temperature 98.5 F (36.9 C), height '5\' 6"'$  (1.676 m), weight 231 lb (104.8 kg), SpO2 97 %. Body mass index is 37.28 kg/m.  General: Cooperative, alert, well developed, in no acute distress. HEENT: Conjunctivae and lids unremarkable. Cardiovascular: Regular rhythm.  Lungs: Normal work of breathing. Neurologic: No focal deficits.   Lab Results  Component Value Date   CREATININE 0.72 08/23/2021   BUN 14 08/23/2021   NA 140 08/23/2021   K 4.2 08/23/2021   CL 103 08/23/2021   CO2 20 08/23/2021   Lab Results  Component Value Date   ALT 13 08/23/2021   AST 18 08/23/2021   ALKPHOS 53 08/23/2021   BILITOT 0.7 08/23/2021   Lab Results  Component Value Date   HGBA1C 5.4 08/23/2021   HGBA1C 5.5 03/01/2021   HGBA1C 5.6 01/01/2021   HGBA1C 5.5 10/24/2020   HGBA1C 5.6 05/22/2020   Lab Results  Component Value Date   INSULIN 12.9 08/23/2021   INSULIN 8.2 03/01/2021   INSULIN 13.4 01/01/2021   INSULIN 15.2 10/24/2020   INSULIN 20.8 05/22/2020   Lab Results  Component Value Date  TSH 1.620 08/23/2021   Lab Results  Component Value Date   CHOL 101 08/23/2021   HDL 40 08/23/2021   LDLCALC 42 08/23/2021   TRIG 100 08/23/2021   CHOLHDL 2.5 08/09/2019   Lab Results  Component Value Date   VD25OH 51.7 08/23/2021   VD25OH 48.5 03/01/2021   VD25OH 48.8 01/01/2021   Lab Results  Component Value Date   WBC 9.1 08/23/2021   HGB 13.2 08/23/2021   HCT 39.3 08/23/2021   MCV 98 (H) 08/23/2021   PLT 180 08/23/2021   No results found for: "IRON", "TIBC", "FERRITIN"  Attestation Statements:    Reviewed by clinician on day of visit: allergies, medications, problem list, medical history, surgical history, family history, social history, and previous encounter notes.   I, Trixie Dredge, am acting as transcriptionist for Dennard Nip, MD.  I have reviewed the above documentation for accuracy and completeness, and I agree with the above. -  Dennard Nip, MD

## 2021-11-29 ENCOUNTER — Telehealth (INDEPENDENT_AMBULATORY_CARE_PROVIDER_SITE_OTHER): Payer: Managed Care, Other (non HMO) | Admitting: Family

## 2021-11-29 ENCOUNTER — Encounter: Payer: Self-pay | Admitting: Family

## 2021-11-29 ENCOUNTER — Ambulatory Visit: Payer: Managed Care, Other (non HMO) | Admitting: Family

## 2021-11-29 VITALS — BP 113/70 | Temp 99.5°F | Ht 66.0 in | Wt 231.0 lb

## 2021-11-29 DIAGNOSIS — U071 COVID-19: Secondary | ICD-10-CM | POA: Diagnosis not present

## 2021-11-29 MED ORDER — MOLNUPIRAVIR EUA 200MG CAPSULE: 4.0000 | ORAL_CAPSULE | Freq: Two times a day (BID) | ORAL | 0 refills | Status: AC

## 2021-11-29 NOTE — Patient Instructions (Signed)
Your COVID test is positive. You should remain isolated and quarantine for at least 5 days from start of symptoms. You must be feeling better and be fever free without any fever reducers for at least 24 hours as well. You should wear a mask at all times when out of your home or around others for 5 days after leaving isolation.  Your household contacts should be tested as well as work contacts. If you feel worse or have increasing shortness of breath, you should be seen in person at urgent care or the emergency room.   Due to recent changes in healthcare laws, you may see results of your imaging and/or laboratory studies on MyChart before I have had a chance to review them.  I understand that in some cases there may be results that are confusing or concerning to you. Please understand that not all results are received at the same time and often I may need to interpret multiple results in order to provide you with the best plan of care or course of treatment. Therefore, I ask that you please give me 2 business days to thoroughly review all your results before contacting my office for clarification. Should we see a critical lab result, you will be contacted sooner.   It was a pleasure seeing you today! Please do not hesitate to reach out with any questions and or concerns.  Regards,   Eugenia Pancoast FNP-C

## 2021-11-29 NOTE — Progress Notes (Signed)
MyChart Video Visit    Virtual Visit via Video Note   This visit type was conducted due to national recommendations for restrictions regarding the COVID-19 Pandemic (e.g. social distancing) in an effort to limit this patient's exposure and mitigate transmission in our community. This patient is at least at moderate risk for complications without adequate follow up. This format is felt to be most appropriate for this patient at this time. Physical exam was limited by quality of the video and audio technology used for the visit. CMA was able to get the patient set up on a video visit.  Patient location: Home. Patient and provider in visit Provider location: Office  I discussed the limitations of evaluation and management by telemedicine and the availability of in person appointments. The patient expressed understanding and agreed to proceed.  Visit Date: 11/29/2021  Today's healthcare provider: Eugenia Pancoast, FNP     Subjective:    Patient ID: Ruth Elliott, female    DOB: [birth year], 51 y.o.   MRN: [id number]  Chief Complaint  Patient presents with   URI   Allergies    HPI  Pt here today via video visit with concerns.   Tested positive for covid today.  Symptoms started three days ago.  C/o nasal congestion, sinus pressure, runny nose,  Slight elevated temperature at 99 F With sore throat and bil ear pain as well. Slight chest congestion but not bad. Dry non productive cough.   Mucinex has been helpful.  Past Medical History:  Diagnosis Date   Allergic rhinitis    Allergy    Back pain    Endometriosis    Followed by [facility]- on Nortrel   Joint pain    Obesity    Thyroid disease    White coat syndrome with hypertension     Past Surgical History:  Procedure Laterality Date   APPENDECTOMY     NASAL SINUS SURGERY     OVARIAN CYST SURGERY     THYROIDECTOMY Left 11/17/2018   Procedure: THYROIDECTOMY-Hemi Thyroidectomy;  Surgeon: Beverly Gust, MD;   Location: [facility] ORS;  Service: ENT;  Laterality: Left;   TONSILLECTOMY  1994    Family History  Problem Relation Age of Onset   COPD Mother    Hypertension Mother    Diabetes Mother    Heart disease Mother    Alzheimer's disease Father    Hypertension Father     Social History   Socioeconomic History   Marital status: Single    Spouse name: Not on file   Number of children: Not on file   Years of education: Not on file   Highest education level: Not on file  Occupational History   Occupation: IT sales professional supvervisor  Tobacco Use   Smoking status: Never   Smokeless tobacco: Never  Vaping Use   Vaping Use: Never used  Substance and Sexual Activity   Alcohol use: Yes    Alcohol/week: 0.0 standard drinks of alcohol    Comment: 1-2 per month beer   Drug use: No   Sexual activity: Not Currently  Other Topics Concern   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  Recently moved here from [place].   G0   Single , lives alone, 2 dogs and 2 horses    Social Determinants of Health   Financial Resource Strain: Not on file  Food Insecurity: Not on file  Transportation Needs: Not on file  Physical Activity:  Not on file  Stress: Not on file  Social Connections: Not on file  Intimate Partner Violence: Not on file    Outpatient Medications Prior to Visit  Medication Sig Dispense Refill   amLODipine (NORVASC) 5 MG tablet TAKE 1 TABLET(5 MG) BY MOUTH DAILY 90 tablet 3   B Complex-C-Folic Acid (HM SUPER VITAMIN B COMPLEX/C PO) Take by mouth.     cetirizine (ZYRTEC) 10 MG chewable tablet Chew 10 mg by mouth daily.     cholecalciferol (VITAMIN D3) 25 MCG (1000 UNIT) tablet Take 1,000 Units by mouth daily.     COLLAGEN PO Take 1 Scoop by mouth daily. Collagen Peptide in coffee in am     Flax Oil-Fish Oil-Borage Oil (FISH OIL-FLAX OIL-BORAGE OIL) CAPS Take 2 capsules by mouth daily.      fluticasone (FLONASE) 50 MCG/ACT nasal spray  SHAKE LIQUID AND USE 2 SPRAYS IN EACH NOSTRIL DAILY 16 g 1   ibuprofen (ADVIL) 400 MG tablet Take 400 mg by mouth every 6 (six) hours as needed.     metFORMIN (GLUCOPHAGE) 500 MG tablet Take 1 tablet (500 mg total) by mouth 2 (two) times daily with a meal. 180 tablet 0   Multiple Vitamin (MULTIVITAMIN) capsule Take 1 capsule by mouth daily.     norethindrone-ethinyl estradiol (MICROGESTIN,JUNEL,LOESTRIN) 1-20 MG-MCG tablet Take 1 tablet by mouth daily.  0   valsartan (DIOVAN) 160 MG tablet Take 1/3 tablet po qd 90 tablet 3   valsartan (DIOVAN) 80 MG tablet Take 1 tablet (80 mg total) by mouth daily. 90 tablet 3   topiramate (TOPAMAX) 50 MG tablet Take 1 tablet (50 mg total) by mouth daily. 30 tablet 0   No facility-administered medications prior to visit.    Allergies  Allergen Reactions   Penicillins Shortness Of Breath    Did it involve swelling of the face/tongue/throat, SOB, or low BP? Yes Did it involve sudden or severe rash/hives, skin peeling, or any reaction on the inside of your mouth or nose? No Did you need to seek medical attention at a hospital or doctor's office? Yes When did it last happen?   childhood    If all above answers are "NO", may proceed with cephalosporin use.    Hydralazine     Elevated blood pressure, migraine, and lethary   Lisinopril     paresthesia   Singulair [Montelukast Sodium]     Moody    Review of Systems     Objective:    Physical Exam Constitutional:      General: She is not in acute distress.    Appearance: Normal appearance. She is obese. She is not ill-appearing or toxic-appearing.  Pulmonary:     Effort: Pulmonary effort is normal.  Neurological:     General: No focal deficit present.     Mental Status: She is alert and oriented to person, place, and time. Mental status is at baseline.  Psychiatric:        Mood and Affect: Mood normal.        Behavior: Behavior normal.        Thought Content: Thought content normal.         Judgment: Judgment normal.     BP 113/70   Temp 99.5 F (37.5 C)   Ht '5\' 6"'$  (1.676 m)   Wt 231 lb (104.8 kg)   SpO2 93%   BMI 37.28 kg/m  Wt Readings from Last 3 Encounters:  11/29/21 231 lb (104.8 kg)  11/19/21 231  lb (104.8 kg)  11/12/21 234 lb 6 oz (106.3 kg)       Assessment & Plan:   Problem List Items Addressed This Visit       Other   COVID-19 - Primary    Pt considered high risk for hospitalization. have decided pt is a candidate for antiviral and pt agrees that she would like to take this. I have sent in RX for molnupiravir 200 mg capsules to be taken as directed.  Advised of CDC guidelines for self isolation/ ending isolation.  Advised of safe practice guidelines. Symptom Tier reviewed.  Encouraged to monitor for any worsening symptoms; watch for increased shortness of breath, weakness, and signs of dehydration. Advised when to seek emergency care.  Instructed to rest and hydrate well.  Advised to leave the house during recommended isolation period, only if it is necessary to seek medical care        Relevant Medications   molnupiravir EUA (LAGEVRIO) 200 mg CAPS capsule    I have discontinued Diamon L. Nordhoff's topiramate. I am also having her start on molnupiravir EUA. Additionally, I am having her maintain her Fish Oil-Flax Oil-Borage Oil, norethindrone-ethinyl estradiol, COLLAGEN PO, fluticasone, cholecalciferol, multivitamin, ibuprofen, cetirizine, valsartan, B Complex-C-Folic Acid (HM SUPER VITAMIN B COMPLEX/C PO), metFORMIN, amLODipine, and valsartan.  Meds ordered this encounter  Medications   molnupiravir EUA (LAGEVRIO) 200 mg CAPS capsule    Sig: Take 4 capsules (800 mg total) by mouth 2 (two) times daily for 5 days.    Dispense:  40 capsule    Refill:  0    Order Specific Question:   Supervising Provider    Answer:   BEDSOLE, AMY E [[id number]]    I discussed the assessment and treatment plan with the patient. The patient was provided an opportunity to  ask questions and all were answered. The patient agreed with the plan and demonstrated an understanding of the instructions.   The patient was advised to call back or seek an in-person evaluation if the symptoms worsen or if the condition fails to improve as anticipated.  I provided 15 minutes of face-to-face time during this encounter.   Eugenia Pancoast, [facility] [place] [phone] (phone) [phone] (fax)  [place]

## 2021-11-29 NOTE — Assessment & Plan Note (Addendum)
Pt considered high risk for hospitalization. have decided pt is a candidate for antiviral and pt agrees that she would like to take this. I have sent in RX for molnupiravir 200 mg capsules to be taken as directed. ?Advised of CDC guidelines for self isolation/ ending isolation.  Advised of safe practice guidelines. Symptom Tier reviewed.  Encouraged to monitor for any worsening symptoms; watch for increased shortness of breath, weakness, and signs of dehydration. Advised when to seek emergency care.  Instructed to rest and hydrate well.  Advised to leave the house during recommended isolation period, only if it is necessary to seek medical care ? ? ? ?

## 2021-12-24 ENCOUNTER — Ambulatory Visit (INDEPENDENT_AMBULATORY_CARE_PROVIDER_SITE_OTHER): Payer: Managed Care, Other (non HMO) | Admitting: Family Medicine

## 2022-01-03 ENCOUNTER — Other Ambulatory Visit (INDEPENDENT_AMBULATORY_CARE_PROVIDER_SITE_OTHER): Payer: Self-pay | Admitting: Family Medicine

## 2022-01-03 ENCOUNTER — Encounter (INDEPENDENT_AMBULATORY_CARE_PROVIDER_SITE_OTHER): Payer: Self-pay | Admitting: Family Medicine

## 2022-01-03 DIAGNOSIS — R7303 Prediabetes: Secondary | ICD-10-CM

## 2022-01-23 ENCOUNTER — Ambulatory Visit (INDEPENDENT_AMBULATORY_CARE_PROVIDER_SITE_OTHER): Payer: Managed Care, Other (non HMO) | Admitting: Family Medicine

## 2022-01-30 ENCOUNTER — Ambulatory Visit (INDEPENDENT_AMBULATORY_CARE_PROVIDER_SITE_OTHER): Payer: 59 | Admitting: Family Medicine

## 2022-01-30 ENCOUNTER — Encounter (INDEPENDENT_AMBULATORY_CARE_PROVIDER_SITE_OTHER): Payer: Self-pay | Admitting: Family Medicine

## 2022-01-30 VITALS — BP 159/91 | HR 77 | Temp 97.7°F | Ht 66.0 in | Wt 232.0 lb

## 2022-01-30 DIAGNOSIS — R202 Paresthesia of skin: Secondary | ICD-10-CM

## 2022-01-30 DIAGNOSIS — R7303 Prediabetes: Secondary | ICD-10-CM

## 2022-01-30 DIAGNOSIS — I10 Essential (primary) hypertension: Secondary | ICD-10-CM | POA: Diagnosis not present

## 2022-01-30 DIAGNOSIS — E669 Obesity, unspecified: Secondary | ICD-10-CM

## 2022-01-30 DIAGNOSIS — Z6837 Body mass index (BMI) 37.0-37.9, adult: Secondary | ICD-10-CM

## 2022-01-30 DIAGNOSIS — E559 Vitamin D deficiency, unspecified: Secondary | ICD-10-CM | POA: Diagnosis not present

## 2022-01-30 DIAGNOSIS — R0602 Shortness of breath: Secondary | ICD-10-CM | POA: Diagnosis not present

## 2022-01-30 MED ORDER — METFORMIN HCL 500 MG PO TABS: 500.0000 mg | ORAL_TABLET | Freq: Two times a day (BID) | ORAL | 0 refills | Status: DC

## 2022-02-05 NOTE — Progress Notes (Unsigned)
Chief Complaint:   OBESITY Ruth Elliott is here to discuss her progress with her obesity treatment plan along with follow-up of her obesity related diagnoses. [name] is on {MWMwtlossportion/plan2:23431} and states she is following her eating plan approximately ***% of the time. [name] states she is *** *** minutes *** times per week.  Today's visit was #: *** Starting weight: *** Starting date: *** Today's weight: *** Today's date: 01/30/2022 Total lbs lost to date: *** Total lbs lost since last in-office visit: ***  Interim History: ***  Subjective:   1. SOBOE (shortness of breath on exertion) ***  2. Pre-diabetes ***  3. White coat syndrome with hypertension ***  4. Vitamin D deficiency ***  5. Paresthesias ***  Assessment/Plan:   1. SOBOE (shortness of breath on exertion) ***  2. Pre-diabetes *** - metFORMIN (GLUCOPHAGE) 500 MG tablet; Take 1 tablet (500 mg total) by mouth 2 (two) times daily with a meal.  Dispense: 180 tablet; Refill: 0 - CMP14+EGFR - Hemoglobin A1c - Insulin, random - Lipid Panel With LDL/HDL Ratio  3. White coat syndrome with hypertension ***  4. Vitamin D deficiency ***  5. Paresthesias *** - Vitamin B12 - CBC with Differential/Platelet - Folate - Iron and TIBC - Ferritin - Magnesium - Phosphorus - TSH - Vitamin B3 - Vitamin B6  6. Obesity, Current BMI 37.5 Ruth Elliott is currently in the action stage of change. As such, her goal is to continue with weight loss efforts. She has agreed to the Category 3 Plan or keeping a food journal and adhering to recommended goals of 1500 calories and 100+ grams of protein daily.   Exercise goals: As is.   Behavioral modification strategies: increasing lean protein intake.  Ruth Elliott has agreed to follow-up with our clinic in 3 to 4 weeks. She was informed of the importance of frequent follow-up visits to maximize her success with intensive lifestyle modifications for her multiple health conditions.    Ruth Elliott was informed we would discuss her lab results at her next visit unless there is a critical issue that needs to be addressed sooner. Ruth Elliott agreed to keep her next visit at the agreed upon time to discuss these results.  Objective:   Blood pressure (!) 159/91, pulse 77, temperature 97.7 F (36.5 C), height '5\' 6"'$  (1.676 m), weight 232 lb (105.2 kg), SpO2 97 %. Body mass index is 37.45 kg/m.  General: Cooperative, alert, well developed, in no acute distress. HEENT: Conjunctivae and lids unremarkable. Cardiovascular: Regular rhythm.  Lungs: Normal work of breathing. Neurologic: No focal deficits.   Lab Results  Component Value Date   CREATININE 0.65 01/30/2022   BUN 14 01/30/2022   NA 139 01/30/2022   K 4.4 01/30/2022   CL 104 01/30/2022   CO2 18 (L) 01/30/2022   Lab Results  Component Value Date   ALT 22 01/30/2022   AST 29 01/30/2022   ALKPHOS 64 01/30/2022   BILITOT 0.9 01/30/2022   Lab Results  Component Value Date   HGBA1C 5.6 01/30/2022   HGBA1C 5.4 08/23/2021   HGBA1C 5.5 03/01/2021   HGBA1C 5.6 01/01/2021   HGBA1C 5.5 10/24/2020   Lab Results  Component Value Date   INSULIN 22.3 01/30/2022   INSULIN 12.9 08/23/2021   INSULIN 8.2 03/01/2021   INSULIN 13.4 01/01/2021   INSULIN 15.2 10/24/2020   Lab Results  Component Value Date   TSH 1.720 01/30/2022   Lab Results  Component Value Date   CHOL 100 01/30/2022  HDL 38 (L) 01/30/2022   LDLCALC 43 01/30/2022   TRIG 100 01/30/2022   CHOLHDL 2.5 08/09/2019   Lab Results  Component Value Date   VD25OH 51.7 08/23/2021   VD25OH 48.5 03/01/2021   VD25OH 48.8 01/01/2021   Lab Results  Component Value Date   WBC 9.2 01/30/2022   HGB 14.1 01/30/2022   HCT 42.0 01/30/2022   MCV 98 (H) 01/30/2022   PLT 180 01/30/2022   Lab Results  Component Value Date   IRON 172 (H) 01/30/2022   TIBC 309 01/30/2022   FERRITIN 239 (H) 01/30/2022   Attestation Statements:   Reviewed by clinician on day of  visit: allergies, medications, problem list, medical history, surgical history, family history, social history, and previous encounter notes.  Time spent on visit including pre-visit chart review and post-visit care and charting was 42 minutes.   I, Trixie Dredge, am acting as transcriptionist for Dennard Nip, MD.  I have reviewed the above documentation for accuracy and completeness, and I agree with the above. -  ***

## 2022-02-09 LAB — CBC WITH DIFFERENTIAL/PLATELET
Basophils Absolute: 0.1 10*3/uL (ref 0.0–0.2)
Basos: 1 %
EOS (ABSOLUTE): 0.1 10*3/uL (ref 0.0–0.4)
Eos: 1 %
Hematocrit: 42 % (ref 34.0–46.6)
Hemoglobin: 14.1 g/dL (ref 11.1–15.9)
Immature Grans (Abs): 0.1 10*3/uL (ref 0.0–0.1)
Immature Granulocytes: 1 %
Lymphocytes Absolute: 2.8 10*3/uL (ref 0.7–3.1)
Lymphs: 30 %
MCH: 32.9 pg (ref 26.6–33.0)
MCHC: 33.6 g/dL (ref 31.5–35.7)
MCV: 98 fL — ABNORMAL HIGH (ref 79–97)
Monocytes Absolute: 0.6 10*3/uL (ref 0.1–0.9)
Monocytes: 7 %
Neutrophils Absolute: 5.6 10*3/uL (ref 1.4–7.0)
Neutrophils: 60 %
Platelets: 180 10*3/uL (ref 150–450)
RBC: 4.29 x10E6/uL (ref 3.77–5.28)
RDW: 12.6 % (ref 11.7–15.4)
WBC: 9.2 10*3/uL (ref 3.4–10.8)

## 2022-02-09 LAB — VITAMIN B6: Vitamin B6: 9.6 ug/L (ref 3.4–65.2)

## 2022-02-09 LAB — VITAMIN B3
Nicotinamide: 26.7 ng/mL (ref 5.2–72.1)
Nicotinic Acid: <5 ng/mL (ref 0.0–5.0)

## 2022-02-09 LAB — LIPID PANEL WITH LDL/HDL RATIO
Cholesterol, Total: 100 mg/dL (ref 100–199)
HDL: 38 mg/dL — ABNORMAL LOW (ref >39)
LDL Chol Calc (NIH): 43 mg/dL (ref 0–99)
LDL/HDL Ratio: 1.1 ratio (ref 0.0–3.2)
Triglycerides: 100 mg/dL (ref 0–149)
VLDL Cholesterol Cal: 19 mg/dL (ref 5–40)

## 2022-02-09 LAB — CMP14+EGFR
ALT: 22 IU/L (ref 0–32)
AST: 29 IU/L (ref 0–40)
Albumin/Globulin Ratio: 1.6 (ref 1.2–2.2)
Albumin: 4.1 g/dL (ref 3.8–4.9)
Alkaline Phosphatase: 64 IU/L (ref 44–121)
BUN/Creatinine Ratio: 22 (ref 9–23)
BUN: 14 mg/dL (ref 6–24)
Bilirubin Total: 0.9 mg/dL (ref 0.0–1.2)
CO2: 18 mmol/L — ABNORMAL LOW (ref 20–29)
Calcium: 9.1 mg/dL (ref 8.7–10.2)
Chloride: 104 mmol/L (ref 96–106)
Creatinine, Ser: 0.65 mg/dL (ref 0.57–1.00)
Globulin, Total: 2.5 g/dL (ref 1.5–4.5)
Glucose: 96 mg/dL (ref 70–99)
Potassium: 4.4 mmol/L (ref 3.5–5.2)
Sodium: 139 mmol/L (ref 134–144)
Total Protein: 6.6 g/dL (ref 6.0–8.5)
eGFR: 106 mL/min/{1.73_m2} (ref >59)

## 2022-02-09 LAB — IRON AND TIBC
Iron Saturation: 56 % — ABNORMAL HIGH (ref 15–55)
Iron: 172 ug/dL — ABNORMAL HIGH (ref 27–159)
Total Iron Binding Capacity: 309 ug/dL (ref 250–450)
UIBC: 137 ug/dL (ref 131–425)

## 2022-02-09 LAB — VITAMIN B12: Vitamin B-12: 685 pg/mL (ref 232–1245)

## 2022-02-09 LAB — FERRITIN: Ferritin: 239 ng/mL — ABNORMAL HIGH (ref 15–150)

## 2022-02-09 LAB — HEMOGLOBIN A1C
Est. average glucose Bld gHb Est-mCnc: 114 mg/dL
Hgb A1c MFr Bld: 5.6 % (ref 4.8–5.6)

## 2022-02-09 LAB — PHOSPHORUS: Phosphorus: 3.9 mg/dL (ref 3.0–4.3)

## 2022-02-09 LAB — MAGNESIUM: Magnesium: 1.9 mg/dL (ref 1.6–2.3)

## 2022-02-09 LAB — FOLATE: Folate: 18.5 ng/mL (ref >3.0)

## 2022-02-09 LAB — INSULIN, RANDOM: INSULIN: 22.3 u[IU]/mL (ref 2.6–24.9)

## 2022-02-09 LAB — TSH: TSH: 1.72 u[IU]/mL (ref 0.450–4.500)

## 2022-02-26 ENCOUNTER — Ambulatory Visit (INDEPENDENT_AMBULATORY_CARE_PROVIDER_SITE_OTHER): Payer: 59 | Admitting: Family Medicine

## 2022-02-26 ENCOUNTER — Encounter (INDEPENDENT_AMBULATORY_CARE_PROVIDER_SITE_OTHER): Payer: Self-pay | Admitting: Family Medicine

## 2022-02-26 VITALS — BP 159/86 | HR 86 | Temp 98.2°F | Ht 66.0 in | Wt 229.0 lb

## 2022-02-26 DIAGNOSIS — R7303 Prediabetes: Secondary | ICD-10-CM

## 2022-02-26 DIAGNOSIS — Z6837 Body mass index (BMI) 37.0-37.9, adult: Secondary | ICD-10-CM

## 2022-02-26 DIAGNOSIS — J301 Allergic rhinitis due to pollen: Secondary | ICD-10-CM

## 2022-02-26 DIAGNOSIS — E669 Obesity, unspecified: Secondary | ICD-10-CM

## 2022-02-26 DIAGNOSIS — Z6834 Body mass index (BMI) 34.0-34.9, adult: Secondary | ICD-10-CM | POA: Insufficient documentation

## 2022-02-26 MED ORDER — METFORMIN HCL 500 MG PO TABS: 500.0000 mg | ORAL_TABLET | Freq: Two times a day (BID) | ORAL | 0 refills | Status: DC

## 2022-03-12 NOTE — Progress Notes (Unsigned)
Chief Complaint:   OBESITY Ruth Elliott is here to discuss her progress with her obesity treatment plan along with follow-up of her obesity related diagnoses. Ruth Elliott is on the Category 3 Plan or keeping a food journal and adhering to recommended goals of 1500 calories and 100+ grams of protein and states she is following her eating plan approximately 70% of the time. Ruth Elliott states she is walking for 20-25 minutes 5 times per week.  Today's visit was #: 17 Starting weight: 223 lbs Starting date: 08/30/2019 Today's weight: 229 lbs Today's date: 02/26/2022 Total lbs lost to date: 0 Total lbs lost since last in-office visit: 3  Interim History: Ruth Elliott continues to work on her diet. She has struggled with meal planning in the last week. She is still mindful of her meals.   Subjective:   1. Pre-diabetes Ruth Elliott is taking metformin regularly, with no side effect noted. She notes decreased polyphagia. She is taking 2 pills daily.   2. Allergic rhinitis due to pollen, unspecified seasonality Ruth Elliott is stable on Zyrtec as needed. Symptoms mostly in the Fall.   Assessment/Plan:   1. Pre-diabetes We will refill metformin for 90 days. Cortney will continue to work on weight loss, exercise, and decreasing simple carbohydrates to help decrease the risk of diabetes.   - metFORMIN (GLUCOPHAGE) 500 MG tablet; Take 1 tablet (500 mg total) by mouth 2 (two) times daily with a meal.  Dispense: 180 tablet; Refill: 0  2. Allergic rhinitis due to pollen, unspecified seasonality Ruth Elliott will continue Zyrtec, and she is ok to take 2 tablets as needed.   3. BMI 37.0-37.9, adult  4. Obesity, Beginning BMI 35.99 Ruth Elliott is currently in the action stage of change. As such, her goal is to continue with weight loss efforts. She has agreed to the Category 3 Plan.   Exercise goals: As is.   Behavioral modification strategies: increasing lean protein intake.  Ruth Elliott has agreed to follow-up with our clinic in 4 weeks. She  was informed of the importance of frequent follow-up visits to maximize her success with intensive lifestyle modifications for her multiple health conditions.   Objective:   Blood pressure (!) 159/86, pulse 86, temperature 98.2 F (36.8 C), height '5\' 6"'$  (1.676 m), weight 229 lb (103.9 kg), SpO2 96 %. Body mass index is 36.96 kg/m.  General: Cooperative, alert, well developed, in no acute distress. HEENT: Conjunctivae and lids unremarkable. Cardiovascular: Regular rhythm.  Lungs: Normal work of breathing. Neurologic: No focal deficits.   Lab Results  Component Value Date   CREATININE 0.65 01/30/2022   BUN 14 01/30/2022   NA 139 01/30/2022   K 4.4 01/30/2022   CL 104 01/30/2022   CO2 18 (L) 01/30/2022   Lab Results  Component Value Date   ALT 22 01/30/2022   AST 29 01/30/2022   ALKPHOS 64 01/30/2022   BILITOT 0.9 01/30/2022   Lab Results  Component Value Date   HGBA1C 5.6 01/30/2022   HGBA1C 5.4 08/23/2021   HGBA1C 5.5 03/01/2021   HGBA1C 5.6 01/01/2021   HGBA1C 5.5 10/24/2020   Lab Results  Component Value Date   INSULIN 22.3 01/30/2022   INSULIN 12.9 08/23/2021   INSULIN 8.2 03/01/2021   INSULIN 13.4 01/01/2021   INSULIN 15.2 10/24/2020   Lab Results  Component Value Date   TSH 1.720 01/30/2022   Lab Results  Component Value Date   CHOL 100 01/30/2022   HDL 38 (L) 01/30/2022   LDLCALC 43 01/30/2022  TRIG 100 01/30/2022   CHOLHDL 2.5 08/09/2019   Lab Results  Component Value Date   VD25OH 51.7 08/23/2021   VD25OH 48.5 03/01/2021   VD25OH 48.8 01/01/2021   Lab Results  Component Value Date   WBC 9.2 01/30/2022   HGB 14.1 01/30/2022   HCT 42.0 01/30/2022   MCV 98 (H) 01/30/2022   PLT 180 01/30/2022   Lab Results  Component Value Date   IRON 172 (H) 01/30/2022   TIBC 309 01/30/2022   FERRITIN 239 (H) 01/30/2022   Attestation Statements:   Reviewed by clinician on day of visit: allergies, medications, problem list, medical history, surgical  history, family history, social history, and previous encounter notes.   I, Trixie Dredge, am acting as transcriptionist for Dennard Nip, MD.  I have reviewed the above documentation for accuracy and completeness, and I agree with the above. -  Dennard Nip, MD

## 2022-03-26 ENCOUNTER — Ambulatory Visit (INDEPENDENT_AMBULATORY_CARE_PROVIDER_SITE_OTHER): Payer: 59 | Admitting: Family Medicine

## 2022-03-26 ENCOUNTER — Encounter (INDEPENDENT_AMBULATORY_CARE_PROVIDER_SITE_OTHER): Payer: Self-pay | Admitting: Family Medicine

## 2022-03-26 VITALS — BP 144/82 | HR 69 | Temp 98.0°F | Ht 66.0 in | Wt 232.0 lb

## 2022-03-26 DIAGNOSIS — I10 Essential (primary) hypertension: Secondary | ICD-10-CM

## 2022-03-26 DIAGNOSIS — Z6837 Body mass index (BMI) 37.0-37.9, adult: Secondary | ICD-10-CM | POA: Diagnosis not present

## 2022-03-26 DIAGNOSIS — E669 Obesity, unspecified: Secondary | ICD-10-CM

## 2022-03-26 DIAGNOSIS — R7303 Prediabetes: Secondary | ICD-10-CM

## 2022-03-26 MED ORDER — METFORMIN HCL 500 MG PO TABS: 500.0000 mg | ORAL_TABLET | Freq: Two times a day (BID) | ORAL | 0 refills | Status: DC

## 2022-04-03 NOTE — Progress Notes (Unsigned)
Chief Complaint:   OBESITY Ruth Elliott is here to discuss her progress with her obesity treatment plan along with follow-up of her obesity related diagnoses. Ruth Elliott is on the Category 3 Plan and states she is following her eating plan approximately 60% of the time. Ruth Elliott states she is walking and using [employer] for 30 minutes 5 times per week.  Today's visit was #: 51 Starting weight: 223 lbs Starting date: 08/30/2019 Today's weight: 232 lbs Today's date: 03/26/2022 Total lbs lost to date: 0 Total lbs lost since last in-office visit: 0  Interim History: Ruth Elliott is struggling to eat all of the food on her plan, especially her protein which may be decreasing her RMR and contributing to her weight gain.  Subjective:   1. Pre-diabetes Ruth Elliott is struggling to decrease simple carbohydrates in her diet in the last month.  She has a history of polyphagia and she is on metformin for this.  2. White coat syndrome with hypertension Ruth Elliott's blood pressure is elevated today again.  She has a history of whitecoat syndrome.  She continues to work on her diet, exercise, and weight loss, but she has struggled more with this.  Assessment/Plan:   1. Pre-diabetes Ruth Elliott will continue metformin 500 mg twice daily, and we will refill for 90 days.  - metFORMIN (GLUCOPHAGE) 500 MG tablet; Take 1 tablet (500 mg total) by mouth 2 (two) times daily with a meal.  Dispense: 180 tablet; Refill: 0  2. White coat syndrome with hypertension Ruth Elliott is to check her blood pressure at home and bring in her log to her next visit. She will continue to work on weight loss, and continue with Norvasc and valsartan.  3. BMI 37.0-37.9, adult  4. Obesity, Beginning BMI 35.99 Ruth Elliott is currently in the action stage of change. As such, her goal is to continue with weight loss efforts. She has agreed to keeping a food journal and adhering to recommended goals of 1400-1600 calories and 100+ grams of protein daily.   Exercise  goals: As is.   Behavioral modification strategies: increasing lean protein intake and no skipping meals.  Ruth Elliott has agreed to follow-up with our clinic in 4 weeks. She was informed of the importance of frequent follow-up visits to maximize her success with intensive lifestyle modifications for her multiple health conditions.   Objective:   Blood pressure (!) 144/82, pulse 69, temperature 98 F (36.7 C), height 5\' 6"  (1.676 m), weight 232 lb (105.2 kg), SpO2 97 %. Body mass index is 37.45 kg/m.  Lab Results  Component Value Date   CREATININE 0.65 01/30/2022   BUN 14 01/30/2022   NA 139 01/30/2022   K 4.4 01/30/2022   CL 104 01/30/2022   CO2 18 (L) 01/30/2022   Lab Results  Component Value Date   ALT 22 01/30/2022   AST 29 01/30/2022   ALKPHOS 64 01/30/2022   BILITOT 0.9 01/30/2022   Lab Results  Component Value Date   HGBA1C 5.6 01/30/2022   HGBA1C 5.4 08/23/2021   HGBA1C 5.5 03/01/2021   HGBA1C 5.6 01/01/2021   HGBA1C 5.5 10/24/2020   Lab Results  Component Value Date   INSULIN 22.3 01/30/2022   INSULIN 12.9 08/23/2021   INSULIN 8.2 03/01/2021   INSULIN 13.4 01/01/2021   INSULIN 15.2 10/24/2020   Lab Results  Component Value Date   TSH 1.720 01/30/2022   Lab Results  Component Value Date   CHOL 100 01/30/2022   HDL 38 (L) 01/30/2022   [place]  43 01/30/2022   TRIG 100 01/30/2022   CHOLHDL 2.5 08/09/2019   Lab Results  Component Value Date   VD25OH 51.7 08/23/2021   VD25OH 48.5 03/01/2021   VD25OH 48.8 01/01/2021   Lab Results  Component Value Date   WBC 9.2 01/30/2022   HGB 14.1 01/30/2022   HCT 42.0 01/30/2022   MCV 98 (H) 01/30/2022   PLT 180 01/30/2022   Lab Results  Component Value Date   IRON 172 (H) 01/30/2022   TIBC 309 01/30/2022   FERRITIN 239 (H) 01/30/2022   Attestation Statements:   Reviewed by clinician on day of visit: allergies, medications, problem list, medical history, surgical history, family history, social history,  and previous encounter notes.   I, Trixie Dredge, am acting as transcriptionist for Dennard Nip, MD.  I have reviewed the above documentation for accuracy and completeness, and I agree with the above. -  Dennard Nip, MD

## 2022-04-23 ENCOUNTER — Ambulatory Visit (INDEPENDENT_AMBULATORY_CARE_PROVIDER_SITE_OTHER): Payer: 59 | Admitting: Family Medicine

## 2022-04-23 ENCOUNTER — Encounter (INDEPENDENT_AMBULATORY_CARE_PROVIDER_SITE_OTHER): Payer: Self-pay | Admitting: Family Medicine

## 2022-04-23 VITALS — BP 141/89 | HR 72 | Temp 97.8°F | Ht 66.0 in | Wt 233.0 lb

## 2022-04-23 DIAGNOSIS — E559 Vitamin D deficiency, unspecified: Secondary | ICD-10-CM | POA: Diagnosis not present

## 2022-04-23 DIAGNOSIS — Z6837 Body mass index (BMI) 37.0-37.9, adult: Secondary | ICD-10-CM | POA: Diagnosis not present

## 2022-04-23 DIAGNOSIS — E669 Obesity, unspecified: Secondary | ICD-10-CM | POA: Diagnosis not present

## 2022-04-23 DIAGNOSIS — R7303 Prediabetes: Secondary | ICD-10-CM

## 2022-04-24 NOTE — Progress Notes (Signed)
Chief Complaint:   OBESITY Ruth Elliott is here to discuss her progress with her obesity treatment plan along with follow-up of her obesity related diagnoses. Ruth Elliott is on keeping a food journal and adhering to recommended goals of 1400-1600 calories and 100+ grams of protein and states she is following her eating plan approximately 65% of the time. Ruth Elliott states she is walking for 30 minutes 5 times per week.  Today's visit was #: 40 Starting weight: 223 lbs Starting date: 08/30/2019 Today's weight: 233 lbs Today's date: 04/23/2022 Total lbs lost to date: 0 Total lbs lost since last in-office visit: 0  Interim History: Ruth Elliott is working on not missing meals.  She continues to try to increase her protein, but she still is not meeting her goal of 100 g of protein reliably.  Subjective:   1. Pre-diabetes Ruth Elliott is on metformin, and she is working on her diet.  No side effects were noted.  2. Vitamin D deficiency Ruth Elliott is on OTC vitamin D, and her recent level was at goal.  She has no signs of over replacement.  Assessment/Plan:   1. Pre-diabetes Ruth Elliott will continue metformin 500 mg twice daily, and we will recheck labs in 2 to 3 months.  2. Vitamin D deficiency Ruth Elliott will continue vitamin D OTC, and we will recheck labs in 2 to 3 months.  3. BMI 37.0-37.9, adult  4. Obesity, Beginning BMI 35.99 Ruth Elliott is currently in the action stage of change. As such, her goal is to continue with weight loss efforts. She has agreed to keeping a food journal and adhering to recommended goals of 1400-1600 calories and 100+ grams of  protein daily.   We discussed the low protein is likely decreasing her RMR and preventing weight loss.  She will continue to work on this.  Recipes were discussed today.  Exercise goals: As is.  Behavioral modification strategies: increasing lean protein intake and keeping a strict food journal.  Ruth Elliott has agreed to follow-up with our clinic in 4 weeks. She was informed  of the importance of frequent follow-up visits to maximize her success with intensive lifestyle modifications for her multiple health conditions.   Objective:   Blood pressure (!) 141/89, pulse 72, temperature 97.8 F (36.6 C), height 5\' 6"  (1.676 m), weight 233 lb (105.7 kg), SpO2 97 %. Body mass index is 37.61 kg/m.  Lab Results  Component Value Date   CREATININE 0.65 01/30/2022   BUN 14 01/30/2022   NA 139 01/30/2022   K 4.4 01/30/2022   CL 104 01/30/2022   CO2 18 (L) 01/30/2022   Lab Results  Component Value Date   ALT 22 01/30/2022   AST 29 01/30/2022   ALKPHOS 64 01/30/2022   BILITOT 0.9 01/30/2022   Lab Results  Component Value Date   HGBA1C 5.6 01/30/2022   HGBA1C 5.4 08/23/2021   HGBA1C 5.5 03/01/2021   HGBA1C 5.6 01/01/2021   HGBA1C 5.5 10/24/2020   Lab Results  Component Value Date   INSULIN 22.3 01/30/2022   INSULIN 12.9 08/23/2021   INSULIN 8.2 03/01/2021   INSULIN 13.4 01/01/2021   INSULIN 15.2 10/24/2020   Lab Results  Component Value Date   TSH 1.720 01/30/2022   Lab Results  Component Value Date   CHOL 100 01/30/2022   HDL 38 (L) 01/30/2022   LDLCALC 43 01/30/2022   TRIG 100 01/30/2022   CHOLHDL 2.5 08/09/2019   Lab Results  Component Value Date   VD25OH 51.7 08/23/2021  VD25OH 48.5 03/01/2021   VD25OH 48.8 01/01/2021   Lab Results  Component Value Date   WBC 9.2 01/30/2022   HGB 14.1 01/30/2022   HCT 42.0 01/30/2022   MCV 98 (H) 01/30/2022   PLT 180 01/30/2022   Lab Results  Component Value Date   IRON 172 (H) 01/30/2022   TIBC 309 01/30/2022   FERRITIN 239 (H) 01/30/2022   Attestation Statements:   Reviewed by clinician on day of visit: allergies, medications, problem list, medical history, surgical history, family history, social history, and previous encounter notes.  I have personally spent 45 minutes total time today in preparation, patient care, and documentation for this visit, including the following: review of  clinical lab tests; review of medical tests/procedures/services.  I, Burt Knack, am acting as transcriptionist for Quillian Quince, MD.  I have reviewed the above documentation for accuracy and completeness, and I agree with the above. -  Quillian Quince, MD

## 2022-05-22 ENCOUNTER — Ambulatory Visit: Payer: 59 | Admitting: Dermatology

## 2022-05-22 VITALS — BP 166/98

## 2022-05-22 DIAGNOSIS — L812 Freckles: Secondary | ICD-10-CM | POA: Diagnosis not present

## 2022-05-22 DIAGNOSIS — L821 Other seborrheic keratosis: Secondary | ICD-10-CM

## 2022-05-22 DIAGNOSIS — W908XXA Exposure to other nonionizing radiation, initial encounter: Secondary | ICD-10-CM

## 2022-05-22 DIAGNOSIS — X32XXXA Exposure to sunlight, initial encounter: Secondary | ICD-10-CM

## 2022-05-22 DIAGNOSIS — L814 Other melanin hyperpigmentation: Secondary | ICD-10-CM

## 2022-05-22 DIAGNOSIS — I872 Venous insufficiency (chronic) (peripheral): Secondary | ICD-10-CM | POA: Diagnosis not present

## 2022-05-22 DIAGNOSIS — L817 Pigmented purpuric dermatosis: Secondary | ICD-10-CM

## 2022-05-22 DIAGNOSIS — D229 Melanocytic nevi, unspecified: Secondary | ICD-10-CM

## 2022-05-22 DIAGNOSIS — Z1283 Encounter for screening for malignant neoplasm of skin: Secondary | ICD-10-CM

## 2022-05-22 DIAGNOSIS — L720 Epidermal cyst: Secondary | ICD-10-CM

## 2022-05-22 DIAGNOSIS — D1801 Hemangioma of skin and subcutaneous tissue: Secondary | ICD-10-CM

## 2022-05-22 DIAGNOSIS — L729 Follicular cyst of the skin and subcutaneous tissue, unspecified: Secondary | ICD-10-CM

## 2022-05-22 DIAGNOSIS — Z7189 Other specified counseling: Secondary | ICD-10-CM

## 2022-05-22 DIAGNOSIS — L578 Other skin changes due to chronic exposure to nonionizing radiation: Secondary | ICD-10-CM

## 2022-05-22 NOTE — Patient Instructions (Signed)
Due to recent changes in healthcare laws, you may see results of your pathology and/or laboratory studies on MyChart before the doctors have had a chance to review them. We understand that in some cases there may be results that are confusing or concerning to you. Please understand that not all results are received at the same time and often the doctors may need to interpret multiple results in order to provide you with the best plan of care or course of treatment. Therefore, we ask that you please give us 2 business days to thoroughly review all your results before contacting the office for clarification. Should we see a critical lab result, you will be contacted sooner.   If You Need Anything After Your Visit  If you have any questions or concerns for your doctor, please call our main line at [phone] and press option 4 to reach your doctor's medical assistant. If no one answers, please leave a voicemail as directed and we will return your call as soon as possible. Messages left after 4 pm will be answered the following business day.   You may also send us a message via MyChart. We typically respond to MyChart messages within 1-2 business days.  For prescription refills, please ask your pharmacy to contact our office. Our fax number is [phone].  If you have an urgent issue when the clinic is closed that cannot wait until the next business day, you can page your doctor at the number below.    Please note that while we do our best to be available for urgent issues outside of office hours, we are not available 24/7.   If you have an urgent issue and are unable to reach us, you may choose to seek medical care at your doctor's office, retail clinic, urgent care center, or emergency room.  If you have a medical emergency, please immediately call 911 or go to the emergency department.  Pager Numbers  - Dr. Kowalski: [phone]  - Dr. Moye: [phone]  - Dr. Stewart:  [phone]  In the event of inclement weather, please call our main line at [phone] for an update on the status of any delays or closures.  Dermatology Medication Tips: Please keep the boxes that topical medications come in in order to help keep track of the instructions about where and how to use these. Pharmacies typically print the medication instructions only on the boxes and not directly on the medication tubes.   If your medication is too expensive, please contact our office at [phone] option 4 or send us a message through MyChart.   We are unable to tell what your co-pay for medications will be in advance as this is different depending on your insurance coverage. However, we may be able to find a substitute medication at lower cost or fill out paperwork to get insurance to cover a needed medication.   If a prior authorization is required to get your medication covered by your insurance company, please allow us 1-2 business days to complete this process.  Drug prices often vary depending on where the prescription is filled and some pharmacies may offer cheaper prices.  The website www.goodrx.com contains coupons for medications through different pharmacies. The prices here do not account for what the cost may be with help from insurance (it may be cheaper with your insurance), but the website can give you the price if you did not use any insurance.  - You can print the associated coupon and take it with   your prescription to the pharmacy.  - You may also stop by our office during regular business hours and pick up a GoodRx coupon card.  - If you need your prescription sent electronically to a different pharmacy, notify our office through [facility] MyChart or by phone at [phone] option 4.     Si Usted Necesita Algo Despus de Su Visita  Tambin puede enviarnos un mensaje a travs de MyChart. Por lo general respondemos a los mensajes de MyChart en el transcurso de 1 a 2  das hbiles.  Para renovar recetas, por favor pida a su farmacia que se ponga en contacto con nuestra oficina. Nuestro nmero de fax es el [phone].  Si tiene un asunto urgente cuando la clnica est cerrada y que no puede esperar hasta el siguiente da hbil, puede llamar/localizar a su doctor(a) al nmero que aparece a continuacin.   Por favor, tenga en cuenta que aunque hacemos todo lo posible para estar disponibles para asuntos urgentes fuera del horario de oficina, no estamos disponibles las 24 horas del da, los 7 das de la semana.   Si tiene un problema urgente y no puede comunicarse con nosotros, puede optar por buscar atencin mdica  en el consultorio de su doctor(a), en una clnica privada, en un centro de atencin urgente o en una sala de emergencias.  Si tiene una emergencia mdica, por favor llame inmediatamente al 911 o vaya a la sala de emergencias.  Nmeros de bper  - Dr. Kowalski: [phone]  - Dra. Moye: [phone]  - Dra. Stewart: [phone]  En caso de inclemencias del tiempo, por favor llame a nuestra lnea principal al [phone] para una actualizacin sobre el estado de cualquier retraso o cierre.  Consejos para la medicacin en dermatologa: Por favor, guarde las cajas en las que vienen los medicamentos de uso tpico para ayudarle a seguir las instrucciones sobre dnde y cmo usarlos. Las farmacias generalmente imprimen las instrucciones del medicamento slo en las cajas y no directamente en los tubos del medicamento.   Si su medicamento es muy caro, por favor, pngase en contacto con nuestra oficina llamando al [phone] y presione la opcin 4 o envenos un mensaje a travs de MyChart.   No podemos decirle cul ser su copago por los medicamentos por adelantado ya que esto es diferente dependiendo de la cobertura de su seguro. Sin embargo, es posible que podamos encontrar un medicamento sustituto a menor costo o llenar un formulario para que el  seguro cubra el medicamento que se considera necesario.   Si se requiere una autorizacin previa para que su compaa de seguros cubra su medicamento, por favor permtanos de 1 a 2 das hbiles para completar este proceso.  Los precios de los medicamentos varan con frecuencia dependiendo del lugar de dnde se surte la receta y alguna farmacias pueden ofrecer precios ms baratos.  El sitio web www.goodrx.com tiene cupones para medicamentos de diferentes farmacias. Los precios aqu no tienen en cuenta lo que podra costar con la ayuda del seguro (puede ser ms barato con su seguro), pero el sitio web puede darle el precio si no utiliz ningn seguro.  - Puede imprimir el cupn correspondiente y llevarlo con su receta a la farmacia.  - Tambin puede pasar por nuestra oficina durante el horario de atencin regular y recoger una tarjeta de cupones de GoodRx.  - Si necesita que su receta se enve electrnicamente a una farmacia diferente, informe a nuestra oficina a travs de MyChart de [place]   o por telfono llamando al [phone] y presione la opcin 4.  

## 2022-05-22 NOTE — Progress Notes (Signed)
   Follow-Up Visit   Subjective  Ruth Elliott is a 52 y.o. female who presents for the following: Skin Cancer Screening and Full Body Skin Exam The patient presents for Total-Body Skin Exam (TBSE) for skin cancer screening and mole check. The patient has spots, moles and lesions to be evaluated, some may be new or changing and the patient has concerns that these could be cancer.  The following portions of the chart were reviewed this encounter and updated as appropriate: medications, allergies, medical history  Review of Systems:  No other skin or systemic complaints except as noted in HPI or Assessment and Plan.  Objective  Well appearing patient in no apparent distress; mood and affect are within normal limits.  A full examination was performed including scalp, head, eyes, ears, nose, lips, neck, chest, axillae, abdomen, back, buttocks, bilateral upper extremities, bilateral lower extremities, hands, feet, fingers, toes, fingernails, and toenails. All findings within normal limits unless otherwise noted below.   Relevant physical exam findings are noted in the Assessment and Plan.   Assessment & Plan   EPIDERMAL INCLUSION CYST Exam: Subcutaneous nodule at right inf scapula 0.6 cm Benign-appearing. Exam most consistent with an epidermal inclusion cyst. Discussed that a cyst is a benign growth that can grow over time and sometimes get irritated or inflamed. Recommend observation if it is not bothersome. Discussed option of surgical excision to remove it if it is growing, symptomatic, or other changes noted. Please call for new or changing lesions so they can be evaluated.  Axillary Freckling Exam: Speckled macules of right axilla. Treatment Plan: No personal or family history of neurofibromatosis. Recommend further evaluation by PCP if they feels it is necessary.  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  Recommend daily graduated compression hose/stockings- easiest to put on first thing in morning, remove at bedtime.  Elevate legs as much as possible. Avoid salt/sodium rich foods. Treatment Plan: As above  LENTIGINES, SEBORRHEIC KERATOSES, HEMANGIOMAS - Benign normal skin lesions - Benign-appearing - Call for any changes  MELANOCYTIC NEVI - Tan-brown and/or pink-flesh-colored symmetric macules and papules - Benign appearing on exam today - Observation - Call clinic for new or changing moles - Recommend daily use of broad spectrum spf 30+ sunscreen to sun-exposed areas.   ACTINIC DAMAGE - Chronic condition, secondary to cumulative UV/sun exposure - diffuse scaly erythematous macules with underlying dyspigmentation - Recommend daily broad spectrum sunscreen SPF 30+ to sun-exposed areas, reapply every 2 hours as needed.  - Staying in the shade or wearing long sleeves, sun glasses (UVA+UVB protection) and wide brim hats (4-inch brim around the entire circumference of the hat) are also recommended for sun protection.  - Call for new or changing lesions.  SKIN CANCER SCREENING PERFORMED TODAY.  Return in about 2 years (around 05/21/2024) for TBSE.  I, Joanie Coddington, CMA, am acting as scribe for Armida Sans, MD .  Documentation: I have reviewed the above documentation for accuracy and completeness, and I agree with the above.  Armida Sans, MD

## 2022-05-28 ENCOUNTER — Encounter: Payer: Self-pay | Admitting: Dermatology

## 2022-05-28 ENCOUNTER — Ambulatory Visit (INDEPENDENT_AMBULATORY_CARE_PROVIDER_SITE_OTHER): Payer: 59 | Admitting: Family Medicine

## 2022-05-28 ENCOUNTER — Encounter (INDEPENDENT_AMBULATORY_CARE_PROVIDER_SITE_OTHER): Payer: Self-pay | Admitting: Family Medicine

## 2022-05-28 VITALS — BP 154/89 | HR 72 | Temp 98.5°F | Ht 66.0 in | Wt 232.0 lb

## 2022-05-28 DIAGNOSIS — Z6837 Body mass index (BMI) 37.0-37.9, adult: Secondary | ICD-10-CM | POA: Diagnosis not present

## 2022-05-28 DIAGNOSIS — E669 Obesity, unspecified: Secondary | ICD-10-CM

## 2022-05-28 DIAGNOSIS — R7303 Prediabetes: Secondary | ICD-10-CM

## 2022-05-28 DIAGNOSIS — M7072 Other bursitis of hip, left hip: Secondary | ICD-10-CM | POA: Diagnosis not present

## 2022-05-28 DIAGNOSIS — M7070 Other bursitis of hip, unspecified hip: Secondary | ICD-10-CM | POA: Insufficient documentation

## 2022-05-28 MED ORDER — METFORMIN HCL 500 MG PO TABS: 500.0000 mg | ORAL_TABLET | Freq: Two times a day (BID) | ORAL | 0 refills | Status: DC

## 2022-05-29 NOTE — Progress Notes (Signed)
Chief Complaint:   OBESITY Ruth Elliott is here to discuss her progress with her obesity treatment plan along with follow-up of her obesity related diagnoses. Ruth Elliott is on keeping a food journal and adhering to recommended goals of 1400-1600 calories and 100+ grams of protein and states she is following her eating plan approximately 60% of the time. Ruth Elliott states she is walking for 30 minutes 6 times per week.  Today's visit was #: 41 Starting weight: 223 lbs Starting date: 08/30/2019 Today's weight: 232 lbs Today's date: 05/28/2022 Total lbs lost to date: 0 Total lbs lost since last in-office visit: 1  Interim History: Ruth Elliott continues to do well with weight loss.  She is doing better with not skipping meals.  She has increased walking with her horse more often.  Subjective:   1. Pre-diabetes Ruth Elliott is doing well on metformin and she has been able to remember to take both doses regularly.  2. Bursitis of other bursa of left hip Ruth Elliott notes increased left hip pain recently which may affect her ability and desire to exercise.  Assessment/Plan:   1. Pre-diabetes Ruth Elliott will continue metformin, and we will refill for 1 month.  - metFORMIN (GLUCOPHAGE) 500 MG tablet; Take 1 tablet (500 mg total) by mouth 2 (two) times daily with a meal.  Dispense: 60 tablet; Refill: 0  2. Bursitis of other bursa of left hip Ruth Elliott was referred to Dr. Denyse Amass for a more in-depth evaluation and treatment options.  3. BMI 37.0-37.9, adult  4. Obesity, Beginning BMI 35.99 Ruth Elliott is currently in the action stage of change. As such, her goal is to continue with weight loss efforts. She has agreed to keeping a food journal and adhering to recommended goals of 1400-1600 calories and 100+ grams of protein daily.   Exercise goals: As is.   Behavioral modification strategies: increasing lean protein intake.  Ruth Elliott has agreed to follow-up with our clinic in 4 weeks. She was informed of the importance of frequent  follow-up visits to maximize her success with intensive lifestyle modifications for her multiple health conditions.   Objective:   Blood pressure (!) 154/89, pulse 72, temperature 98.5 F (36.9 C), height 5\' 6"  (1.676 m), weight 232 lb (105.2 kg), SpO2 98 %. Body mass index is 37.45 kg/m.  Lab Results  Component Value Date   CREATININE 0.65 01/30/2022   BUN 14 01/30/2022   NA 139 01/30/2022   K 4.4 01/30/2022   CL 104 01/30/2022   CO2 18 (L) 01/30/2022   Lab Results  Component Value Date   ALT 22 01/30/2022   AST 29 01/30/2022   ALKPHOS 64 01/30/2022   BILITOT 0.9 01/30/2022   Lab Results  Component Value Date   HGBA1C 5.6 01/30/2022   HGBA1C 5.4 08/23/2021   HGBA1C 5.5 03/01/2021   HGBA1C 5.6 01/01/2021   HGBA1C 5.5 10/24/2020   Lab Results  Component Value Date   INSULIN 22.3 01/30/2022   INSULIN 12.9 08/23/2021   INSULIN 8.2 03/01/2021   INSULIN 13.4 01/01/2021   INSULIN 15.2 10/24/2020   Lab Results  Component Value Date   TSH 1.720 01/30/2022   Lab Results  Component Value Date   CHOL 100 01/30/2022   HDL 38 (L) 01/30/2022   LDLCALC 43 01/30/2022   TRIG 100 01/30/2022   CHOLHDL 2.5 08/09/2019   Lab Results  Component Value Date   VD25OH 51.7 08/23/2021   VD25OH 48.5 03/01/2021   VD25OH 48.8 01/01/2021   Lab Results  Component Value Date   WBC 9.2 01/30/2022   HGB 14.1 01/30/2022   HCT 42.0 01/30/2022   MCV 98 (H) 01/30/2022   PLT 180 01/30/2022   Lab Results  Component Value Date   IRON 172 (H) 01/30/2022   TIBC 309 01/30/2022   FERRITIN 239 (H) 01/30/2022   Attestation Statements:   Reviewed by clinician on day of visit: allergies, medications, problem list, medical history, surgical history, family history, social history, and previous encounter notes.   I, Burt Knack, am acting as transcriptionist for Quillian Quince, MD.  I have reviewed the above documentation for accuracy and completeness, and I agree with the above. -  Quillian Quince, MD

## 2022-06-06 ENCOUNTER — Ambulatory Visit: Payer: Managed Care, Other (non HMO) | Admitting: Family Medicine

## 2022-06-17 ENCOUNTER — Ambulatory Visit: Payer: 59 | Admitting: Family

## 2022-06-17 ENCOUNTER — Encounter: Payer: Self-pay | Admitting: Family

## 2022-06-17 VITALS — BP 136/84 | HR 77 | Temp 97.8°F | Ht 66.0 in | Wt 235.4 lb

## 2022-06-17 DIAGNOSIS — B009 Herpesviral infection, unspecified: Secondary | ICD-10-CM | POA: Diagnosis not present

## 2022-06-17 DIAGNOSIS — L237 Allergic contact dermatitis due to plants, except food: Secondary | ICD-10-CM | POA: Diagnosis not present

## 2022-06-17 MED ORDER — VALACYCLOVIR HCL 1 G PO TABS: ORAL_TABLET | ORAL | 0 refills | Status: DC

## 2022-06-17 MED ORDER — PREDNISONE 20 MG PO TABS: ORAL_TABLET | ORAL | 0 refills | Status: DC

## 2022-06-17 NOTE — Assessment & Plan Note (Signed)
Poison ivy vs herpatic lesion/shingles/herpatic whitlow Rx valtrex 1 g tid x 7 days  Rx prednisone pack Calamine lotion ok prn  Please monitor site for worsening signs/symptoms of infection to include: increasing redness, increasing tenderness, increase in size, and or pustulant drainage from site. If this is to occur please let me know immediately.

## 2022-06-17 NOTE — Patient Instructions (Signed)
  Take both the prednisone and also the valtrex as prescribed.    Regards,   Mort Sawyers FNP-C

## 2022-06-17 NOTE — Progress Notes (Signed)
Established Patient Office Visit  Subjective:   Patient ID: Ruth Elliott, female    DOB: [birth year]  Age: 52 y.o. MRN: [id number]  CC:  Chief Complaint  Patient presents with   Blister    Left two fingers has blisters from possibly poison oak.    HPI: Ruth Elliott is a 52 y.o. female presenting on 06/17/2022 for Blister (Left two fingers has blisters from possibly poison oak.)   HPI  Five days ago noticed she had a blister on right hand her 2nd and third fingers. She has been riding horses which she went riding over the weekend, and she think she might have poison ivy or sumac, she is unsure if this is the case.   It is really itchy as well and states that someone else at the farm has poison sumac at this time, she also think she has gotten it in her gloves too.       ROS: Negative unless specifically indicated above in HPI.   Relevant past medical history reviewed and updated as indicated.   Allergies and medications reviewed and updated.   Current Outpatient Medications:    amLODipine (NORVASC) 5 MG tablet, TAKE 1 TABLET(5 MG) BY MOUTH DAILY, Disp: 90 tablet, Rfl: 3   B Complex-C-Folic Acid (HM SUPER VITAMIN B COMPLEX/C PO), Take by mouth., Disp: , Rfl:    cetirizine (ZYRTEC) 10 MG chewable tablet, Chew 10 mg by mouth daily., Disp: , Rfl:    cholecalciferol (VITAMIN D3) 25 MCG (1000 UNIT) tablet, Take 1,000 Units by mouth daily., Disp: , Rfl:    COLLAGEN PO, Take 1 Scoop by mouth daily. Collagen Peptide in coffee in am, Disp: , Rfl:    Flax Oil-Fish Oil-Borage Oil (FISH OIL-FLAX OIL-BORAGE OIL) CAPS, Take 2 capsules by mouth daily. , Disp: , Rfl:    fluticasone (FLONASE) 50 MCG/ACT nasal spray, SHAKE LIQUID AND USE 2 SPRAYS IN EACH NOSTRIL DAILY, Disp: 16 g, Rfl: 1   ibuprofen (ADVIL) 400 MG tablet, Take 400 mg by mouth every 6 (six) hours as needed., Disp: , Rfl:    metFORMIN (GLUCOPHAGE) 500 MG tablet, Take 1 tablet (500 mg total) by mouth 2 (two) times daily  with a meal., Disp: 60 tablet, Rfl: 0   Multiple Vitamin (MULTIVITAMIN) capsule, Take 1 capsule by mouth daily., Disp: , Rfl:    norethindrone-ethinyl estradiol (MICROGESTIN,JUNEL,LOESTRIN) 1-20 MG-MCG tablet, Take 1 tablet by mouth daily., Disp: , Rfl: 0   predniSONE (DELTASONE) 20 MG tablet, Take two tablets po qd for five days, one tablet po qd for five days, then 1/2 tablet po qd for five days, Disp: 18 tablet, Rfl: 0   valACYclovir (VALTREX) 1000 MG tablet, Take one po tid x 7 days, Disp: 21 tablet, Rfl: 0   valsartan (DIOVAN) 80 MG tablet, Take 1 tablet (80 mg total) by mouth daily., Disp: 90 tablet, Rfl: 3  Allergies  Allergen Reactions   Penicillins Shortness Of Breath    Did it involve swelling of the face/tongue/throat, SOB, or low BP? Yes Did it involve sudden or severe rash/hives, skin peeling, or any reaction on the inside of your mouth or nose? No Did you need to seek medical attention at a hospital or doctor's office? Yes When did it last happen?   childhood    If all above answers are "NO", may proceed with cephalosporin use.    Hydralazine     Elevated blood pressure, migraine, and lethary   Lisinopril  paresthesia   Singulair [Montelukast Sodium]     Moody    Objective:   BP 136/84 (BP Location: Right Arm)   Pulse 77   Temp 97.8 F (36.6 C) (Temporal)   Ht 5\' 6"  (1.676 m)   Wt 235 lb 6.4 oz (106.8 kg)   SpO2 98%   BMI 37.99 kg/m    Physical Exam Vitals reviewed.  Constitutional:      General: She is not in acute distress.    Appearance: Normal appearance. She is normal weight. She is not ill-appearing, toxic-appearing or diaphoretic.  HENT:     Head: Normocephalic.  Cardiovascular:     Rate and Rhythm: Normal rate.  Pulmonary:     Effort: Pulmonary effort is normal.  Musculoskeletal:        General: Normal range of motion.  Skin:    Comments: Left 2nd metatarsal with multiple vesicular lesions as well as redness and also on the third right sided  lateral aspect with same presentation   Neurological:     General: No focal deficit present.     Mental Status: She is alert and oriented to person, place, and time. Mental status is at baseline.  Psychiatric:        Mood and Affect: Mood normal.        Behavior: Behavior normal.        Thought Content: Thought content normal.        Judgment: Judgment normal.       . Assessment & Plan:  Herpetic lesion -     valACYclovir HCl; Take one po tid x 7 days  Dispense: 21 tablet; Refill: 0  Allergic contact dermatitis due to plants, except food Assessment & Plan: Poison ivy vs herpatic lesion/shingles/herpatic whitlow Rx valtrex 1 g tid x 7 days  Rx prednisone pack Calamine lotion ok prn  Please monitor site for worsening signs/symptoms of infection to include: increasing redness, increasing tenderness, increase in size, and or pustulant drainage from site. If this is to occur please let me know immediately.     Orders: -     predniSONE; Take two tablets po qd for five days, one tablet po qd for five days, then 1/2 tablet po qd for five days  Dispense: 18 tablet; Refill: 0     Follow up plan: Return if symptoms worsen or fail to improve.  Mort Sawyers, FNP

## 2022-06-26 ENCOUNTER — Encounter (INDEPENDENT_AMBULATORY_CARE_PROVIDER_SITE_OTHER): Payer: Self-pay | Admitting: Family Medicine

## 2022-06-26 ENCOUNTER — Ambulatory Visit (INDEPENDENT_AMBULATORY_CARE_PROVIDER_SITE_OTHER): Payer: 59 | Admitting: Family Medicine

## 2022-06-26 VITALS — BP 130/83 | HR 75 | Temp 97.8°F | Ht 66.0 in | Wt 225.0 lb

## 2022-06-26 DIAGNOSIS — E669 Obesity, unspecified: Secondary | ICD-10-CM

## 2022-06-26 DIAGNOSIS — R7303 Prediabetes: Secondary | ICD-10-CM

## 2022-06-26 DIAGNOSIS — I10 Essential (primary) hypertension: Secondary | ICD-10-CM

## 2022-06-26 DIAGNOSIS — Z6836 Body mass index (BMI) 36.0-36.9, adult: Secondary | ICD-10-CM

## 2022-06-26 MED ORDER — METFORMIN HCL 500 MG PO TABS: 500.0000 mg | ORAL_TABLET | Freq: Two times a day (BID) | ORAL | 0 refills | Status: DC

## 2022-06-27 NOTE — Progress Notes (Signed)
.smr  Office: [phone]  /  Fax: [phone]  WEIGHT SUMMARY AND BIOMETRICS  Anthropometric Measurements Height: 5\' 6"  (1.676 m) Weight: 225 lb (102.1 kg) BMI (Calculated): 36.33 Weight at Last Visit: 232 lb Weight Lost Since Last Visit: 7 lb Weight Gained Since Last Visit: 0   Body Composition  Body Fat %: 43.3 % Fat Mass (lbs): 97.6 lbs Muscle Mass (lbs): 121.6 lbs Total Body Water (lbs): 89.6 lbs Visceral Fat Rating : 12   Other Clinical Data Fasting: No Labs: No Today's Visit #: 72    Chief Complaint: OBESITY  Ruth Elliott is here to discuss her progress with her obesity treatment plan. She is on the the Category 3 Plan and states she is following her eating plan approximately 85 % of the time. She states she is exercising 35 minutes 5 times per week.  Discussed the use of AI scribe software for clinical note transcription with the patient, who gave verbal consent to proceed.  History of Present Illness   The patient, with a history of obesity, has been actively working on weight loss and has successfully lost seven pounds. They attribute this success to a new breakfast routine, which is high in protein and keeps them satiated throughout the day. The breakfast consists of ground [place], corn, black beans, chickpeas, and occasionally salsa, tomatoes, cucumbers, and feta cheese.  The patient also reports a recent bout of poison ivy, for which they were prescribed prednisone. During the course of the prednisone treatment, they noticed a significant reduction in hip pain, which they initially attributed to a new car. However, they later realized the pain relief was likely due to the prednisone.  The patient has also been experiencing episodes of lightheadedness and dizziness, which they believe may be related to hydration levels. They have a home blood pressure monitor and have been advised to monitor their blood pressure, particularly if the lightheadedness and dizziness  persist.  The patient is also involved in physical activities, including horse riding, which they enjoy. They have been tracking their calorie intake and exercise, but do not count the calories burned from exercise in their total daily intake. They plan to continue with their current diet and exercise regimen.          PHYSICAL EXAM:  Blood pressure 130/83, pulse 75, temperature 97.8 F (36.6 C), height 5\' 6"  (1.676 m), weight 225 lb (102.1 kg), SpO2 97 %. Body mass index is 36.32 kg/m.  DIAGNOSTIC DATA REVIEWED:  BMET    Component Value Date/Time   NA 139 01/30/2022 0849   K 4.4 01/30/2022 0849   CL 104 01/30/2022 0849   CO2 18 (L) 01/30/2022 0849   GLUCOSE 96 01/30/2022 0849   BUN 14 01/30/2022 0849   CREATININE 0.65 01/30/2022 0849   CALCIUM 9.1 01/30/2022 0849   GFRNONAA 105 02/14/2020 1204   GFRAA 121 02/14/2020 1204   Lab Results  Component Value Date   HGBA1C 5.6 01/30/2022   HGBA1C 5.4 08/31/2013   Lab Results  Component Value Date   INSULIN 22.3 01/30/2022   INSULIN 12.7 08/30/2019   Lab Results  Component Value Date   TSH 1.720 01/30/2022   CBC    Component Value Date/Time   WBC 9.2 01/30/2022 0849   RBC 4.29 01/30/2022 0849   HGB 14.1 01/30/2022 0849   HCT 42.0 01/30/2022 0849   PLT 180 01/30/2022 0849   MCV 98 (H) 01/30/2022 0849   MCH 32.9 01/30/2022 0849   MCHC 33.6 01/30/2022 0849  RDW 12.6 01/30/2022 0849   Iron Studies    Component Value Date/Time   IRON 172 (H) 01/30/2022 0849   TIBC 309 01/30/2022 0849   FERRITIN 239 (H) 01/30/2022 0849   IRONPCTSAT 56 (H) 01/30/2022 0849   Lipid Panel     Component Value Date/Time   CHOL 100 01/30/2022 0849   TRIG 100 01/30/2022 0849   HDL 38 (L) 01/30/2022 0849   CHOLHDL 2.5 08/09/2019 1047   LDLCALC 43 01/30/2022 0849   Hepatic Function Panel     Component Value Date/Time   PROT 6.6 01/30/2022 0849   ALBUMIN 4.1 01/30/2022 0849   AST 29 01/30/2022 0849   ALT 22 01/30/2022 0849    ALKPHOS 64 01/30/2022 0849   BILITOT 0.9 01/30/2022 0849   BILIDIR 0.13 09/13/2019 1117      Component Value Date/Time   TSH 1.720 01/30/2022 0849   Nutritional Lab Results  Component Value Date   VD25OH 51.7 08/23/2021   VD25OH 48.5 03/01/2021   VD25OH 48.8 01/01/2021     Assessment and Plan    Weight Management: Successful weight loss of 7 pounds through dietary changes, primarily increased protein and vegetable intake. Noted increased muscle mass and decreased fat mass. -Continue current dietary regimen. -Continue tracking caloric intake and protein consumption.  Hypertension: Blood pressure well-controlled on [place], with a reading of 130/80 in the office. Reports of occasional lightheadedness at home. -Ensure adequate hydration. -Check blood pressure at home and report if consistently below 100 systolic.  General Health Maintenance: -Schedule annual health screening with fasting labs in 4 weeks.           She was informed of the importance of frequent follow up visits to maximize her success with intensive lifestyle modifications for her multiple health conditions. Return in about 4 weeks (around 07/24/2022).   Quillian Quince, MD

## 2022-07-01 ENCOUNTER — Encounter: Payer: Self-pay | Admitting: Family

## 2022-07-01 ENCOUNTER — Ambulatory Visit: Payer: 59 | Admitting: Family

## 2022-07-01 VITALS — BP 134/82 | HR 75 | Temp 97.6°F | Ht 66.0 in | Wt 228.0 lb

## 2022-07-01 DIAGNOSIS — R7303 Prediabetes: Secondary | ICD-10-CM

## 2022-07-01 DIAGNOSIS — R252 Cramp and spasm: Secondary | ICD-10-CM | POA: Diagnosis not present

## 2022-07-01 DIAGNOSIS — I10 Essential (primary) hypertension: Secondary | ICD-10-CM | POA: Diagnosis not present

## 2022-07-01 NOTE — Addendum Note (Signed)
Addended by: Alvina Chou on: 07/01/2022 07:56 AM   Modules accepted: Orders

## 2022-07-01 NOTE — Assessment & Plan Note (Signed)
Checking potassium and magnesium levels.  Ordering ck as well however was short term episode which is reassuring.  Pt to continue with daily water intake of 30 ml once daily.

## 2022-07-01 NOTE — Progress Notes (Signed)
Established Patient Office Visit  Subjective:   Patient ID: Ruth Elliott, female    DOB: 08/20/1970  Age: 52 y.o. MRN: [id number]  CC:  Chief Complaint  Patient presents with   Leg Pain    Cramps x 1 week at night     HPI: Ruth Elliott is a 52 y.o. female presenting on 07/01/2022 for Leg Pain (Cramps x 1 week at night )  One week ago started with leg cramps, mainly last week.  Typically occurred at night and then progressed.  She felt bil lower leg cramps from the waist down.  She does state this happened two nights in a row and not since. She does state the day it first occurred she was warm and dehydrated.             ROS: Negative unless specifically indicated above in HPI.   Relevant past medical history reviewed and updated as indicated.   Allergies and medications reviewed and updated.   Current Outpatient Medications:    amLODipine (NORVASC) 5 MG tablet, TAKE 1 TABLET(5 MG) BY MOUTH DAILY, Disp: 90 tablet, Rfl: 3   B Complex-C-Folic Acid (HM SUPER VITAMIN B COMPLEX/C PO), Take by mouth., Disp: , Rfl:    cetirizine (ZYRTEC) 10 MG chewable tablet, Chew 10 mg by mouth daily., Disp: , Rfl:    cholecalciferol (VITAMIN D3) 25 MCG (1000 UNIT) tablet, Take 1,000 Units by mouth daily., Disp: , Rfl:    Flax Oil-Fish Oil-Borage Oil (FISH OIL-FLAX OIL-BORAGE OIL) CAPS, Take 2 capsules by mouth daily. , Disp: , Rfl:    fluticasone (FLONASE) 50 MCG/ACT nasal spray, SHAKE LIQUID AND USE 2 SPRAYS IN EACH NOSTRIL DAILY, Disp: 16 g, Rfl: 1   ibuprofen (ADVIL) 400 MG tablet, Take 400 mg by mouth every 6 (six) hours as needed., Disp: , Rfl:    metFORMIN (GLUCOPHAGE) 500 MG tablet, Take 1 tablet (500 mg total) by mouth 2 (two) times daily with a meal., Disp: 60 tablet, Rfl: 0   Multiple Vitamin (MULTIVITAMIN) capsule, Take 1 capsule by mouth daily., Disp: , Rfl:    predniSONE (DELTASONE) 20 MG tablet, Take two tablets po qd for five days, one tablet po qd for five days,  then 1/2 tablet po qd for five days, Disp: 18 tablet, Rfl: 0   valsartan (DIOVAN) 80 MG tablet, Take 1 tablet (80 mg total) by mouth daily., Disp: 90 tablet, Rfl: 3   COLLAGEN PO, Take 1 Scoop by mouth daily. Collagen Peptide in coffee in am (Patient not taking: Reported on 07/01/2022), Disp: , Rfl:    norethindrone-ethinyl estradiol (MICROGESTIN,JUNEL,LOESTRIN) 1-20 MG-MCG tablet, Take 1 tablet by mouth daily. (Patient not taking: Reported on 07/01/2022), Disp: , Rfl: 0   valACYclovir (VALTREX) 1000 MG tablet, Take one po tid x 7 days (Patient not taking: Reported on 07/01/2022), Disp: 21 tablet, Rfl: 0  Allergies  Allergen Reactions   Penicillins Shortness Of Breath    Did it involve swelling of the face/tongue/throat, SOB, or low BP? Yes Did it involve sudden or severe rash/hives, skin peeling, or any reaction on the inside of your mouth or nose? No Did you need to seek medical attention at a hospital or doctor's office? Yes When did it last happen?   childhood    If all above answers are "NO", may proceed with cephalosporin use.    Hydralazine     Elevated blood pressure, migraine, and lethary   Lisinopril     paresthesia  Singulair [Montelukast Sodium]     Moody    Objective:   BP 134/82   Pulse 75   Temp 97.6 F (36.4 C) (Temporal)   Ht 5\' 6"  (1.676 m)   Wt 228 lb (103.4 kg)   SpO2 94%   BMI 36.80 kg/m    Physical Exam Constitutional:      General: She is not in acute distress.    Appearance: Normal appearance. She is normal weight. She is not ill-appearing, toxic-appearing or diaphoretic.  HENT:     Head: Normocephalic.  Cardiovascular:     Rate and Rhythm: Normal rate.     Pulses:          Dorsalis pedis pulses are 2+ on the right side and 2+ on the left side.       Posterior tibial pulses are 2+ on the right side and 2+ on the left side.  Pulmonary:     Effort: Pulmonary effort is normal.  Musculoskeletal:        General: Normal range of motion.  Neurological:      General: No focal deficit present.     Mental Status: She is alert and oriented to person, place, and time. Mental status is at baseline.  Psychiatric:        Mood and Affect: Mood normal.        Behavior: Behavior normal.        Thought Content: Thought content normal.        Judgment: Judgment normal.     Assessment & Plan:  Muscle cramps Assessment & Plan: Checking potassium and magnesium levels.  Ordering ck as well however was short term episode which is reassuring.  Pt to continue with daily water intake of 30 ml once daily.   Orders: -     CK -     Magnesium -     Basic metabolic panel  Essential hypertension -     Basic metabolic panel  Pre-diabetes -     Basic metabolic panel     Follow up plan: Return if symptoms worsen or fail to improve.  Mort Sawyers, FNP

## 2022-07-02 LAB — BASIC METABOLIC PANEL
BUN/Creatinine Ratio: 21 (ref 9–23)
BUN: 15 mg/dL (ref 6–24)
CO2: 24 mmol/L (ref 20–29)
Calcium: 9.9 mg/dL (ref 8.7–10.2)
Chloride: 103 mmol/L (ref 96–106)
Creatinine, Ser: 0.7 mg/dL (ref 0.57–1.00)
Glucose: 83 mg/dL (ref 70–99)
Potassium: 4.6 mmol/L (ref 3.5–5.2)
Sodium: 139 mmol/L (ref 134–144)
eGFR: 104 mL/min/{1.73_m2} (ref >59)

## 2022-07-02 LAB — CK: Total CK: 176 U/L (ref 32–182)

## 2022-07-02 LAB — MAGNESIUM: Magnesium: 2.1 mg/dL (ref 1.6–2.3)

## 2022-07-25 ENCOUNTER — Ambulatory Visit (INDEPENDENT_AMBULATORY_CARE_PROVIDER_SITE_OTHER): Payer: 59

## 2022-07-25 ENCOUNTER — Ambulatory Visit: Payer: 59 | Admitting: Family Medicine

## 2022-07-25 VITALS — BP 168/90 | HR 76 | Ht 66.0 in | Wt 232.0 lb

## 2022-07-25 DIAGNOSIS — G8929 Other chronic pain: Secondary | ICD-10-CM

## 2022-07-25 DIAGNOSIS — M25552 Pain in left hip: Secondary | ICD-10-CM

## 2022-07-25 NOTE — Patient Instructions (Signed)
Thank you for coming in today.   Please get an Xray today before you leave   I've referred you to Physical Therapy.  Let us know if you don't hear from them in one week.   Recheck in 8 weeks.    

## 2022-07-25 NOTE — Progress Notes (Signed)
Rubin Payor, PhD, LAT, ATC acting as a scribe for Clementeen Graham, MD.  Ruth Elliott is a 52 y.o. female who presents to [facility] at [facility] today for L hip pain. Pt was previously seen by Dr. Denyse Amass on 07/09/21 for chronic R knee pain.   Today, pt c/o L hip pain ongoing since the end of March-early April. No MOI. She has ergonomic changes w/ her desk at work and also started driving a new car. Pt locates pain to into the L buttock and lateral aspect of her L hip.   Radiates: no Aggravates:figure-4 w/ forward trunk flexion, transitioning to stand Treatments tried: chiro adjustment, dry needling, heat, IBU, Tylenol  Pertinent review of systems: No fevers or chills  Relevant historical information: Hypertension.   Exam:  BP (!) 168/90   Pulse 76   Ht 5\' 6"  (1.676 m)   Wt 232 lb (105.2 kg)   SpO2 98%   BMI 37.45 kg/m  General: Well Developed, well nourished, and in no acute distress.   MSK: Left hip: Normal-appearing Normal motion. Mildly tender palpation lateral hip. Intact strength.     Lab and Radiology Results  X-ray images lumbar spine and left hip obtained today personally and independently interpreted.  Lumbar spine: DDD L5-S1.  No acute fractures are visible.  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  Await formal radiology review     Assessment and Plan: 52 y.o. 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  This could be referred pain from hip arthritis.  Could be lumbar radiculopathy.  She is a great candidate for trial of physical therapy.  Plan refer to trial of PT check back in 8 weeks.  If not better consider MRI or injection.   PDMP not reviewed this encounter. Orders Placed This Encounter  Procedures   DG HIP UNILAT W OR W/O PELVIS 2-3 VIEWS LEFT    Standing Status:   Future    Number of Occurrences:   1     Standing Expiration Date:   08/25/2022    Order Specific Question:   Reason for Exam (SYMPTOM  OR DIAGNOSIS REQUIRED)    Answer:   left hip pain    Order Specific Question:   Preferred imaging location?    Answer:   [facility]    Order Specific Question:   Is patient pregnant?    Answer:   No   DG Lumbar Spine 2-3 Views    Standing Status:   Future    Number of Occurrences:   1    Standing Expiration Date:   08/25/2022    Order Specific Question:   Reason for Exam (SYMPTOM  OR DIAGNOSIS REQUIRED)    Answer:   lwo back pain    Order Specific Question:   Preferred imaging location?    Answer:   [facility]    Order Specific Question:   Is patient pregnant?    Answer:   No   Ambulatory referral to Physical Therapy    Referral Priority:   Routine    Referral Type:   Physical Medicine    Referral Reason:   Specialty Services Required    Requested Specialty:   Physical Therapy    Number of Visits Requested:   1   No orders of the defined types were placed in this encounter.    Discussed warning  signs or symptoms. Please see discharge instructions. Patient expresses understanding.   The above documentation has been reviewed and is accurate and complete Clementeen Graham, M.D.

## 2022-07-31 ENCOUNTER — Ambulatory Visit (INDEPENDENT_AMBULATORY_CARE_PROVIDER_SITE_OTHER): Payer: 59 | Admitting: Family Medicine

## 2022-07-31 ENCOUNTER — Encounter (INDEPENDENT_AMBULATORY_CARE_PROVIDER_SITE_OTHER): Payer: Self-pay | Admitting: Family Medicine

## 2022-07-31 VITALS — BP 113/78 | HR 67 | Temp 98.2°F | Ht 66.0 in | Wt 226.0 lb

## 2022-07-31 DIAGNOSIS — E559 Vitamin D deficiency, unspecified: Secondary | ICD-10-CM | POA: Diagnosis not present

## 2022-07-31 DIAGNOSIS — Z6836 Body mass index (BMI) 36.0-36.9, adult: Secondary | ICD-10-CM

## 2022-07-31 DIAGNOSIS — R7303 Prediabetes: Secondary | ICD-10-CM | POA: Diagnosis not present

## 2022-07-31 DIAGNOSIS — E669 Obesity, unspecified: Secondary | ICD-10-CM

## 2022-07-31 MED ORDER — METFORMIN HCL 500 MG PO TABS
500.0000 mg | ORAL_TABLET | Freq: Two times a day (BID) | ORAL | 0 refills | Status: DC
Start: 2022-07-31 — End: 2022-09-02

## 2022-07-31 NOTE — Progress Notes (Signed)
.smr  Office: [phone]  /  Fax: [phone]  WEIGHT SUMMARY AND BIOMETRICS  Anthropometric Measurements Height: 5\' 6"  (1.676 m) Weight: 226 lb (102.5 kg) BMI (Calculated): 36.49 Weight at Last Visit: 225 lb Weight Lost Since Last Visit: 0 Weight Gained Since Last Visit: 1 lb Starting Weight: 223 lb Total Weight Loss (lbs): 0 lb (0 kg)   Body Composition  Body Fat %: 44.8 % Fat Mass (lbs): 101.6 lbs Muscle Mass (lbs): 118.8 lbs Total Body Water (lbs): 92.2 lbs Visceral Fat Rating : 12   Other Clinical Data Fasting: Yes Labs: Yes Today's Visit #: 80 Starting Date: 08/30/19    Chief Complaint: OBESITY     History of Present Illness   The patient is a 52 year old individual with a history of obesity and prediabetes, who presents for a routine follow-up. Over the past month, she has gained one pound despite attempts to adhere to a category three eating plan and regular exercise, which includes walking for twenty minutes four times per day. She reports challenges with dietary protein intake, particularly with [place], which she recently found unpalatable due to an unusual smell. She also expresses difficulty with grocery shopping and meal preparation due to time constraints from work.  The patient's work schedule has been particularly demanding recently, with long hours and multiple meetings. Despite this, she denies stress eating due to lack of time. She also reports issues with allergies, requiring her to take two doses of generic Zyrtec daily during severe episodes.  Regarding her prediabetes, the patient confirms adherence to her Metformin regimen, taken twice daily. She also takes over-the-counter Vitamin D supplements daily. She recently had a consultation for lower back pain, which was attributed to weakened lower back muscles rather than arthritis or bursitis. The patient is proactive in managing her health and is generally self-sufficient.          PHYSICAL  EXAM:  Blood pressure 113/78, pulse 67, temperature 98.2 F (36.8 C), height 5\' 6"  (1.676 m), weight 226 lb (102.5 kg), SpO2 95%. Body mass index is 36.48 kg/m.  DIAGNOSTIC DATA REVIEWED:  BMET    Component Value Date/Time   NA 139 07/01/2022 0756   K 4.6 07/01/2022 0756   CL 103 07/01/2022 0756   CO2 24 07/01/2022 0756   GLUCOSE 83 07/01/2022 0756   BUN 15 07/01/2022 0756   CREATININE 0.70 07/01/2022 0756   CALCIUM 9.9 07/01/2022 0756   GFRNONAA 105 02/14/2020 1204   GFRAA 121 02/14/2020 1204   Lab Results  Component Value Date   HGBA1C 5.6 01/30/2022   HGBA1C 5.4 08/31/2013   Lab Results  Component Value Date   INSULIN 22.3 01/30/2022   INSULIN 12.7 08/30/2019   Lab Results  Component Value Date   TSH 1.720 01/30/2022   CBC    Component Value Date/Time   WBC 9.2 01/30/2022 0849   RBC 4.29 01/30/2022 0849   HGB 14.1 01/30/2022 0849   HCT 42.0 01/30/2022 0849   PLT 180 01/30/2022 0849   MCV 98 (H) 01/30/2022 0849   MCH 32.9 01/30/2022 0849   MCHC 33.6 01/30/2022 0849   RDW 12.6 01/30/2022 0849   Iron Studies    Component Value Date/Time   IRON 172 (H) 01/30/2022 0849   TIBC 309 01/30/2022 0849   FERRITIN 239 (H) 01/30/2022 0849   IRONPCTSAT 56 (H) 01/30/2022 0849   Lipid Panel     Component Value Date/Time   CHOL 100 01/30/2022 0849   TRIG 100 01/30/2022 0849  HDL 38 (L) 01/30/2022 0849   CHOLHDL 2.5 08/09/2019 1047   LDLCALC 43 01/30/2022 0849   Hepatic Function Panel     Component Value Date/Time   PROT 6.6 01/30/2022 0849   ALBUMIN 4.1 01/30/2022 0849   AST 29 01/30/2022 0849   ALT 22 01/30/2022 0849   ALKPHOS 64 01/30/2022 0849   BILITOT 0.9 01/30/2022 0849   BILIDIR 0.13 09/13/2019 1117      Component Value Date/Time   TSH 1.720 01/30/2022 0849   Nutritional Lab Results  Component Value Date   VD25OH 51.7 08/23/2021   VD25OH 48.5 03/01/2021   VD25OH 48.8 01/01/2021     Assessment and Plan    Obesity: Weight increased  by one pound over the last month. Adherence to category three eating plan is approximately fifty percent. Regular exercise with walking for twenty minutes four times per day. Challenges with grocery shopping and meal prepping identified as barriers to dietary adherence. -Continue category three eating plan and regular exercise. -Work on improving grocery shopping and meal prepping habits.  Prediabetes: On Metformin twice daily. -Continue Metformin twice daily. -Check kidney and liver function, and HbA1c. -Refill Metformin prescription.  Vit D deficiency: -Check Vitamin D levels. Continue over-the-counter Vitamin D 1000 units daily. -Complete lab work for [employer] and cholesterol and other routine checks.         I have personally spent 40 minutes total time today in preparation, patient care, and documentation for this visit, including the following: review of clinical lab tests; review of medical tests/procedures/services.    She was informed of the importance of frequent follow up visits to maximize her success with intensive lifestyle modifications for her multiple health conditions.    Quillian Quince, MD

## 2022-08-01 NOTE — Progress Notes (Signed)
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

## 2022-08-01 NOTE — Progress Notes (Signed)
Lumbar spine x-ray shows advanced arthritis changes at the base of the spine.

## 2022-08-02 LAB — CMP14+EGFR
ALT: 37 IU/L — ABNORMAL HIGH (ref 0–32)
AST: 37 IU/L (ref 0–40)
Albumin: 4.3 g/dL (ref 3.8–4.9)
Alkaline Phosphatase: 81 IU/L (ref 44–121)
BUN/Creatinine Ratio: 25 — ABNORMAL HIGH (ref 9–23)
BUN: 17 mg/dL (ref 6–24)
Bilirubin Total: 0.8 mg/dL (ref 0.0–1.2)
CO2: 22 mmol/L (ref 20–29)
Calcium: 9 mg/dL (ref 8.7–10.2)
Chloride: 104 mmol/L (ref 96–106)
Creatinine, Ser: 0.68 mg/dL (ref 0.57–1.00)
Globulin, Total: 2.3 g/dL (ref 1.5–4.5)
Glucose: 89 mg/dL (ref 70–99)
Potassium: 4.2 mmol/L (ref 3.5–5.2)
Sodium: 141 mmol/L (ref 134–144)
Total Protein: 6.6 g/dL (ref 6.0–8.5)
eGFR: 105 mL/min/{1.73_m2} (ref >59)

## 2022-08-02 LAB — HEMOGLOBIN A1C
Est. average glucose Bld gHb Est-mCnc: 126 mg/dL
Hgb A1c MFr Bld: 6 % — ABNORMAL HIGH (ref 4.8–5.6)

## 2022-08-02 LAB — LIPID PANEL WITH LDL/HDL RATIO
Cholesterol, Total: 102 mg/dL (ref 100–199)
HDL: 32 mg/dL — ABNORMAL LOW (ref >39)
LDL Chol Calc (NIH): 44 mg/dL (ref 0–99)
LDL/HDL Ratio: 1.4 ratio (ref 0.0–3.2)
Triglycerides: 151 mg/dL — ABNORMAL HIGH (ref 0–149)
VLDL Cholesterol Cal: 26 mg/dL (ref 5–40)

## 2022-08-02 LAB — VITAMIN B12: Vitamin B-12: 920 pg/mL (ref 232–1245)

## 2022-08-02 LAB — VITAMIN D 25 HYDROXY (VIT D DEFICIENCY, FRACTURES): Vit D, 25-Hydroxy: 39 ng/mL (ref 30.0–100.0)

## 2022-08-02 LAB — INSULIN, RANDOM: INSULIN: 16.7 u[IU]/mL (ref 2.6–24.9)

## 2022-08-28 ENCOUNTER — Encounter (INDEPENDENT_AMBULATORY_CARE_PROVIDER_SITE_OTHER): Payer: Self-pay | Admitting: Family Medicine

## 2022-08-28 ENCOUNTER — Ambulatory Visit (INDEPENDENT_AMBULATORY_CARE_PROVIDER_SITE_OTHER): Payer: 59 | Admitting: Family Medicine

## 2022-08-28 VITALS — BP 137/83 | HR 76 | Temp 97.6°F | Ht 66.0 in | Wt 228.0 lb

## 2022-08-28 DIAGNOSIS — E669 Obesity, unspecified: Secondary | ICD-10-CM

## 2022-08-28 DIAGNOSIS — E559 Vitamin D deficiency, unspecified: Secondary | ICD-10-CM

## 2022-08-28 DIAGNOSIS — R7303 Prediabetes: Secondary | ICD-10-CM

## 2022-08-28 DIAGNOSIS — Z6836 Body mass index (BMI) 36.0-36.9, adult: Secondary | ICD-10-CM

## 2022-08-28 MED ORDER — VITAMIN D (ERGOCALCIFEROL) 1.25 MG (50000 UNIT) PO CAPS: 50000.0000 [IU] | ORAL_CAPSULE | ORAL | 0 refills | Status: DC

## 2022-08-28 MED ORDER — ZEPBOUND 2.5 MG/0.5ML ~~LOC~~ SOAJ
2.5000 mg | SUBCUTANEOUS | 0 refills | Status: DC
Start: 2022-08-28 — End: 2022-09-25

## 2022-09-02 ENCOUNTER — Encounter (INDEPENDENT_AMBULATORY_CARE_PROVIDER_SITE_OTHER): Payer: Self-pay | Admitting: Family Medicine

## 2022-09-02 NOTE — Progress Notes (Signed)
Chief Complaint:   OBESITY Ruth Elliott is here to discuss her progress with her obesity treatment plan along with follow-up of her obesity related diagnoses. Ruth Elliott is on the Category 3 Plan and states she is following her eating plan approximately 50% of the time. Ruth Elliott states she is walking for 20 minutes 5 times per week.  Today's visit was #: 44 Starting weight: 223 lbs Starting date: 08/30/2019 Today's weight: 228 lbs Today's date: 08/28/2022 Total lbs lost to date: 0 Total lbs lost since last in-office visit: 0  Interim History: Patient is frustrated with how difficult it is to lose weight.  She has had challenges with getting her insurance to cover medications to treat her disease of obesity.  She is open to trying a different medication.  Subjective:   1. Vitamin D deficiency Patient is on vitamin D OTC, but her level is low and she notes increased fatigue.  2. Prediabetes Patient is on metformin, and she is working on taking this regularly.  She is trying to decrease simple carbohydrates.  Assessment/Plan:   1. Vitamin D deficiency Patient will continue OTC vitamin D 1000 IU daily; she agreed to start prescription vitamin D 50,000 IU once weekly with no refills.  We will recheck labs in 2 to 3 months.  - Vitamin D, Ergocalciferol, (DRISDOL) 1.25 MG (50000 UNIT) CAPS capsule; Take 1 capsule (50,000 Units total) by mouth every 7 (seven) days.  Dispense: 5 capsule; Refill: 0  2. Prediabetes Patient will continue metformin 500 mg twice daily #60, and we will refill for 1 month.  3. Adult BMI 36.0-36.9 kg/sq m  - tirzepatide (ZEPBOUND) 2.5 MG/0.5ML Pen; Inject 2.5 mg into the skin once a week.  Dispense: 2 mL; Refill: 0  4. Obesity, Beginning BMI 35.99 Patient agreed to start Zepbound 2.5 mg once weekly with no refills.  We will follow-up at her next visit in 4 weeks.  - tirzepatide (ZEPBOUND) 2.5 MG/0.5ML Pen; Inject 2.5 mg into the skin once a week.  Dispense: 2 mL;  Refill: 0  Ruth Elliott is currently in the action stage of change. As such, her goal is to continue with weight loss efforts. She has agreed to the Category 3 Plan.   Exercise goals: As is.   Behavioral modification strategies: increasing lean protein intake.  Ruth Elliott has agreed to follow-up with our clinic in 4 weeks. She was informed of the importance of frequent follow-up visits to maximize her success with intensive lifestyle modifications for her multiple health conditions.   Objective:   Blood pressure 137/83, pulse 76, temperature 97.6 F (36.4 C), height 5\' 6"  (1.676 m), weight 228 lb (103.4 kg), SpO2 95%. Body mass index is 36.8 kg/m.  Lab Results  Component Value Date   CREATININE 0.68 07/31/2022   BUN 17 07/31/2022   NA 141 07/31/2022   K 4.2 07/31/2022   CL 104 07/31/2022   CO2 22 07/31/2022   Lab Results  Component Value Date   ALT 37 (H) 07/31/2022   AST 37 07/31/2022   ALKPHOS 81 07/31/2022   BILITOT 0.8 07/31/2022   Lab Results  Component Value Date   HGBA1C 6.0 (H) 07/31/2022   HGBA1C 5.6 01/30/2022   HGBA1C 5.4 08/23/2021   HGBA1C 5.5 03/01/2021   HGBA1C 5.6 01/01/2021   Lab Results  Component Value Date   INSULIN 16.7 07/31/2022   INSULIN 22.3 01/30/2022   INSULIN 12.9 08/23/2021   INSULIN 8.2 03/01/2021   INSULIN 13.4 01/01/2021  Lab Results  Component Value Date   TSH 1.720 01/30/2022   Lab Results  Component Value Date   CHOL 102 07/31/2022   HDL 32 (L) 07/31/2022   LDLCALC 44 07/31/2022   TRIG 151 (H) 07/31/2022   CHOLHDL 2.5 08/09/2019   Lab Results  Component Value Date   VD25OH 39.0 07/31/2022   VD25OH 51.7 08/23/2021   VD25OH 48.5 03/01/2021   Lab Results  Component Value Date   WBC 9.2 01/30/2022   HGB 14.1 01/30/2022   HCT 42.0 01/30/2022   MCV 98 (H) 01/30/2022   PLT 180 01/30/2022   Lab Results  Component Value Date   IRON 172 (H) 01/30/2022   TIBC 309 01/30/2022   FERRITIN 239 (H) 01/30/2022   Attestation  Statements:   Reviewed by clinician on day of visit: allergies, medications, problem list, medical history, surgical history, family history, social history, and previous encounter notes.  I, Burt Knack, am acting as transcriptionist for Quillian Quince, MD.  I have reviewed the above documentation for accuracy and completeness, and I agree with the above. -  Quillian Quince, MD

## 2022-09-03 MED ORDER — METFORMIN HCL 500 MG PO TABS
500.0000 mg | ORAL_TABLET | Freq: Two times a day (BID) | ORAL | 0 refills | Status: DC
Start: 2022-09-03 — End: 2022-09-25

## 2022-09-10 ENCOUNTER — Ambulatory Visit: Payer: 59 | Admitting: Family Medicine

## 2022-09-10 ENCOUNTER — Other Ambulatory Visit: Payer: Self-pay

## 2022-09-10 ENCOUNTER — Encounter: Payer: Self-pay | Admitting: Family Medicine

## 2022-09-10 VITALS — BP 160/98 | HR 77 | Ht 66.0 in | Wt 234.0 lb

## 2022-09-10 DIAGNOSIS — M79601 Pain in right arm: Secondary | ICD-10-CM | POA: Diagnosis not present

## 2022-09-10 NOTE — Patient Instructions (Signed)
Thank you for coming in today.   I think this is overuse or irritation.   Work on stretching and strength.   Ok to use voltaren gel.   Use a hammer and rotate and up and down.   If not better we can add hand therapy.   If needed you could use a thumb spica brace.

## 2022-09-10 NOTE — Progress Notes (Unsigned)
   I, Stevenson Clinch, CMA acting as a scribe for Clementeen Graham, MD.  Ruth Elliott is a 52 y.o. female who presents to [facility] at [facility] today for R arm pain. Pt was previously seen by Dr. Denyse Amass on 07/25/22 for L hip pain.  Today, pt c/o R arm pain and swelling x 2-3 days. Pt locates pain too the right thumb and into the lower arm at the radial aspect. Aching in the lower arm, worsened after driving to [place] this past weekend. 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   Treatments tried: ice/heat  Pertinent review of systems: No fevers or chills  Relevant historical information: Hypertension   Exam:  BP (!) 160/98   Pulse 77   Ht 5\' 6"  (1.676 m)   Wt 234 lb (106.1 kg)   SpO2 96%   BMI 37.77 kg/m  General: Well Developed, well nourished, and in no acute distress.   MSK: Right wrist normal appearing Not particularly tender to palpation.  Normal wrist and hand motion. Intact strength.  Negative Finkelstein's test and Tinel's test.    Lab and Radiology Results  Diagnostic Limited MSK Ultrasound of: Right wrist Radial wrist first dorsal wrist compartment is normal-appearing without obvious tenosynovitis.  Normal-appearing intersection syndrome area. Volar radial wrist normal appearing with no obvious tenosynovitis of the flexor carpi ulnaris tendon. Impression: Normal-appearing MSK ultrasound examination of the radial wrist.     Assessment and Plan: 52 y.o. female with right wrist pain ongoing for about a week.  This thought to be due to overuse.  We talked about some stretching exercises and strengthening exercises that she can do at home.  Additionally recommended a thumb spica splint brace as needed.  If not improved consider hand therapy.   PDMP not reviewed this encounter. Orders Placed This Encounter  Procedures   [name] LIMITED JOINT SPACE STRUCTURES UP RIGHT(NO LINKED CHARGES)    Order Specific Question:   Reason for Exam  (SYMPTOM  OR DIAGNOSIS REQUIRED)    Answer:   right forearm pain    Order Specific Question:   Preferred imaging location?    Answer:   [facility]   No orders of the defined types were placed in this encounter.    Discussed warning signs or symptoms. Please see discharge instructions. Patient expresses understanding.   The above documentation has been reviewed and is accurate and complete Clementeen Graham, M.D.

## 2022-09-11 ENCOUNTER — Telehealth (INDEPENDENT_AMBULATORY_CARE_PROVIDER_SITE_OTHER): Payer: Self-pay | Admitting: *Deleted

## 2022-09-11 NOTE — Telephone Encounter (Signed)
PA submitted via CoverMyMeds and was approved. Patient notified via Mychart.   Ruth Elliott (Key: [id number]) PA Case ID #: [id number] Outcome Approved on August 27 by [employer] 2017 NCPDP Request Reference Number: [id number]. ZEPBOUND INJ 2.5MG  is approved through 03/13/2023. Your patient may now fill this prescription and it will be covered. Authorization Expiration Date: 03/13/2023

## 2022-09-25 ENCOUNTER — Encounter (INDEPENDENT_AMBULATORY_CARE_PROVIDER_SITE_OTHER): Payer: Self-pay | Admitting: Family Medicine

## 2022-09-25 ENCOUNTER — Ambulatory Visit (INDEPENDENT_AMBULATORY_CARE_PROVIDER_SITE_OTHER): Payer: 59 | Admitting: Family Medicine

## 2022-09-25 DIAGNOSIS — R7303 Prediabetes: Secondary | ICD-10-CM | POA: Diagnosis not present

## 2022-09-25 DIAGNOSIS — Z6835 Body mass index (BMI) 35.0-35.9, adult: Secondary | ICD-10-CM

## 2022-09-25 DIAGNOSIS — E669 Obesity, unspecified: Secondary | ICD-10-CM

## 2022-09-25 DIAGNOSIS — E559 Vitamin D deficiency, unspecified: Secondary | ICD-10-CM

## 2022-09-25 MED ORDER — METFORMIN HCL 500 MG PO TABS
500.0000 mg | ORAL_TABLET | Freq: Two times a day (BID) | ORAL | 0 refills | Status: DC
Start: 2022-09-25 — End: 2022-10-24

## 2022-09-25 MED ORDER — ZEPBOUND 2.5 MG/0.5ML ~~LOC~~ SOAJ
2.5000 mg | SUBCUTANEOUS | 0 refills | Status: DC
Start: 2022-09-25 — End: 2022-10-24

## 2022-09-25 MED ORDER — VITAMIN D (ERGOCALCIFEROL) 1.25 MG (50000 UNIT) PO CAPS
50000.0000 [IU] | ORAL_CAPSULE | ORAL | 0 refills | Status: DC
Start: 2022-09-25 — End: 2022-10-23

## 2022-10-02 NOTE — Progress Notes (Unsigned)
Chief Complaint:   OBESITY Ruth Elliott is here to discuss her progress with her obesity treatment plan along with follow-up of her obesity related diagnoses. Ruth Elliott is on the Category 3 Plan and states she is following her eating plan approximately 80% of the time. Ruth Elliott states she is walking for 30 minutes 5 times per week.  Today's visit was #: 45 Starting weight: 223 lbs Starting date: 08/30/2019 Today's weight: 218 lbs Today's date: 09/25/2022 Total lbs lost to date: 5 Total lbs lost since last in-office visit: 10  Interim History: Patient has done well with her weight loss.  She is working on meeting her protein goals.  Her hunger is controlled.  Subjective:   1. Prediabetes Patient is on metformin, and she is working on her diet. No side effects were noted.   2. Vitamin D deficiency Patient is on Vitamin D, but her level is not yet at goal. She requests a refill.   Assessment/Plan:   1. Prediabetes Patient will continue metformin 500 mg BID, and we will refill for 1 month.   - metFORMIN (GLUCOPHAGE) 500 MG tablet; Take 1 tablet (500 mg total) by mouth 2 (two) times daily with a meal.  Dispense: 60 tablet; Refill: 0  2. Vitamin D deficiency Patient will continue once weekly prescription Vitamin D, and we will refill for 1 month.   - Vitamin D, Ergocalciferol, (DRISDOL) 1.25 MG (50000 UNIT) CAPS capsule; Take 1 capsule (50,000 Units total) by mouth every 7 (seven) days.  Dispense: 5 capsule; Refill: 0  3. BMI 35.0-35.9,adult - tirzepatide (ZEPBOUND) 2.5 MG/0.5ML Pen; Inject 2.5 mg into the skin once a week.  Dispense: 2 mL; Refill: 0  4. Obesity, Beginning BMI 35.99 We will refill Zepbound 2.5 mg once weekly for 1 month.   - tirzepatide (ZEPBOUND) 2.5 MG/0.5ML Pen; Inject 2.5 mg into the skin once a week.  Dispense: 2 mL; Refill: 0  Ruth Elliott is currently in the action stage of change. As such, her goal is to continue with weight loss efforts. She has agreed to the Category  3 Plan.   Exercise goals: As is.   Behavioral modification strategies: no skipping meals.  Ruth Elliott has agreed to follow-up with our clinic in 4 weeks. She was informed of the importance of frequent follow-up visits to maximize her success with intensive lifestyle modifications for her multiple health conditions.   Objective:   Blood pressure 118/79, pulse 67, temperature 97.9 F (36.6 C), height 5\' 6"  (1.676 m), weight 218 lb (98.9 kg), SpO2 96%. Body mass index is 35.19 kg/m.  Lab Results  Component Value Date   CREATININE 0.68 07/31/2022   BUN 17 07/31/2022   NA 141 07/31/2022   K 4.2 07/31/2022   CL 104 07/31/2022   CO2 22 07/31/2022   Lab Results  Component Value Date   ALT 37 (H) 07/31/2022   AST 37 07/31/2022   ALKPHOS 81 07/31/2022   BILITOT 0.8 07/31/2022   Lab Results  Component Value Date   HGBA1C 6.0 (H) 07/31/2022   HGBA1C 5.6 01/30/2022   HGBA1C 5.4 08/23/2021   HGBA1C 5.5 03/01/2021   HGBA1C 5.6 01/01/2021   Lab Results  Component Value Date   INSULIN 16.7 07/31/2022   INSULIN 22.3 01/30/2022   INSULIN 12.9 08/23/2021   INSULIN 8.2 03/01/2021   INSULIN 13.4 01/01/2021   Lab Results  Component Value Date   TSH 1.720 01/30/2022   Lab Results  Component Value Date   CHOL 102  07/31/2022   HDL 32 (L) 07/31/2022   LDLCALC 44 07/31/2022   TRIG 151 (H) 07/31/2022   CHOLHDL 2.5 08/09/2019   Lab Results  Component Value Date   VD25OH 39.0 07/31/2022   VD25OH 51.7 08/23/2021   VD25OH 48.5 03/01/2021   Lab Results  Component Value Date   WBC 9.2 01/30/2022   HGB 14.1 01/30/2022   HCT 42.0 01/30/2022   MCV 98 (H) 01/30/2022   PLT 180 01/30/2022   Lab Results  Component Value Date   IRON 172 (H) 01/30/2022   TIBC 309 01/30/2022   FERRITIN 239 (H) 01/30/2022   Attestation Statements:   Reviewed by clinician on day of visit: allergies, medications, problem list, medical history, surgical history, family history, social history, and previous  encounter notes.   I, Burt Knack, am acting as transcriptionist for Quillian Quince, MD.  I have reviewed the above documentation for accuracy and completeness, and I agree with the above. -  Quillian Quince, MD

## 2022-10-17 ENCOUNTER — Other Ambulatory Visit: Payer: Self-pay

## 2022-10-17 ENCOUNTER — Ambulatory Visit: Payer: 59 | Admitting: Family Medicine

## 2022-10-17 ENCOUNTER — Ambulatory Visit: Payer: 59

## 2022-10-17 VITALS — BP 152/92 | HR 69 | Ht 66.0 in | Wt 218.0 lb

## 2022-10-17 DIAGNOSIS — M25572 Pain in left ankle and joints of left foot: Secondary | ICD-10-CM

## 2022-10-17 DIAGNOSIS — L03116 Cellulitis of left lower limb: Secondary | ICD-10-CM

## 2022-10-17 DIAGNOSIS — M25472 Effusion, left ankle: Secondary | ICD-10-CM | POA: Diagnosis not present

## 2022-10-17 MED ORDER — CEFDINIR 300 MG PO CAPS: 300.0000 mg | ORAL_CAPSULE | Freq: Two times a day (BID) | ORAL | 0 refills | Status: DC

## 2022-10-17 MED ORDER — DOXYCYCLINE HYCLATE 100 MG PO TABS: 100.0000 mg | ORAL_TABLET | Freq: Two times a day (BID) | ORAL | 0 refills | Status: DC

## 2022-10-17 NOTE — Progress Notes (Signed)
Rubin Payor, PhD, LAT, ATC acting as a scribe for Clementeen Graham, MD.  Ruth Elliott is a 52 y.o. female who presents to [facility] at [facility] today for L ankle pain. Pt was previously seen by Dr. Denyse Amass on 09/10/22 for R arm pain.  Today, pt c/o L ankle pain ongoing since 9/13. She recalls wearing a pair of ankle boots, w/ a zipper along the side, for a longer period of time and did a lot of stairs wearing the boots. Pt locates pain to the lateral aspect of her L ankle on the distal fibula/lateral malleolus. No hx of gout.  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  She thinks it is possible she may have had a small abrasion in the lateral ankle before the pain and swelling started.  L ankle swelling: yes- varies Aggravates: pressure/rubbing of wearing boots Treatments tried: IBU, ice  Pertinent review of systems: No fevers or chills  Relevant historical information: Hypertension   Exam:  BP (!) 152/92   Pulse 69   Ht 5\' 6"  (1.676 m)   Wt 218 lb (98.9 kg)   LMP 01/20/2017   SpO2 98%   BMI 35.19 kg/m  General: Well Developed, well nourished, and in no acute distress.   MSK: Left ankle moderate lower extremity edema or effusion.  Swelling is most concentrated at the lateral ankle where there is a patch of erythematous skin that is tender to palpation. Decreased ankle motion.  Pulses cap refill and sensation are intact distally.  Calf is nontender.    Lab and Radiology Results  Diagnostic Limited MSK Ultrasound of: Left lateral ankle Significant hypoechoic fluid tracking within subcutaneous tissue consistent with edema or cellulitis.  No significant joint effusion is present.  Minimal peroneal tenosynovitis is present. Impression: Cellulitis or edema  X-ray images left ankle obtained today personally and independently interpreted Soft tissue swelling lateral ankle.  Otherwise  normal-appearing Await formal radiology review  Assessment and Plan: 52 y.o. female with lateral ankle pain and swelling.  I think the most likely explanation is cellulitis.  Will treat with Omnicef and doxycycline.  She is allergic to penicillin with some not quite sure reaction occurring years ago while she had pneumonia.  She denies anaphylaxis.  This should be safe for her to use.  DVT is a possibility but less likely.  Will go ahead and check a vascular ultrasound for DVT as well.  Recheck in 1 week.   PDMP not reviewed this encounter. Orders Placed This Encounter  Procedures   [name] LIMITED JOINT SPACE STRUCTURES LOW LEFT(NO LINKED CHARGES)    Order Specific Question:   Reason for Exam (SYMPTOM  OR DIAGNOSIS REQUIRED)    Answer:   left ankle pain    Order Specific Question:   Preferred imaging location?    Answer:   [facility] Ankle Complete Left    Standing Status:   Future    Number of Occurrences:   1    Standing Expiration Date:   10/17/2023    Order Specific Question:   Reason for Exam (SYMPTOM  OR DIAGNOSIS REQUIRED)    Answer:   left ankle pain, lateral aspect    Order Specific Question:   Preferred imaging location?    Answer:   [facility]    Order Specific Question:   Is patient pregnant?    Answer:  No   Meds ordered this encounter  Medications   cefdinir (OMNICEF) 300 MG capsule    Sig: Take 1 capsule (300 mg total) by mouth 2 (two) times daily for 7 days.    Dispense:  14 capsule    Refill:  0   doxycycline (VIBRA-TABS) 100 MG tablet    Sig: Take 1 tablet (100 mg total) by mouth 2 (two) times daily for 7 days.    Dispense:  14 tablet    Refill:  0     Discussed warning signs or symptoms. Please see discharge instructions. Patient expresses understanding.   The above documentation has been reviewed and is accurate and complete Clementeen Graham, M.D.

## 2022-10-17 NOTE — Patient Instructions (Addendum)
Thank you for coming in today.  Take omnicef and doxycycline twice daily for 1 week.  Ok to take with a little food but not with a lot of calcium or iron. No daily with these.   You should hear about the ultrasound test for blood clot.   Recheck in about 1 week.   Ok to use compression stockings on the ankle.

## 2022-10-21 ENCOUNTER — Ambulatory Visit: Payer: 59 | Admitting: Family

## 2022-10-22 ENCOUNTER — Other Ambulatory Visit: Payer: Self-pay | Admitting: Cardiovascular Disease

## 2022-10-22 DIAGNOSIS — I10 Essential (primary) hypertension: Secondary | ICD-10-CM

## 2022-10-22 NOTE — Progress Notes (Unsigned)
Cardiology Office Note  Date:  10/23/2022   ID:  Ruth Elliott, DOB [birth year], MRN [id number]  PCP:  Mort Sawyers, FNP   Chief Complaint  Patient presents with   Follow-up    12 mo f/u. Meds reviewed verbally. Pt feels well.    HPI:  Ruth Elliott is a 52 year old woman with past medical history of Nodular goiter Recent thyroid surgery with Dr. Jenne Campus Who presents for evaluation of her hypertension  Last seen by myself in clinic October 2023 Reports blood pressure has been well-controlled Typically takes amlodipine 5 mg in the morning, valsartan 80 mg in the evening Rare orthostasis symptoms Tries to hydrate Active at baseline, has dogs, takes care of and rides horses  Continues to work at [employer], chronic stress  Cholesterol numbers well-controlled on no medication non-smoker  History of allergy issues, followed by ENT  A1c 6.0, was eating [place] yogurt and fruit may have contributed up from 5.7  Reports she is on low-dose Zepbound, feels her energy is good Feels the medication may have helped fatigue  EKG personally reviewed by myself on todays visit EKG Interpretation Date/Time:  Wednesday October 23 2022 13:44:52 EDT Ventricular Rate:  68 PR Interval:  174 QRS Duration:  92 QT Interval:  416 QTC Calculation: 442 R Axis:   31  Text Interpretation: Normal sinus rhythm Possible Left atrial enlargement Nonspecific T wave abnormality No previous ECGs available Confirmed by Julien Nordmann [phone]) on 10/23/2022 1:53:16 PM   T wave abnormality seen previously on EKGs since 2017  Of past medical history reviewed BP elevated prior to and following thyroid surgery 170 systolic and higher Given Hydralazine IV, 3 doses post op by anesthesia. Down to 150 systolic with standing at the time of discharge No significant surgical pain reported At 7 PM, patient at home, systolic pressure back up to 170 systolic   She was started on hydralazine 50 mg TID PRN for  systolic pressure >150 Side effects on the hydralazine, had headache  CT scan reviewed showing no carotid atherosclerosis, no aortic arch atherosclerosis  Cholesterol very low on no medication, non-smoker, nondiabetic  PMH:   has a past medical history of Allergic rhinitis, Allergy, Back pain, Endometriosis, Joint pain, Obesity, Thyroid disease, and White coat syndrome with hypertension.  PSH:    Past Surgical History:  Procedure Laterality Date   APPENDECTOMY     NASAL SINUS SURGERY     OVARIAN CYST SURGERY     THYROIDECTOMY Left 11/17/2018   Procedure: THYROIDECTOMY-Hemi Thyroidectomy;  Surgeon: Linus Salmons, MD;  Location: [facility] ORS;  Service: ENT;  Laterality: Left;   TONSILLECTOMY  1994    Current Outpatient Medications  Medication Sig Dispense Refill   amLODipine (NORVASC) 5 MG tablet TAKE 1 TABLET(5 MG) BY MOUTH DAILY 90 tablet 0   B Complex-C-Folic Acid (HM SUPER VITAMIN B COMPLEX/C PO) Take by mouth.     cefdinir (OMNICEF) 300 MG capsule Take 1 capsule (300 mg total) by mouth 2 (two) times daily for 7 days. 14 capsule 0   cetirizine (ZYRTEC) 10 MG chewable tablet Chew 10 mg by mouth daily.     cholecalciferol (VITAMIN D3) 25 MCG (1000 UNIT) tablet Take 1,000 Units by mouth daily.     doxycycline (VIBRA-TABS) 100 MG tablet Take 1 tablet (100 mg total) by mouth 2 (two) times daily for 7 days. 14 tablet 0   Flax Oil-Fish Oil-Borage Oil (FISH OIL-FLAX OIL-BORAGE OIL) CAPS Take 2 capsules by mouth daily.  fluticasone (FLONASE) 50 MCG/ACT nasal spray SHAKE LIQUID AND USE 2 SPRAYS IN EACH NOSTRIL DAILY 16 g 1   ibuprofen (ADVIL) 400 MG tablet Take 400 mg by mouth every 6 (six) hours as needed.     metFORMIN (GLUCOPHAGE) 500 MG tablet Take 1 tablet (500 mg total) by mouth 2 (two) times daily with a meal. 60 tablet 0   Multiple Vitamin (MULTIVITAMIN) capsule Take 1 capsule by mouth daily.     tirzepatide (ZEPBOUND) 2.5 MG/0.5ML Pen Inject 2.5 mg into the skin once a week. 2 mL  0   valsartan (DIOVAN) 80 MG tablet Take 1 tablet (80 mg total) by mouth daily. 90 tablet 3   COLLAGEN PO Take 1 Scoop by mouth daily. Collagen Peptide in coffee in am (Patient not taking: Reported on 10/23/2022)     No current facility-administered medications for this visit.     Allergies:   Penicillins, Hydralazine, Lisinopril, and Singulair [montelukast sodium]   Social History:  The patient  reports that she has never smoked. She has never used smokeless tobacco. She reports current alcohol use. She reports that she does not use drugs.   Family History:   family history includes Alzheimer's disease in her father; COPD in her mother; Diabetes in her mother; Heart disease in her mother; Hypertension in her father and mother.    Review of Systems: Review of Systems  Constitutional: Negative.   HENT: Negative.    Respiratory: Negative.    Cardiovascular: Negative.   Gastrointestinal: Negative.   Musculoskeletal: Negative.   Neurological: Negative.   Psychiatric/Behavioral: Negative.    All other systems reviewed and are negative.   PHYSICAL EXAM: VS:  BP 132/86 (BP Location: Left Arm, Patient Position: Sitting, Cuff Size: Normal)   Pulse 68   Ht 5\' 6"  (1.676 m)   Wt 213 lb 3.2 oz (96.7 kg)   LMP 01/20/2017   SpO2 97%   BMI 34.41 kg/m  , BMI Body mass index is 34.41 kg/m. Constitutional:  oriented to person, place, and time. No distress.  HENT:  Head: Grossly normal Eyes:  no discharge. No scleral icterus.  Neck: No JVD, no carotid bruits  Cardiovascular: Regular rate and rhythm, no murmurs appreciated Pulmonary/Chest: Clear to auscultation bilaterally, no wheezes or rails Abdominal: Soft.  no distension.  no tenderness.  Musculoskeletal: Normal range of motion Neurological:  normal muscle tone. Coordination normal. No atrophy Skin: Skin warm and dry Psychiatric: normal affect, pleasant  Recent Labs: 01/30/2022: Hemoglobin 14.1; Platelets 180; TSH 1.720 07/01/2022:  Magnesium 2.1 07/31/2022: ALT 37; BUN 17; Creatinine, Ser 0.68; Potassium 4.2; Sodium 141    Lipid Panel Lab Results  Component Value Date   CHOL 102 07/31/2022   HDL 32 (L) 07/31/2022   LDLCALC 44 07/31/2022   TRIG 151 (H) 07/31/2022      Wt Readings from Last 3 Encounters:  10/23/22 213 lb 3.2 oz (96.7 kg)  10/17/22 218 lb (98.9 kg)  09/25/22 218 lb (98.9 kg)     ASSESSMENT AND PLAN:  Problem List Items Addressed This Visit       Cardiology Problems   Essential hypertension - Primary   Relevant Orders   EKG 12-Lead (Completed)     Other   Pre-diabetes   Hypertension Recommend we continue amlodipine 5 in the morning valsartan 80 in the evening Blood pressure well-controlled For orthostasis symptoms, hydration On Zepbound  Cardiac risk factors Non-smoker, diabetes numbers well controlled, cholesterol well controlled No for her testing needed  Goiter Prior resection of cyst Followed by endocrine TSH within normal range, 1.7  Prediabetes Low carbohydrates recommended A1c 6.0 On metformin and Zepbound    Total encounter time more than 15 minutes  Greater than 50% was spent in counseling and coordination of care with the patient    Signed, Dossie Arbour, M.D., Ph.D. [facility] [place], [place] [phone]

## 2022-10-23 ENCOUNTER — Ambulatory Visit: Payer: 59 | Attending: Cardiovascular Disease | Admitting: Cardiovascular Disease

## 2022-10-23 ENCOUNTER — Encounter: Payer: Self-pay | Admitting: Cardiovascular Disease

## 2022-10-23 VITALS — BP 132/86 | HR 68 | Ht 66.0 in | Wt 213.2 lb

## 2022-10-23 DIAGNOSIS — R7303 Prediabetes: Secondary | ICD-10-CM | POA: Diagnosis not present

## 2022-10-23 DIAGNOSIS — I10 Essential (primary) hypertension: Secondary | ICD-10-CM

## 2022-10-23 MED ORDER — AMLODIPINE BESYLATE 5 MG PO TABS
ORAL_TABLET | ORAL | 4 refills | Status: DC
Start: 2022-10-23 — End: 2023-12-02

## 2022-10-23 MED ORDER — VALSARTAN 80 MG PO TABS: 80.0000 mg | ORAL_TABLET | Freq: Every day | ORAL | 4 refills | Status: DC

## 2022-10-23 NOTE — Patient Instructions (Signed)
Medication Instructions:  No changes  If you need a refill on your cardiac medications before your next appointment, please call your pharmacy.   Lab work: No new labs needed  Testing/Procedures: No new testing needed  Follow-Up: At [facility], you and your health needs are our priority.  As part of our continuing mission to provide you with exceptional heart care, we have created designated Provider Care Teams.  These Care Teams include your primary Cardiologist (physician) and Advanced Practice Providers (APPs -  Physician Assistants and Nurse Practitioners) who all work together to provide you with the care you need, when you need it.  You will need a follow up appointment as needed  Providers on your designated Care Team:   Christopher Berge, NP Ryan Dunn, PA-C Cadence Furth, PA-C  COVID-19 Vaccine Information can be found at: [email or web address] For questions related to vaccine distribution or appointments, please email [email or web address] or call [phone].    

## 2022-10-24 ENCOUNTER — Ambulatory Visit (INDEPENDENT_AMBULATORY_CARE_PROVIDER_SITE_OTHER): Payer: 59 | Admitting: Family Medicine

## 2022-10-24 ENCOUNTER — Ambulatory Visit: Payer: 59 | Admitting: Family Medicine

## 2022-10-24 ENCOUNTER — Encounter (INDEPENDENT_AMBULATORY_CARE_PROVIDER_SITE_OTHER): Payer: Self-pay | Admitting: Family Medicine

## 2022-10-24 VITALS — BP 116/80 | HR 67 | Ht 66.0 in | Wt 215.0 lb

## 2022-10-24 VITALS — BP 137/77 | HR 65 | Temp 98.5°F | Ht 66.0 in | Wt 211.0 lb

## 2022-10-24 DIAGNOSIS — L03116 Cellulitis of left lower limb: Secondary | ICD-10-CM | POA: Diagnosis not present

## 2022-10-24 DIAGNOSIS — E669 Obesity, unspecified: Secondary | ICD-10-CM | POA: Diagnosis not present

## 2022-10-24 DIAGNOSIS — I10 Essential (primary) hypertension: Secondary | ICD-10-CM | POA: Diagnosis not present

## 2022-10-24 DIAGNOSIS — R7303 Prediabetes: Secondary | ICD-10-CM

## 2022-10-24 DIAGNOSIS — Z6834 Body mass index (BMI) 34.0-34.9, adult: Secondary | ICD-10-CM

## 2022-10-24 MED ORDER — METFORMIN HCL 500 MG PO TABS
500.0000 mg | ORAL_TABLET | Freq: Two times a day (BID) | ORAL | 0 refills | Status: DC
Start: 2022-10-24 — End: 2022-12-23

## 2022-10-24 MED ORDER — CEFDINIR 300 MG PO CAPS: 300.0000 mg | ORAL_CAPSULE | Freq: Two times a day (BID) | ORAL | 0 refills | Status: AC

## 2022-10-24 MED ORDER — DOXYCYCLINE HYCLATE 100 MG PO TABS: 100.0000 mg | ORAL_TABLET | Freq: Two times a day (BID) | ORAL | 0 refills | Status: AC

## 2022-10-24 MED ORDER — ZEPBOUND 2.5 MG/0.5ML ~~LOC~~ SOAJ
2.5000 mg | SUBCUTANEOUS | 0 refills | Status: DC
Start: 2022-10-24 — End: 2022-11-20

## 2022-10-24 NOTE — Progress Notes (Signed)
   Rubin Payor, PhD, LAT, ATC acting as a scribe for Clementeen Graham, MD.  Ruth Elliott is a 52 y.o. female who presents to [facility] at [facility] today for f/u L ankle pain. Pt was last seen by Dr. Denyse Amass on 10/17/22 and was prescribed Omnicef and doxycycline and a vasc US was ordered.  She took her last dose of antibiotics this morning.  Today, pt reports L ankle is feeling much better. Redness has resolved. Slight swelling present, but that seems to be her norm.  Dx testing: 10/25/22 LE vasc US--- scheduled 10/17/22 L ankle XR  Pertinent review of systems: No fevers or chills  Relevant historical information: Hypertension   Exam:  BP 116/80   Pulse 67   Ht 5\' 6"  (1.676 m)   Wt 215 lb (97.5 kg)   LMP 01/20/2017   SpO2 98%   BMI 34.70 kg/m  General: Well Developed, well nourished, and in no acute distress.   MSK: Left ankle minimal swelling.  No erythema.  Nontender.      Assessment and Plan: 52 y.o. female with resolved cellulitis left ankle.  She responded extremely well to [facility] and doxycycline.  Her last dose was this morning.  I do not think she requires any further treatment.  I think it is okay for her to cancel her vascular ultrasound scheduled for tomorrow.  I did refill the Omnicef and doxycycline for use if her symptoms worsen or return in the next day or 2.  Today is Thursday and I am out of the clinic Friday and over the weekend and I am worried if her symptoms return I want her to have something that she can do at least.  I do not expect her to actually take the refilled antibiotics.   PDMP not reviewed this encounter. No orders of the defined types were placed in this encounter.  Meds ordered this encounter  Medications   cefdinir (OMNICEF) 300 MG capsule    Sig: Take 1 capsule (300 mg total) by mouth 2 (two) times daily for 7 days.    Dispense:  14 capsule    Refill:  0   doxycycline (VIBRA-TABS) 100 MG tablet    Sig: Take 1  tablet (100 mg total) by mouth 2 (two) times daily for 7 days.    Dispense:  14 tablet    Refill:  0     Discussed warning signs or symptoms. Please see discharge instructions. Patient expresses understanding.   The above documentation has been reviewed and is accurate and complete Clementeen Graham, M.D.

## 2022-10-24 NOTE — Patient Instructions (Addendum)
Thank you for coming in today.   Ok to cancel the vascular ultrasound for tomorrow.   I dont think you will need further antibiotics but I did refill them in case your redness, pain and swelling return. Take and let me know.

## 2022-10-24 NOTE — Progress Notes (Signed)
.smr  Office: [phone]  /  Fax: [phone]  WEIGHT SUMMARY AND BIOMETRICS  Anthropometric Measurements Height: 5\' 6"  (1.676 m) Weight: 211 lb (95.7 kg) BMI (Calculated): 34.07 Weight at Last Visit: 218 lb Weight Lost Since Last Visit: 7 lb Weight Gained Since Last Visit: 0 Starting Weight: 223 lb Total Weight Loss (lbs): 12 lb (5.443 kg)   Body Composition  Body Fat %: 42.6 % Fat Mass (lbs): 90 lbs Muscle Mass (lbs): 115 lbs Total Body Water (lbs): 87.8 lbs Visceral Fat Rating : 11   Other Clinical Data Fasting: No Labs: No Today's Visit #: 22 Starting Date: 08/30/19    Chief Complaint: OBESITY  History of Present Illness   The patient, with a history of prediabetes, hypertension, and obesity, has been adhering to a category three eating plan 80% of the time, resulting in a weight loss of seven pounds over the past month. She has been engaging in regular physical activity, walking approximately twenty minutes five times per week. Despite these lifestyle changes, her blood pressure reading today was 137/80. She is currently on amlodipine and valsartan for hypertension, metformin 500mg  twice daily for prediabetes, and Zepbound for both prediabetes and obesity.  The patient recently experienced an infection in her ankle, suspected to be cellulitis, following an abrasion. She was treated with antibiotics, which she tolerated well, and the infection has since resolved. She has not experienced any adverse effects from her current dosage of Zepbound.  The patient's work schedule has been particularly demanding recently, which has impacted her dietary habits. Despite this, she has been making an effort to consume protein-rich foods and maintain her hydration. She has also been managing her stress levels and ensuring she gets regular outdoor exercise with her dog.          PHYSICAL EXAM:  Blood pressure 137/77, pulse 65, temperature 98.5 F (36.9 C), height 5\' 6"  (1.676  m), weight 211 lb (95.7 kg), last menstrual period 01/20/2017, SpO2 98%. Body mass index is 34.06 kg/m.  DIAGNOSTIC DATA REVIEWED:  BMET    Component Value Date/Time   NA 141 07/31/2022 1437   K 4.2 07/31/2022 1437   CL 104 07/31/2022 1437   CO2 22 07/31/2022 1437   GLUCOSE 89 07/31/2022 1437   BUN 17 07/31/2022 1437   CREATININE 0.68 07/31/2022 1437   CALCIUM 9.0 07/31/2022 1437   GFRNONAA 105 02/14/2020 1204   GFRAA 121 02/14/2020 1204   Lab Results  Component Value Date   HGBA1C 6.0 (H) 07/31/2022   HGBA1C 5.4 08/31/2013   Lab Results  Component Value Date   INSULIN 16.7 07/31/2022   INSULIN 12.7 08/30/2019   Lab Results  Component Value Date   TSH 1.720 01/30/2022   CBC    Component Value Date/Time   WBC 9.2 01/30/2022 0849   RBC 4.29 01/30/2022 0849   HGB 14.1 01/30/2022 0849   HCT 42.0 01/30/2022 0849   PLT 180 01/30/2022 0849   MCV 98 (H) 01/30/2022 0849   MCH 32.9 01/30/2022 0849   MCHC 33.6 01/30/2022 0849   RDW 12.6 01/30/2022 0849   Iron Studies    Component Value Date/Time   IRON 172 (H) 01/30/2022 0849   TIBC 309 01/30/2022 0849   FERRITIN 239 (H) 01/30/2022 0849   IRONPCTSAT 56 (H) 01/30/2022 0849   Lipid Panel     Component Value Date/Time   CHOL 102 07/31/2022 1437   TRIG 151 (H) 07/31/2022 1437   HDL 32 (L) 07/31/2022 1437  CHOLHDL 2.5 08/09/2019 1047   LDLCALC 44 07/31/2022 1437   Hepatic Function Panel     Component Value Date/Time   PROT 6.6 07/31/2022 1437   ALBUMIN 4.3 07/31/2022 1437   AST 37 07/31/2022 1437   ALT 37 (H) 07/31/2022 1437   ALKPHOS 81 07/31/2022 1437   BILITOT 0.8 07/31/2022 1437   BILIDIR 0.13 09/13/2019 1117      Component Value Date/Time   TSH 1.720 01/30/2022 0849   Nutritional Lab Results  Component Value Date   VD25OH 39.0 07/31/2022   VD25OH 51.7 08/23/2021   VD25OH 48.5 03/01/2021     Assessment and Plan    Prediabetes Controlled with Metformin 500mg  twice daily and Zepbound 2.5mg   daily. Patient reports good adherence to medication regimen and has made significant lifestyle modifications including dietary changes and regular exercise. -Continue current medication regimen. -Change Metformin prescription to 90-day supply to align with insurance requirements.  Hypertension Controlled with Amlodipine and Valsartan. Blood pressure slightly elevated today, possibly due to stress. -Continue current medication regimen. -Monitor blood pressure.  Obesity Patient has lost seven pounds in the last month through dietary changes and regular exercise. Currently on Zepbound 2.5mg  daily for weight management. -Continue current medication regimen and lifestyle modifications. -Refill Zepbound prescription   Cellulitis Recently treated with antibiotics for suspected cellulitis in the ankle. No current symptoms. -No further action required at this time.  Follow-up in one month.       She was informed of the importance of frequent follow up visits to maximize her success with intensive lifestyle modifications for her multiple health conditions.    Quillian Quince, MD

## 2022-10-25 ENCOUNTER — Ambulatory Visit (HOSPITAL_COMMUNITY): Payer: 59

## 2022-11-08 NOTE — Progress Notes (Signed)
Left ankle x-ray shows a little bit of arthritis.

## 2022-11-20 ENCOUNTER — Ambulatory Visit (INDEPENDENT_AMBULATORY_CARE_PROVIDER_SITE_OTHER): Payer: 59 | Admitting: Family Medicine

## 2022-11-20 ENCOUNTER — Encounter (INDEPENDENT_AMBULATORY_CARE_PROVIDER_SITE_OTHER): Payer: Self-pay | Admitting: Family Medicine

## 2022-11-20 VITALS — BP 123/67 | HR 65 | Temp 98.1°F | Ht 66.0 in | Wt 213.0 lb

## 2022-11-20 DIAGNOSIS — Z6834 Body mass index (BMI) 34.0-34.9, adult: Secondary | ICD-10-CM

## 2022-11-20 DIAGNOSIS — E669 Obesity, unspecified: Secondary | ICD-10-CM

## 2022-11-20 DIAGNOSIS — R632 Polyphagia: Secondary | ICD-10-CM

## 2022-11-20 DIAGNOSIS — I10 Essential (primary) hypertension: Secondary | ICD-10-CM

## 2022-11-20 MED ORDER — ZEPBOUND 5 MG/0.5ML ~~LOC~~ SOAJ: 5.0000 mg | SUBCUTANEOUS | 0 refills | Status: DC

## 2022-11-20 NOTE — Progress Notes (Signed)
.smr  Office: [phone]  /  Fax: [phone]  WEIGHT SUMMARY AND BIOMETRICS  Anthropometric Measurements Height: 5\' 6"  (1.676 m) Weight: 213 lb (96.6 kg) BMI (Calculated): 34.4 Weight at Last Visit: 211 lb Weight Lost Since Last Visit: 0 Weight Gained Since Last Visit: 2 lb Starting Weight: 223 lb Total Weight Loss (lbs): 10 lb (4.536 kg)   Body Composition  Body Fat %: 44.8 % Fat Mass (lbs): 95.4 lbs Muscle Mass (lbs): 111.6 lbs Total Body Water (lbs): 90.4 lbs Visceral Fat Rating : 12   Other Clinical Data Fasting: No Labs: No Today's Visit #: 68 Starting Date: 08/30/19    Chief Complaint: OBESITY    History of Present Illness   The patient, with a history of obesity, polyphagia, and hypertension, presents for a follow-up visit. She reports a weight gain of two pounds over the past month, despite adhering to her category three eating plan approximately 80% of the time. She has been physically active, engaging in walking and horse riding for thirty minutes, five times per week.  The patient is currently on Zepbound 2.5 mg for polyphagia and requests a refill. She has taken approximately ten injections so far without any gastrointestinal issues. She also reports that her energy levels have been slightly off due to poor sleep quality.  For hypertension, the patient is on amlodipine 5 mg and valsartan 80 mg, and her blood pressure is well controlled at 123/67. She reports feeling a bit lightheaded, which she attributes to lack of sleep and possible dehydration.  The patient also mentions a recent change in her work responsibilities, which has increased her stress levels. Despite these challenges, she is making efforts to maintain her physical activity and dietary regimen. She expresses an interest in adding strength training to her routine and is considering yoga for stress management.          PHYSICAL EXAM:  Blood pressure 123/67, pulse 65, temperature 98.1 F  (36.7 C), height 5\' 6"  (1.676 m), weight 213 lb (96.6 kg), last menstrual period 01/20/2017, SpO2 98%. Body mass index is 34.38 kg/m.  DIAGNOSTIC DATA REVIEWED:  BMET    Component Value Date/Time   NA 141 07/31/2022 1437   K 4.2 07/31/2022 1437   CL 104 07/31/2022 1437   CO2 22 07/31/2022 1437   GLUCOSE 89 07/31/2022 1437   BUN 17 07/31/2022 1437   CREATININE 0.68 07/31/2022 1437   CALCIUM 9.0 07/31/2022 1437   GFRNONAA 105 02/14/2020 1204   GFRAA 121 02/14/2020 1204   Lab Results  Component Value Date   HGBA1C 6.0 (H) 07/31/2022   HGBA1C 5.4 08/31/2013   Lab Results  Component Value Date   INSULIN 16.7 07/31/2022   INSULIN 12.7 08/30/2019   Lab Results  Component Value Date   TSH 1.720 01/30/2022   CBC    Component Value Date/Time   WBC 9.2 01/30/2022 0849   RBC 4.29 01/30/2022 0849   HGB 14.1 01/30/2022 0849   HCT 42.0 01/30/2022 0849   PLT 180 01/30/2022 0849   MCV 98 (H) 01/30/2022 0849   MCH 32.9 01/30/2022 0849   MCHC 33.6 01/30/2022 0849   RDW 12.6 01/30/2022 0849   Iron Studies    Component Value Date/Time   IRON 172 (H) 01/30/2022 0849   TIBC 309 01/30/2022 0849   FERRITIN 239 (H) 01/30/2022 0849   IRONPCTSAT 56 (H) 01/30/2022 0849   Lipid Panel     Component Value Date/Time   CHOL 102 07/31/2022 1437  TRIG 151 (H) 07/31/2022 1437   HDL 32 (L) 07/31/2022 1437   CHOLHDL 2.5 08/09/2019 1047   LDLCALC 44 07/31/2022 1437   Hepatic Function Panel     Component Value Date/Time   PROT 6.6 07/31/2022 1437   ALBUMIN 4.3 07/31/2022 1437   AST 37 07/31/2022 1437   ALT 37 (H) 07/31/2022 1437   ALKPHOS 81 07/31/2022 1437   BILITOT 0.8 07/31/2022 1437   BILIDIR 0.13 09/13/2019 1117      Component Value Date/Time   TSH 1.720 01/30/2022 0849   Nutritional Lab Results  Component Value Date   VD25OH 39.0 07/31/2022   VD25OH 51.7 08/23/2021   VD25OH 48.5 03/01/2021     Assessment and Plan    Obesity Weight gain of 2 pounds over the  past month. Adherence to category three eating plan at 80% and maintaining physical activity. Currently on Zepbound 2.5mg  for polyphagia. No adverse effects reported. -Increase Zepbound to 5mg . -Continue category three eating plan and physical activity. -Consider adding strengthening exercises, particularly for core and thighs.  Hypertension Well controlled on Amlodipine 5mg  and Valsartan 80mg . Blood pressure today 123/67. Reports of occasional lightheadedness, possibly related to dehydration and poor sleep. -Continue Amlodipine 5mg  and Valsartan 80mg . -Encourage hydration and monitor for persistent lightheadedness.  Follow-up in 1 month.       She was informed of the importance of frequent follow up visits to maximize her success with intensive lifestyle modifications for her multiple health conditions.    Quillian Quince, MD

## 2022-12-23 ENCOUNTER — Encounter (INDEPENDENT_AMBULATORY_CARE_PROVIDER_SITE_OTHER): Payer: Self-pay | Admitting: Family Medicine

## 2022-12-23 ENCOUNTER — Ambulatory Visit (INDEPENDENT_AMBULATORY_CARE_PROVIDER_SITE_OTHER): Payer: 59 | Admitting: Family Medicine

## 2022-12-23 VITALS — BP 114/73 | HR 70 | Temp 98.4°F | Ht 66.0 in | Wt 209.0 lb

## 2022-12-23 DIAGNOSIS — I10 Essential (primary) hypertension: Secondary | ICD-10-CM | POA: Diagnosis not present

## 2022-12-23 DIAGNOSIS — R7303 Prediabetes: Secondary | ICD-10-CM | POA: Diagnosis not present

## 2022-12-23 DIAGNOSIS — R632 Polyphagia: Secondary | ICD-10-CM

## 2022-12-23 DIAGNOSIS — Z6833 Body mass index (BMI) 33.0-33.9, adult: Secondary | ICD-10-CM

## 2022-12-23 DIAGNOSIS — E669 Obesity, unspecified: Secondary | ICD-10-CM

## 2022-12-23 MED ORDER — ZEPBOUND 5 MG/0.5ML ~~LOC~~ SOAJ: 5.0000 mg | SUBCUTANEOUS | 0 refills | Status: DC

## 2022-12-23 MED ORDER — METFORMIN HCL 500 MG PO TABS: 500.0000 mg | ORAL_TABLET | Freq: Two times a day (BID) | ORAL | 0 refills | Status: DC

## 2022-12-23 NOTE — Progress Notes (Signed)
.smr  Office: [phone]  /  Fax: [phone]  WEIGHT SUMMARY AND BIOMETRICS  Anthropometric Measurements Height: 5\' 6"  (1.676 m) Weight: 209 lb (94.8 kg) BMI (Calculated): 33.75 Weight at Last Visit: 213 lb Weight Lost Since Last Visit: 4 lb Weight Gained Since Last Visit: 0 Starting Weight: 223 lb Total Weight Loss (lbs): 14 lb (6.35 kg)   Body Composition  Body Fat %: 41.7 % Fat Mass (lbs): 87.2 lbs Muscle Mass (lbs): 115.6 lbs Total Body Water (lbs): 86.2 lbs Visceral Fat Rating : 11   Other Clinical Data Fasting: No Labs: no Today's Visit #: 48 Starting Date: 08/30/19    Chief Complaint: OBESITY   History of Present Illness   The patient, with a history of prediabetes, obesity, and hypertension, presents for a follow-up visit. Her blood pressure is well controlled on current medication regimen, which includes metformin for prediabetes and Zepbound for polyphasia. She reports a weight loss of four pounds since the last visit a month ago, attributing this to adherence to a category three eating plan 80% of the time and a regular exercise routine of walking for 20 minutes five times per week.  Despite the holiday season, the patient has managed to avoid dietary temptations and maintain her weight loss. She reports a busy work schedule with minimal opportunities for indulgence. Her diet primarily consists of protein-rich foods, including cheese sticks, mixed nuts, Greek yogurt, and [employer] [place] sausage McMuffins for breakfast. She expresses concern about not consuming enough vegetables and aims to incorporate more into her meals.  The patient reports a recent episode of stomach cramps, which coincided with an increase in her Zepbound dosage. However, she also notes that she had started cycling again around the same time. She has since halved her valsartan dosage due to dizziness, with no adverse effects reported.  The patient also reports issues with sleep,  primarily due to discomfort from her pillows, leading to neck pain and migraines. She has been seeing a chiropractor for this issue. Despite the physical discomfort and high stress levels from work, the patient's sleep is not significantly affected by stress. She has noticed some spotting, which she attributes to hormonal changes.          PHYSICAL EXAM:  Blood pressure 114/73, pulse 70, temperature 98.4 F (36.9 C), height 5\' 6"  (1.676 m), weight 209 lb (94.8 kg), last menstrual period 01/20/2017, SpO2 97%. Body mass index is 33.73 kg/m.  DIAGNOSTIC DATA REVIEWED:  BMET    Component Value Date/Time   NA 141 07/31/2022 1437   K 4.2 07/31/2022 1437   CL 104 07/31/2022 1437   CO2 22 07/31/2022 1437   GLUCOSE 89 07/31/2022 1437   BUN 17 07/31/2022 1437   CREATININE 0.68 07/31/2022 1437   CALCIUM 9.0 07/31/2022 1437   GFRNONAA 105 02/14/2020 1204   GFRAA 121 02/14/2020 1204   Lab Results  Component Value Date   HGBA1C 6.0 (H) 07/31/2022   HGBA1C 5.4 08/31/2013   Lab Results  Component Value Date   INSULIN 16.7 07/31/2022   INSULIN 12.7 08/30/2019   Lab Results  Component Value Date   TSH 1.720 01/30/2022   CBC    Component Value Date/Time   WBC 9.2 01/30/2022 0849   RBC 4.29 01/30/2022 0849   HGB 14.1 01/30/2022 0849   HCT 42.0 01/30/2022 0849   PLT 180 01/30/2022 0849   MCV 98 (H) 01/30/2022 0849   MCH 32.9 01/30/2022 0849   MCHC 33.6 01/30/2022 0849  RDW 12.6 01/30/2022 0849   Iron Studies    Component Value Date/Time   IRON 172 (H) 01/30/2022 0849   TIBC 309 01/30/2022 0849   FERRITIN 239 (H) 01/30/2022 0849   IRONPCTSAT 56 (H) 01/30/2022 0849   Lipid Panel     Component Value Date/Time   CHOL 102 07/31/2022 1437   TRIG 151 (H) 07/31/2022 1437   HDL 32 (L) 07/31/2022 1437   CHOLHDL 2.5 08/09/2019 1047   LDLCALC 44 07/31/2022 1437   Hepatic Function Panel     Component Value Date/Time   PROT 6.6 07/31/2022 1437   ALBUMIN 4.3 07/31/2022 1437    AST 37 07/31/2022 1437   ALT 37 (H) 07/31/2022 1437   ALKPHOS 81 07/31/2022 1437   BILITOT 0.8 07/31/2022 1437   BILIDIR 0.13 09/13/2019 1117      Component Value Date/Time   TSH 1.720 01/30/2022 0849   Nutritional Lab Results  Component Value Date   VD25OH 39.0 07/31/2022   VD25OH 51.7 08/23/2021   VD25OH 48.5 03/01/2021     Assessment and Plan    Prediabetes   Prediabetes managed with metformin. She has lost 4 pounds in the last month, adhering to the category three eating plan 80% of the time, and walking 20 minutes five times per week. No issues with metformin.   - Continue metformin   - Encourage adherence to the category three eating plan   - Continue current exercise regimen   - Follow up in one month    Obesity   She has lost 4 pounds since the last visit, focusing on high-protein foods but concerned about vegetable intake. Discussed incorporating at least four servings of vegetables per day. Managing work-related stress and sleep issues due to neck pain and pillow discomfort.   - Encourage consumption of at least four servings of vegetables per day   - Continue current dietary plan focusing on high-protein foods   - Monitor weight loss progress   - Recommend continue trying different pillows for better sleep    Hypertension   Hypertension well controlled with a blood pressure of 114/73 mmHg. Reduced valsartan dose to 40 mg due to dizziness, as advised by Dr. Lindajo Royal. Consider further reduction or discontinuation if blood pressure remains well controlled.   - Continue valsartan at 40 mg   - Monitor blood pressure regularly   - Consider further reduction or discontinuation of valsartan if blood pressure remains well controlled    Polyphagia   Polyphagia managed with Zepbound 5 mg. No significant issues reported.   - Continue Zepbound at 5 mg   - Monitor for any side effects or changes in symptoms   - Follow up in one month    General Health Maintenance    Discussed maintaining a balanced diet with adequate vegetable intake and managing stress levels.   - Encourage stress management techniques   - Ensure balanced diet with adequate vegetable intake    Follow-up   - Follow up on January 7th   - Schedule February appointment.        She was informed of the importance of frequent follow up visits to maximize her success with intensive lifestyle modifications for her multiple health conditions.    Quillian Quince, MD

## 2023-01-21 ENCOUNTER — Ambulatory Visit (INDEPENDENT_AMBULATORY_CARE_PROVIDER_SITE_OTHER): Payer: 59 | Admitting: Family Medicine

## 2023-01-21 ENCOUNTER — Encounter (INDEPENDENT_AMBULATORY_CARE_PROVIDER_SITE_OTHER): Payer: Self-pay | Admitting: Family Medicine

## 2023-01-21 VITALS — BP 143/77 | HR 73 | Temp 98.5°F | Ht 66.0 in | Wt 210.0 lb

## 2023-01-21 DIAGNOSIS — I10 Essential (primary) hypertension: Secondary | ICD-10-CM | POA: Diagnosis not present

## 2023-01-21 DIAGNOSIS — Z6833 Body mass index (BMI) 33.0-33.9, adult: Secondary | ICD-10-CM | POA: Diagnosis not present

## 2023-01-21 DIAGNOSIS — E669 Obesity, unspecified: Secondary | ICD-10-CM | POA: Diagnosis not present

## 2023-01-21 DIAGNOSIS — R7303 Prediabetes: Secondary | ICD-10-CM

## 2023-01-21 MED ORDER — METFORMIN HCL 500 MG PO TABS: 500.0000 mg | ORAL_TABLET | Freq: Two times a day (BID) | ORAL | 0 refills | Status: DC

## 2023-01-21 MED ORDER — ZEPBOUND 5 MG/0.5ML ~~LOC~~ SOAJ: 5.0000 mg | SUBCUTANEOUS | 0 refills | Status: DC

## 2023-01-21 NOTE — Progress Notes (Signed)
 .smr  Office: [phone]  /  Fax: [phone]  WEIGHT SUMMARY AND BIOMETRICS  Anthropometric Measurements Height: 5' 6 (1.676 m) Weight: 210 lb (95.3 kg) BMI (Calculated): 33.91 Weight at Last Visit: 209 lb Weight Lost Since Last Visit: 0 Weight Gained Since Last Visit: 1 lb Starting Weight: 223 lb Total Weight Loss (lbs): 13 lb (5.897 kg)   Body Composition  Body Fat %: 43.6 % Fat Mass (lbs): 91.6 lbs Muscle Mass (lbs): 112.4 lbs Total Body Water (lbs): 89.8 lbs Visceral Fat Rating : 11   Other Clinical Data Fasting: No Labs: No Today's Visit #: 28 Starting Date: 08/30/19    Chief Complaint: OBESITY   History of Present Illness   The patient, with a history of prediabetes and obesity, has been managing her conditions with Zepbound  and Metformin , along with diet, exercise, and weight loss efforts. She reports successful weight management over the holiday period, with only a one-pound gain. She requests a refill of her current medications. The patient also has a history of hypertension with white coat syndrome, and her blood pressure was mildly elevated at the time of the consultation. She is currently on Norvasc  and Dioban for blood pressure management.  The patient reports no issues with her current medications, denying any gastrointestinal problems. She also reports a high energy level that lasts until mid-week before tapering off. She has not been able to exercise as much due to an issue with plantar fasciitis in one foot, which she believes was aggravated by a new pair of shoes. She also suspects an infection in some corns on her foot and has scheduled an appointment with her primary care provider for evaluation. Despite these issues, the patient has been trying to stay active, using a stepper during movie watching and walking when weather permits.  The patient's work situation is currently undergoing significant changes, which she reports as not causing her stress.  She is due for a PT test in the near future, which may require a change in her next appointment.          PHYSICAL EXAM:  Blood pressure (!) 143/77, pulse 73, temperature 98.5 F (36.9 C), height 5' 6 (1.676 m), weight 210 lb (95.3 kg), last menstrual period 01/20/2017, SpO2 96%. Body mass index is 33.89 kg/m.  DIAGNOSTIC DATA REVIEWED:  BMET    Component Value Date/Time   NA 141 07/31/2022 1437   K 4.2 07/31/2022 1437   CL 104 07/31/2022 1437   CO2 22 07/31/2022 1437   GLUCOSE 89 07/31/2022 1437   BUN 17 07/31/2022 1437   CREATININE 0.68 07/31/2022 1437   CALCIUM 9.0 07/31/2022 1437   GFRNONAA 105 02/14/2020 1204   GFRAA 121 02/14/2020 1204   Lab Results  Component Value Date   HGBA1C 6.0 (H) 07/31/2022   HGBA1C 5.4 08/31/2013   Lab Results  Component Value Date   INSULIN  16.7 07/31/2022   INSULIN  12.7 08/30/2019   Lab Results  Component Value Date   TSH 1.720 01/30/2022   CBC    Component Value Date/Time   WBC 9.2 01/30/2022 0849   RBC 4.29 01/30/2022 0849   HGB 14.1 01/30/2022 0849   HCT 42.0 01/30/2022 0849   PLT 180 01/30/2022 0849   MCV 98 (H) 01/30/2022 0849   MCH 32.9 01/30/2022 0849   MCHC 33.6 01/30/2022 0849   RDW 12.6 01/30/2022 0849   Iron Studies    Component Value Date/Time   IRON 172 (H) 01/30/2022 0849   TIBC 309  01/30/2022 0849   FERRITIN 239 (H) 01/30/2022 0849   IRONPCTSAT 56 (H) 01/30/2022 0849   Lipid Panel     Component Value Date/Time   CHOL 102 07/31/2022 1437   TRIG 151 (H) 07/31/2022 1437   HDL 32 (L) 07/31/2022 1437   CHOLHDL 2.5 08/09/2019 1047   LDLCALC 44 07/31/2022 1437   Hepatic Function Panel     Component Value Date/Time   PROT 6.6 07/31/2022 1437   ALBUMIN 4.3 07/31/2022 1437   AST 37 07/31/2022 1437   ALT 37 (H) 07/31/2022 1437   ALKPHOS 81 07/31/2022 1437   BILITOT 0.8 07/31/2022 1437   BILIDIR 0.13 09/13/2019 1117      Component Value Date/Time   TSH 1.720 01/30/2022 0849   Nutritional Lab  Results  Component Value Date   VD25OH 39.0 07/31/2022   VD25OH 51.7 08/23/2021   VD25OH 48.5 03/01/2021     Assessment and Plan    Obesity Actively working on weight loss, using a stepper and walking when possible. Limited by plantar fasciitis and cold weather. Blood pressure mildly elevated at 143/77, currently on Norvasc  and Diovan . Continued focus on diet, exercise, and weight loss to improve blood pressure. - Continue weight loss efforts - Address plantar fasciitis with primary care provider - Continue Norvasc  - Continue Diovan  - Continue diet, exercise, and weight loss regimen  Prediabetes Managing prediabetes with metformin  and Zepbound , along with diet, exercise, and weight loss. Gained only one pound over the holidays. No gastrointestinal issues with current medications. Prefers not to adjust dose as current regimen maintains energy levels and appetite control until mid-week. - Refill metformin  - Refill Zepbound  - Continue diet, exercise, and weight loss regimen  HTN with white coat syndrome -Continue current medications -Continue diet/exercise/weight loss  Follow-up - Adjust February 4th appointment - Schedule March appointment.        She was informed of the importance of frequent follow up visits to maximize her success with intensive lifestyle modifications for her multiple health conditions.    [name], MD

## 2023-01-22 ENCOUNTER — Encounter: Payer: Self-pay | Admitting: Family

## 2023-01-22 ENCOUNTER — Ambulatory Visit: Payer: 59 | Admitting: Family

## 2023-01-22 VITALS — BP 134/86 | HR 80 | Temp 98.3°F | Ht 66.0 in | Wt 218.0 lb

## 2023-01-22 DIAGNOSIS — B070 Plantar wart: Secondary | ICD-10-CM | POA: Insufficient documentation

## 2023-01-22 NOTE — Progress Notes (Signed)
 Established Patient Office Visit  Subjective:   Patient ID: Ruth Elliott, female    DOB: [birth year]  Age: 53 y.o. MRN: [id number]  CC:  Chief Complaint  Patient presents with   Acute Visit    R foot pain x1 week.    HPI: Ruth Elliott is a 53 y.o. female presenting on 01/22/2023 for Acute Visit (R foot pain x1 week.)  Right lower foot with possible infection. She got some new shoes, and she has noticed redness right lateral pinky toe, with some tenderness along something that appears to be a plantar wart. She states she has had one removed in the past by freezing.   She denies fever.        ROS: Negative unless specifically indicated above in HPI.   Relevant past medical history reviewed and updated as indicated.   Allergies and medications reviewed and updated.   Current Outpatient Medications:    amLODipine  (NORVASC ) 5 MG tablet, TAKE 1 TABLET(5 MG) BY MOUTH DAILY, Disp: 90 tablet, Rfl: 4   B Complex-C-Folic Acid (HM SUPER VITAMIN B COMPLEX/C PO), Take by mouth., Disp: , Rfl:    cetirizine (ZYRTEC) 10 MG chewable tablet, Chew 10 mg by mouth daily., Disp: , Rfl:    cholecalciferol (VITAMIN D3) 25 MCG (1000 UNIT) tablet, Take 1,000 Units by mouth daily., Disp: , Rfl:    Flax Oil-Fish Oil-Borage Oil (FISH OIL-FLAX OIL-BORAGE OIL) CAPS, Take 2 capsules by mouth daily. , Disp: , Rfl:    fluticasone  (FLONASE ) 50 MCG/ACT nasal spray, SHAKE LIQUID AND USE 2 SPRAYS IN EACH NOSTRIL DAILY, Disp: 16 g, Rfl: 1   ibuprofen (ADVIL) 400 MG tablet, Take 400 mg by mouth every 6 (six) hours as needed., Disp: , Rfl:    metFORMIN  (GLUCOPHAGE ) 500 MG tablet, Take 1 tablet (500 mg total) by mouth 2 (two) times daily with a meal., Disp: 60 tablet, Rfl: 0   Multiple Vitamin (MULTIVITAMIN) capsule, Take 1 capsule by mouth daily., Disp: , Rfl:    tirzepatide  (ZEPBOUND ) 5 MG/0.5ML Pen, Inject 5 mg into the skin once a week., Disp: 2 mL, Rfl: 0   valsartan  (DIOVAN ) 80 MG tablet, Take 1 tablet  (80 mg total) by mouth daily., Disp: 90 tablet, Rfl: 4  Allergies  Allergen Reactions   Penicillins Shortness Of Breath    DID tolerate Omnicef  October 2024    Hydralazine      Elevated blood pressure, migraine, and lethary   Lisinopril      paresthesia   Singulair [Montelukast Sodium]     Moody    Objective:   BP 134/86 (BP Location: Left Arm, Patient Position: Sitting, Cuff Size: Normal)   Pulse 80   Temp 98.3 F (36.8 C) (Temporal)   Ht 5' 6 (1.676 m)   Wt 218 lb (98.9 kg)   LMP 01/20/2017   SpO2 98%   BMI 35.19 kg/m    Physical Exam Feet:     Comments: Plantar wart right fifth base of toe.     Assessment & Plan:  Plantar wart of right foot Assessment & Plan: Pt gave verbal consent prior. Scalpel used to par wart prior to treatment with cryotherapy. Cryotherapy blast applied until white bead formation x 2 rounds. Advised pt to apply Aquaphor otherwise no other treatment needed. Educated will likely start to resolve within 4-6 days, may blister and or slough off however monitor for s/s infection. Pt tolerated procedure well  May need repeat rounds of cryotherapy pt knows to schedule  f/u if repeat session needed.  Handout given to pt as well.       Follow up plan: Return if symptoms worsen or fail to improve.  Ginger Patrick, FNP

## 2023-01-22 NOTE — Assessment & Plan Note (Signed)
  Pt gave verbal consent prior. Scalpel used to par wart prior to treatment with cryotherapy. Cryotherapy blast applied until white bead formation x 2 rounds. Advised pt to apply Aquaphor otherwise no other treatment needed. Educated will likely start to resolve within 4-6 days, may blister and or slough off however monitor for s/s infection. Pt tolerated procedure well  May need repeat rounds of cryotherapy pt knows to schedule f/u if repeat session needed.  Handout given to pt as well.

## 2023-02-18 ENCOUNTER — Encounter (INDEPENDENT_AMBULATORY_CARE_PROVIDER_SITE_OTHER): Payer: Self-pay | Admitting: Family Medicine

## 2023-02-18 ENCOUNTER — Ambulatory Visit (INDEPENDENT_AMBULATORY_CARE_PROVIDER_SITE_OTHER): Payer: 59 | Admitting: Family Medicine

## 2023-02-18 VITALS — BP 136/84 | HR 72 | Temp 98.0°F | Ht 66.0 in | Wt 209.0 lb

## 2023-02-18 DIAGNOSIS — R7303 Prediabetes: Secondary | ICD-10-CM

## 2023-02-18 DIAGNOSIS — E669 Obesity, unspecified: Secondary | ICD-10-CM

## 2023-02-18 DIAGNOSIS — Z6833 Body mass index (BMI) 33.0-33.9, adult: Secondary | ICD-10-CM

## 2023-02-18 MED ORDER — METFORMIN HCL 500 MG PO TABS: 500.0000 mg | ORAL_TABLET | Freq: Two times a day (BID) | ORAL | 0 refills | Status: DC

## 2023-02-18 MED ORDER — ZEPBOUND 5 MG/0.5ML ~~LOC~~ SOAJ: 5.0000 mg | SUBCUTANEOUS | 0 refills | Status: DC

## 2023-02-18 NOTE — Progress Notes (Signed)
 .smr  Office: [phone]  /  Fax: [phone]  WEIGHT SUMMARY AND BIOMETRICS  Anthropometric Measurements Height: 5' 6 (1.676 m) Weight: 209 lb (94.8 kg) BMI (Calculated): 33.75 Weight at Last Visit: 210 lb Weight Lost Since Last Visit: 1 lb Weight Gained Since Last Visit: 0 Starting Weight: 223 lb Total Weight Loss (lbs): 14 lb (6.35 kg)   Body Composition  Body Fat %: 42.9 % Fat Mass (lbs): 89.6 lbs Muscle Mass (lbs): 113.4 lbs Total Body Water (lbs): 85.4 lbs Visceral Fat Rating : 11   Other Clinical Data Fasting: No Labs: No Today's Visit #: 50 Starting Date: 08/30/19    Chief Complaint: OBESITY   History of Present Illness   Ruth Elliott is a 53 year old female with obesity and prediabetes who presents for management of these conditions.  She is actively managing her obesity through diet and exercise, having lost one pound in the last month by adhering to a category three eating plan approximately seventy percent of the time. Her exercise regimen includes walking and using a stepper for twenty-five minutes, five times a week. She is considering adding biking and light weights to her routine to build muscle, particularly in her core.  For her prediabetes, she is currently taking metformin  and Zepbound . She requests a refill for both medications, noting that her insurance covers only ninety-day supplies. She previously encountered an issue with insurance coverage for a sixty-day supply of metformin , which was not filled due to complications.  She reports improved energy levels with her current medication regimen, whereas prior to treatment, her energy levels were significantly lower, affecting her job performance. She is concerned about maintaining her energy levels to continue working effectively.  Regarding her sleep, she feels rested but also 'beat up' upon waking, attributing this to her mattress and pillow setup. She has recently changed her mattress pad  to improve comfort and reports sleeping well at a hotel recently.          PHYSICAL EXAM:  Blood pressure 136/84, pulse 72, temperature 98 F (36.7 C), height 5' 6 (1.676 m), weight 209 lb (94.8 kg), last menstrual period 01/20/2017, SpO2 98%. Body mass index is 33.73 kg/m.  DIAGNOSTIC DATA REVIEWED:  BMET    Component Value Date/Time   NA 141 07/31/2022 1437   K 4.2 07/31/2022 1437   CL 104 07/31/2022 1437   CO2 22 07/31/2022 1437   GLUCOSE 89 07/31/2022 1437   BUN 17 07/31/2022 1437   CREATININE 0.68 07/31/2022 1437   CALCIUM 9.0 07/31/2022 1437   GFRNONAA 105 02/14/2020 1204   GFRAA 121 02/14/2020 1204   Lab Results  Component Value Date   HGBA1C 6.0 (H) 07/31/2022   HGBA1C 5.4 08/31/2013   Lab Results  Component Value Date   INSULIN  16.7 07/31/2022   INSULIN  12.7 08/30/2019   Lab Results  Component Value Date   TSH 1.720 01/30/2022   CBC    Component Value Date/Time   WBC 9.2 01/30/2022 0849   RBC 4.29 01/30/2022 0849   HGB 14.1 01/30/2022 0849   HCT 42.0 01/30/2022 0849   PLT 180 01/30/2022 0849   MCV 98 (H) 01/30/2022 0849   MCH 32.9 01/30/2022 0849   MCHC 33.6 01/30/2022 0849   RDW 12.6 01/30/2022 0849   Iron Studies    Component Value Date/Time   IRON 172 (H) 01/30/2022 0849   TIBC 309 01/30/2022 0849   FERRITIN 239 (H) 01/30/2022 0849   IRONPCTSAT 56 (H) 01/30/2022 9150  Lipid Panel     Component Value Date/Time   CHOL 102 07/31/2022 1437   TRIG 151 (H) 07/31/2022 1437   HDL 32 (L) 07/31/2022 1437   CHOLHDL 2.5 08/09/2019 1047   LDLCALC 44 07/31/2022 1437   Hepatic Function Panel     Component Value Date/Time   PROT 6.6 07/31/2022 1437   ALBUMIN 4.3 07/31/2022 1437   AST 37 07/31/2022 1437   ALT 37 (H) 07/31/2022 1437   ALKPHOS 81 07/31/2022 1437   BILITOT 0.8 07/31/2022 1437   BILIDIR 0.13 09/13/2019 1117      Component Value Date/Time   TSH 1.720 01/30/2022 0849   Nutritional Lab Results  Component Value Date    VD25OH 39.0 07/31/2022   VD25OH 51.7 08/23/2021   VD25OH 48.5 03/01/2021     Assessment and Plan    Obesity Obesity with ongoing efforts in diet, exercise, and pharmacotherapy. She has lost one pound in the last month and is following a category three eating plan 70% of the time. She exercises by walking and using a stepper for 25 minutes five times per week. She is considering incorporating biking and light weights to improve muscle mass without bulkiness. - Refill Zepbound   Prediabetes Prediabetes managed with metformin  and Zepbound . She reports improved energy levels with the current medication regimen. Discussed the importance of maintaining blood glucose levels to avoid fluctuations causing fatigue and inflammation. Emphasized not going too long without eating and meeting protein goals. Explained that metformin  and Zepbound  help stabilize blood glucose levels, reducing fatigue and inflammation. Discussed the need for prior authorization for Zepbound  and the process involved. - Refill metformin  for 90 days - Continue current diet and exercise regimen - Monitor blood glucose levels - Contact healthcare provider if prior authorization issues arise  General Health Maintenance Actively managing health through diet, exercise, and medication adherence. Discussed the importance of sleep quality and its impact on metabolism. She is adjusting her sleep environment to improve restfulness. - Ensure adequate sleep - Continue current health maintenance practices  Follow-up - Schedule next month's appointment.       She was informed of the importance of frequent follow up visits to maximize her success with intensive lifestyle modifications for her multiple health conditions.    [name], MD

## 2023-02-19 ENCOUNTER — Ambulatory Visit: Payer: 59 | Admitting: Family

## 2023-03-17 ENCOUNTER — Encounter (INDEPENDENT_AMBULATORY_CARE_PROVIDER_SITE_OTHER): Payer: Self-pay | Admitting: Family Medicine

## 2023-03-17 ENCOUNTER — Ambulatory Visit (INDEPENDENT_AMBULATORY_CARE_PROVIDER_SITE_OTHER): Payer: 59 | Admitting: Family Medicine

## 2023-03-17 VITALS — BP 138/83 | HR 64 | Temp 98.9°F | Ht 66.0 in | Wt 206.0 lb

## 2023-03-17 DIAGNOSIS — E669 Obesity, unspecified: Secondary | ICD-10-CM

## 2023-03-17 DIAGNOSIS — I10 Essential (primary) hypertension: Secondary | ICD-10-CM | POA: Diagnosis not present

## 2023-03-17 DIAGNOSIS — E782 Mixed hyperlipidemia: Secondary | ICD-10-CM

## 2023-03-17 DIAGNOSIS — Z6833 Body mass index (BMI) 33.0-33.9, adult: Secondary | ICD-10-CM

## 2023-03-17 DIAGNOSIS — E559 Vitamin D deficiency, unspecified: Secondary | ICD-10-CM | POA: Diagnosis not present

## 2023-03-17 DIAGNOSIS — R7303 Prediabetes: Secondary | ICD-10-CM

## 2023-03-17 MED ORDER — ZEPBOUND 5 MG/0.5ML ~~LOC~~ SOAJ: 5.0000 mg | SUBCUTANEOUS | 0 refills | Status: DC

## 2023-03-17 NOTE — Progress Notes (Signed)
 Office: [phone]  /  Fax: [phone]  WEIGHT SUMMARY AND BIOMETRICS  Anthropometric Measurements Height: 5\' 6"  (1.676 m) Weight: 206 lb (93.4 kg) BMI (Calculated): 33.27 Weight at Last Visit: 209 lb Weight Lost Since Last Visit: 3 lb Weight Gained Since Last Visit: 0 Starting Weight: 223 lb Total Weight Loss (lbs): 17 lb (7.711 kg) Peak Weight: 240 lb   Body Composition  Body Fat %: 42.1 % Fat Mass (lbs): 86.8 lbs Muscle Mass (lbs): 113.6 lbs Total Body Water (lbs): 85.2 lbs Visceral Fat Rating : 11   Other Clinical Data Fasting: no Labs: no Today's Visit #: 51 Starting Date: 08/30/19    Chief Complaint: OBESITY    History of Present Illness   Ruth Elliott is a 53 year old female with obesity who presents for treatment plan and progress monitoring.  She is adhering to a category three eating plan approximately 80% of the time and has achieved a weight loss of three pounds over the past month. She engages in physical activity by walking for exercise for 20 minutes, three to four times per week. She is currently taking Zepbound 5 mg for obesity management and reports no significant side effects, although she experienced a minor stomach upset recently, which she attributes to excessive coffee consumption rather than the medication. 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  She has a history of prediabetes and polyphagia, which she is managing with diet, exercise, and Zepbound. No issues with constipation or stomach problems related to previous antibiotic use have been reported in the past four to five months. She is exploring different sweeteners and is cautious about using NutraSweet due to its potential effects on hunger and insulin response.  She has a history of mixed hyperlipidemia and is working on diet, exercise,  and weight loss to manage this condition. She is taking fish oil and flax oil as part of her treatment regimen. Recent lab results showed a slight increase in triglycerides, but her cholesterol levels have generally been good.  She has a history of hypertension and is currently on Diovan and amlodipine. Her blood pressure is stable at 138/83.  She has a history of vitamin D deficiency and is taking over-the-counter vitamin D at 1000 IU per day, along with a multivitamin that likely contains additional vitamin D. Her last vitamin D level was 39, which is below the desired goal.          PHYSICAL EXAM:  Blood pressure 138/83, pulse 64, temperature 98.9 F (37.2 C), height 5\' 6"  (1.676 m), weight 206 lb (93.4 kg), last menstrual period 01/20/2017, SpO2 100%. Body mass index is 33.25 kg/m.  DIAGNOSTIC DATA REVIEWED:  BMET    Component Value Date/Time   NA 141 07/31/2022 1437   K 4.2 07/31/2022 1437   CL 104 07/31/2022 1437   CO2 22 07/31/2022 1437   GLUCOSE 89 07/31/2022 1437   BUN 17 07/31/2022 1437   CREATININE 0.68 07/31/2022 1437   CALCIUM 9.0 07/31/2022 1437   GFRNONAA 105 02/14/2020 1204   GFRAA 121 02/14/2020 1204   Lab Results  Component Value Date   HGBA1C 6.0 (H) 07/31/2022   HGBA1C 5.4 08/31/2013   Lab Results  Component Value Date   INSULIN 16.7 07/31/2022   INSULIN 12.7 08/30/2019   Lab Results  Component Value Date   TSH  1.720 01/30/2022   CBC    Component Value Date/Time   WBC 9.2 01/30/2022 0849   RBC 4.29 01/30/2022 0849   HGB 14.1 01/30/2022 0849   HCT 42.0 01/30/2022 0849   PLT 180 01/30/2022 0849   MCV 98 (H) 01/30/2022 0849   MCH 32.9 01/30/2022 0849   MCHC 33.6 01/30/2022 0849   RDW 12.6 01/30/2022 0849   Iron Studies    Component Value Date/Time   IRON 172 (H) 01/30/2022 0849   TIBC 309 01/30/2022 0849   FERRITIN 239 (H) 01/30/2022 0849   IRONPCTSAT 56 (H) 01/30/2022 0849   Lipid Panel     Component Value Date/Time   CHOL 102  07/31/2022 1437   TRIG 151 (H) 07/31/2022 1437   HDL 32 (L) 07/31/2022 1437   CHOLHDL 2.5 08/09/2019 1047   LDLCALC 44 07/31/2022 1437   Hepatic Function Panel     Component Value Date/Time   PROT 6.6 07/31/2022 1437   ALBUMIN 4.3 07/31/2022 1437   AST 37 07/31/2022 1437   ALT 37 (H) 07/31/2022 1437   ALKPHOS 81 07/31/2022 1437   BILITOT 0.8 07/31/2022 1437   BILIDIR 0.13 09/13/2019 1117      Component Value Date/Time   TSH 1.720 01/30/2022 0849   Nutritional Lab Results  Component Value Date   VD25OH 39.0 07/31/2022   VD25OH 51.7 08/23/2021   VD25OH 48.5 03/01/2021     Assessment and Plan    Obesity Following a category three eating plan 80% of the time, resulting in a 3-pound weight loss over the past month. Engages in walking for exercise 20 minutes 3-4 times per week. No significant challenges or gastrointestinal side effects from Zepbound, though occasional irritation likely due to coffee consumption. No constipation reported. Discussed preauthorization process for Zepbound. - Continue category three eating plan - Continue walking for exercise 20 minutes 3-4 times per week - Renew Zepbound prescription - Monitor for preauthorization needs for Zepbound  Prediabetes Managing prediabetes and obesity with Zepbound. No significant side effects reported. Emphasized the importance of monitoring blood glucose levels and adhering to the current treatment plan. - Continue Zepbound - Monitor blood glucose levels - Continue diet, exercise, and weight loss efforts  Mixed Hyperlipidemia Managing condition with diet, exercise, weight loss, fish oil, and flax oil. Recent labs showed slightly elevated triglycerides, but overall cholesterol levels are generally good. - Continue fish oil and flax oil - Continue diet, exercise, and weight loss efforts - Recheck lipid panel at next appointment  Hypertension Blood pressure well-managed at 138/83 mmHg with Diovan and amlodipine.  Discussed the importance of continuing current medications and regular blood pressure monitoring. - Continue Diovan and amlodipine - Monitor blood pressure regularly - Continue diet, exercise, and weight loss efforts  Vitamin D Deficiency On over-the-counter vitamin D 1000 IU daily and a multivitamin. Last vitamin D level was 39, below the goal of 50-60. Discussed the importance of maintaining adequate vitamin D levels and rechecking levels at the next appointment. - Continue vitamin D 1000 IU daily - Recheck vitamin D level at next appointment  General Health Maintenance Working on meal planning and prepping despite long work hours. Discussed various dietary options and sweeteners. Encouraged to use Splenda or Truvia over NutraSweet to avoid insulin response and increased hunger. - Continue current meal planning and prepping strategies - Use Splenda or Truvia as sweeteners  Follow-up - Follow up on March 31st at 7:20 AM - Perform fasting labs at next appointment.  She was informed of the importance of frequent follow up visits to maximize her success with intensive lifestyle modifications for her multiple health conditions.    Quillian Quince, MD

## 2023-03-18 ENCOUNTER — Encounter (INDEPENDENT_AMBULATORY_CARE_PROVIDER_SITE_OTHER): Payer: Self-pay | Admitting: Family Medicine

## 2023-03-19 ENCOUNTER — Telehealth (INDEPENDENT_AMBULATORY_CARE_PROVIDER_SITE_OTHER): Payer: Self-pay

## 2023-03-19 NOTE — Telephone Encounter (Signed)
 Prior Berkley Harvey has been submitted for Zepbound.  Awaiting determination.

## 2023-03-19 NOTE — Telephone Encounter (Signed)
 Outcome Approved today by [employer] 2017 NCPDP Request Reference Number: [id number]. ZEPBOUND INJ 5/0.5ML is approved through 09/19/2023. Your patient may now fill this prescription and it will be covered. Effective Date: 03/19/2023 Authorization Expiration Date: 09/19/2023

## 2023-03-20 ENCOUNTER — Other Ambulatory Visit (INDEPENDENT_AMBULATORY_CARE_PROVIDER_SITE_OTHER): Payer: Self-pay | Admitting: Family Medicine

## 2023-03-20 DIAGNOSIS — R7303 Prediabetes: Secondary | ICD-10-CM

## 2023-04-14 ENCOUNTER — Encounter (INDEPENDENT_AMBULATORY_CARE_PROVIDER_SITE_OTHER): Payer: Self-pay | Admitting: Family Medicine

## 2023-04-14 ENCOUNTER — Ambulatory Visit (INDEPENDENT_AMBULATORY_CARE_PROVIDER_SITE_OTHER): Payer: 59 | Admitting: Family Medicine

## 2023-04-14 VITALS — BP 119/79 | HR 70 | Temp 97.9°F | Ht 66.0 in | Wt 205.0 lb

## 2023-04-14 DIAGNOSIS — R7303 Prediabetes: Secondary | ICD-10-CM

## 2023-04-14 DIAGNOSIS — I10 Essential (primary) hypertension: Secondary | ICD-10-CM | POA: Diagnosis not present

## 2023-04-14 DIAGNOSIS — E559 Vitamin D deficiency, unspecified: Secondary | ICD-10-CM

## 2023-04-14 DIAGNOSIS — R632 Polyphagia: Secondary | ICD-10-CM

## 2023-04-14 DIAGNOSIS — E890 Postprocedural hypothyroidism: Secondary | ICD-10-CM

## 2023-04-14 DIAGNOSIS — Z6833 Body mass index (BMI) 33.0-33.9, adult: Secondary | ICD-10-CM

## 2023-04-14 DIAGNOSIS — E785 Hyperlipidemia, unspecified: Secondary | ICD-10-CM

## 2023-04-14 DIAGNOSIS — E782 Mixed hyperlipidemia: Secondary | ICD-10-CM

## 2023-04-14 DIAGNOSIS — R7989 Other specified abnormal findings of blood chemistry: Secondary | ICD-10-CM

## 2023-04-14 DIAGNOSIS — E669 Obesity, unspecified: Secondary | ICD-10-CM

## 2023-04-14 MED ORDER — ZEPBOUND 5 MG/0.5ML ~~LOC~~ SOAJ: 5.0000 mg | SUBCUTANEOUS | 0 refills | Status: DC

## 2023-04-14 NOTE — Progress Notes (Signed)
 Office: [phone]  /  Fax: [phone]  WEIGHT SUMMARY AND BIOMETRICS  Anthropometric Measurements Height: 5\' 6"  (1.676 m) Weight: 205 lb (93 kg) BMI (Calculated): 33.1 Weight at Last Visit: 206 lb Weight Lost Since Last Visit: 1 lb Weight Gained Since Last Visit: 0 Starting Weight: 223 lb Total Weight Loss (lbs): 18 lb (8.165 kg) Peak Weight: 240 lb   Body Composition  Body Fat %: 42 % Fat Mass (lbs): 86.2 lbs Muscle Mass (lbs): 113 lbs Total Body Water (lbs): 85.8 lbs Visceral Fat Rating : 11   Other Clinical Data Fasting: Yes Labs: Yes Today's Visit #: 65 Starting Date: 08/30/19    Chief Complaint: OBESITY   History of Present Illness   Ruth Elliott is a 53 year old female with obesity, hyperlipidemia, prediabetes, and vitamin D deficiency who presents for obesity treatment and lab assessment.  She is adhering to her category three eating plan 80% of the time and engages in physical activities such as walking and horse riding for 20 to 30 minutes, five times per week. She has experienced a weight loss of one pound over the past month.  She is managing her hyperlipidemia through dietary modifications and physical activity.  She is focusing on weight loss and dietary management to address her prediabetes and is due for laboratory assessments to evaluate her glucose levels.  She has a history of vitamin D deficiency and is due for laboratory assessments to monitor this condition.  Her most recent liver enzyme tests showed mildly elevated levels, with an ALT of 37. She is working on weight loss to address these elevated liver function tests and is due for further laboratory assessments.  She has a history of hypertension and is currently being treated with valsartan and amlodipine. She is managing her hypertension with dietary modifications and weight loss.  She has a history of polyphagia, which is being managed with Zepbound at a dose of 5 mg  weekly.  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          PHYSICAL EXAM:  Blood pressure 119/79, pulse 70, temperature 97.9 F (36.6 C), height 5\' 6"  (1.676 m), weight 205 lb (93 kg), last menstrual period 01/20/2017, SpO2 100%. Body mass index is 33.09 kg/m.  DIAGNOSTIC DATA REVIEWED:  BMET    Component Value Date/Time   NA 141 07/31/2022 1437   K 4.2 07/31/2022 1437   CL 104 07/31/2022 1437   CO2 22 07/31/2022 1437   GLUCOSE 89 07/31/2022 1437   BUN 17 07/31/2022 1437   CREATININE 0.68 07/31/2022 1437   CALCIUM 9.0 07/31/2022 1437   GFRNONAA 105 02/14/2020 1204   GFRAA 121 02/14/2020 1204   Lab Results  Component Value Date   HGBA1C 6.0 (H) 07/31/2022   HGBA1C 5.4 08/31/2013   Lab Results  Component Value Date   INSULIN 16.7 07/31/2022   INSULIN 12.7 08/30/2019   Lab Results  Component Value Date   TSH 1.720 01/30/2022   CBC    Component Value Date/Time   WBC 9.2 01/30/2022 0849   RBC 4.29 01/30/2022 0849   HGB 14.1 01/30/2022 0849   HCT 42.0 01/30/2022 0849   PLT 180 01/30/2022 0849   MCV 98 (H) 01/30/2022 0849   MCH 32.9 01/30/2022 0849   MCHC 33.6 01/30/2022 0849   RDW 12.6 01/30/2022 0849   Iron Studies  Component Value Date/Time   IRON 172 (H) 01/30/2022 0849   TIBC 309 01/30/2022 0849   FERRITIN 239 (H) 01/30/2022 0849   IRONPCTSAT 56 (H) 01/30/2022 0849   Lipid Panel     Component Value Date/Time   CHOL 102 07/31/2022 1437   TRIG 151 (H) 07/31/2022 1437   HDL 32 (L) 07/31/2022 1437   CHOLHDL 2.5 08/09/2019 1047   LDLCALC 44 07/31/2022 1437   Hepatic Function Panel     Component Value Date/Time   PROT 6.6 07/31/2022 1437   ALBUMIN 4.3 07/31/2022 1437   AST 37 07/31/2022 1437   ALT 37 (H) 07/31/2022 1437   ALKPHOS 81 07/31/2022 1437   BILITOT 0.8 07/31/2022 1437   BILIDIR 0.13 09/13/2019 1117      Component Value  Date/Time   TSH 1.720 01/30/2022 0849   Nutritional Lab Results  Component Value Date   VD25OH 39.0 07/31/2022   VD25OH 51.7 08/23/2021   VD25OH 48.5 03/01/2021     Assessment and Plan    Obesity She adheres to a category three eating plan 80% of the time and engages in physical activities such as walking and horse riding for 20-30 minutes, five times per week. She has lost one pound in the last month. The focus is on diet and exercise for weight management. - Continue category three eating plan - Continue physical activities such as walking and horse riding  Prediabetes She manages prediabetes with diet and exercise. Labs are due to assess glucose levels. - Order fasting glucose and HbA1c - Continue diet and exercise regimen  Hyperlipidemia She manages hyperlipidemia through diet and exercise. Labs are due to assess cholesterol levels. - Order lipid panel - Continue diet and exercise regimen  Hypertension Hypertension is well-controlled with valsartan and amlodipine. Current blood pressure is 119/79. - Continue valsartan and amlodipine - Monitor blood pressure regularly  Polyphagia Polyphagia is managed with Zepbound 5 mg weekly. 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 She requests thyroid function tests after discharge from endocrinology care. - Order TSH, T3, and T4 tests  Elevated Liver Enzymes Previous mild elevation in liver enzymes with ALT of 37. Labs are due to reassess liver function. - Order liver function tests  Vitamin D Deficiency She has vitamin D deficiency. Labs are due to assess vitamin D levels. - Order vitamin D level test  General Health Maintenance She actively manages her health through diet, exercise, and regular monitoring of medical  conditions. She prepares her own snack packs to control her diet. - Encourage continued healthy eating habits and exercise - Discuss the importance of balanced meals and protein intake  Follow-up Follow-up appointment is scheduled. - Confirm follow-up appointment on May 12, 2023, at 7:20 AM - Schedule additional follow-up appointments as needed        She was informed of the importance of frequent follow up visits to maximize her success with intensive lifestyle modifications for her multiple health conditions.    Quillian Quince, MD

## 2023-04-15 LAB — CMP14+EGFR
ALT: 33 IU/L — ABNORMAL HIGH (ref 0–32)
AST: 32 IU/L (ref 0–40)
Albumin: 4.4 g/dL (ref 3.8–4.9)
Alkaline Phosphatase: 96 IU/L (ref 44–121)
BUN/Creatinine Ratio: 16 (ref 9–23)
BUN: 13 mg/dL (ref 6–24)
Bilirubin Total: 0.7 mg/dL (ref 0.0–1.2)
CO2: 23 mmol/L (ref 20–29)
Calcium: 9.7 mg/dL (ref 8.7–10.2)
Chloride: 103 mmol/L (ref 96–106)
Creatinine, Ser: 0.79 mg/dL (ref 0.57–1.00)
Globulin, Total: 2.6 g/dL (ref 1.5–4.5)
Glucose: 85 mg/dL (ref 70–99)
Potassium: 4.8 mmol/L (ref 3.5–5.2)
Sodium: 141 mmol/L (ref 134–144)
Total Protein: 7 g/dL (ref 6.0–8.5)
eGFR: 89 mL/min/{1.73_m2} (ref >59)

## 2023-04-15 LAB — LIPID PANEL WITH LDL/HDL RATIO
Cholesterol, Total: 109 mg/dL (ref 100–199)
HDL: 41 mg/dL (ref >39)
LDL Chol Calc (NIH): 52 mg/dL (ref 0–99)
LDL/HDL Ratio: 1.3 ratio (ref 0.0–3.2)
Triglycerides: 80 mg/dL (ref 0–149)
VLDL Cholesterol Cal: 16 mg/dL (ref 5–40)

## 2023-04-15 LAB — TSH: TSH: 1.76 u[IU]/mL (ref 0.450–4.500)

## 2023-04-15 LAB — VITAMIN B12: Vitamin B-12: 1357 pg/mL — ABNORMAL HIGH (ref 232–1245)

## 2023-04-15 LAB — T4, FREE: Free T4: 1.1 ng/dL (ref 0.82–1.77)

## 2023-04-15 LAB — HEMOGLOBIN A1C
Est. average glucose Bld gHb Est-mCnc: 108 mg/dL
Hgb A1c MFr Bld: 5.4 % (ref 4.8–5.6)

## 2023-04-15 LAB — T3: T3, Total: 124 ng/dL (ref 71–180)

## 2023-04-15 LAB — VITAMIN D 25 HYDROXY (VIT D DEFICIENCY, FRACTURES): Vit D, 25-Hydroxy: 78.3 ng/mL (ref 30.0–100.0)

## 2023-04-15 LAB — INSULIN, RANDOM: INSULIN: 14.7 u[IU]/mL (ref 2.6–24.9)

## 2023-05-12 ENCOUNTER — Encounter (INDEPENDENT_AMBULATORY_CARE_PROVIDER_SITE_OTHER): Payer: Self-pay | Admitting: Family Medicine

## 2023-05-12 ENCOUNTER — Ambulatory Visit (INDEPENDENT_AMBULATORY_CARE_PROVIDER_SITE_OTHER): Admitting: Family Medicine

## 2023-05-12 VITALS — BP 125/75 | HR 74 | Temp 98.5°F | Ht 66.0 in | Wt 208.0 lb

## 2023-05-12 DIAGNOSIS — I10 Essential (primary) hypertension: Secondary | ICD-10-CM | POA: Diagnosis not present

## 2023-05-12 DIAGNOSIS — R632 Polyphagia: Secondary | ICD-10-CM

## 2023-05-12 DIAGNOSIS — E559 Vitamin D deficiency, unspecified: Secondary | ICD-10-CM | POA: Diagnosis not present

## 2023-05-12 DIAGNOSIS — E669 Obesity, unspecified: Secondary | ICD-10-CM

## 2023-05-12 DIAGNOSIS — R7303 Prediabetes: Secondary | ICD-10-CM | POA: Diagnosis not present

## 2023-05-12 DIAGNOSIS — Z6833 Body mass index (BMI) 33.0-33.9, adult: Secondary | ICD-10-CM

## 2023-05-12 MED ORDER — ZEPBOUND 5 MG/0.5ML ~~LOC~~ SOAJ: 5.0000 mg | SUBCUTANEOUS | 0 refills | Status: DC

## 2023-05-12 NOTE — Progress Notes (Signed)
 Office: [phone]  /  Fax: [phone]  WEIGHT SUMMARY AND BIOMETRICS  Anthropometric Measurements Height: 5\' 6"  (1.676 m) Weight: 208 lb (94.3 kg) BMI (Calculated): 33.59 Weight at Last Visit: 205lb Weight Lost Since Last Visit: 0 Weight Gained Since Last Visit: 3lb Starting Weight: 223lb Total Weight Loss (lbs): 15 lb (6.804 kg) Peak Weight: 240lb   Body Composition  Body Fat %: 41.9 % Fat Mass (lbs): 87.2 lbs Muscle Mass (lbs): 114.6 lbs Total Body Water (lbs): 85.8 lbs Visceral Fat Rating : 11   Other Clinical Data Fasting: no Labs: no Today's Visit #: 29 Starting Date: 08/30/19    Chief Complaint: OBESITY   History of Present Illness Ruth Elliott is a 53 year old female who presents for obesity treatment and progress assessment.  She is adhering to a category three eating plan approximately 80% of the time. Despite increased physical activity, including walking and horseback riding four to five times per week for 20 to 30 minutes each session, she has gained three pounds since her last visit. She does not feel hungry and sometimes skips meals due to her busy work schedule, which includes long hours. She is considering incorporating microwave meals to avoid skipping meals.  She has a history of hypertension and is currently taking amlodipine  and valsartan . Her blood pressure is well controlled at 125/75 mmHg. She is working on diet and exercise to improve her hypertension.  She is managing prediabetes and is on metformin . She is working on decreasing simple carbohydrates in her diet. She sometimes misses doses of metformin  due to irregular meal patterns.  She is currently taking Xeljanz for her obesity and prediabetes and requests a refill. Her hunger is well controlled and she is not experiencing nausea.  She mentions a lack of sleep, averaging five to six hours per night, which she attributes to her demanding work schedule. She does not feel tired  despite the reduced sleep and is concerned about the potential long-term effects of sleep deprivation.      PHYSICAL EXAM:  Blood pressure 125/75, pulse 74, temperature 98.5 F (36.9 C), height 5\' 6"  (1.676 m), weight 208 lb (94.3 kg), last menstrual period 01/20/2017, SpO2 100%. Body mass index is 33.57 kg/m.  DIAGNOSTIC DATA REVIEWED:  BMET    Component Value Date/Time   NA 141 04/14/2023 0754   K 4.8 04/14/2023 0754   CL 103 04/14/2023 0754   CO2 23 04/14/2023 0754   GLUCOSE 85 04/14/2023 0754   BUN 13 04/14/2023 0754   CREATININE 0.79 04/14/2023 0754   CALCIUM 9.7 04/14/2023 0754   GFRNONAA 105 02/14/2020 1204   GFRAA 121 02/14/2020 1204   Lab Results  Component Value Date   HGBA1C 5.4 04/14/2023   HGBA1C 5.4 08/31/2013   Lab Results  Component Value Date   INSULIN  14.7 04/14/2023   INSULIN  12.7 08/30/2019   Lab Results  Component Value Date   TSH 1.760 04/14/2023   CBC    Component Value Date/Time   WBC 9.2 01/30/2022 0849   RBC 4.29 01/30/2022 0849   HGB 14.1 01/30/2022 0849   HCT 42.0 01/30/2022 0849   PLT 180 01/30/2022 0849   MCV 98 (H) 01/30/2022 0849   MCH 32.9 01/30/2022 0849   MCHC 33.6 01/30/2022 0849   RDW 12.6 01/30/2022 0849   Iron Studies    Component Value Date/Time   IRON 172 (H) 01/30/2022 0849   TIBC 309 01/30/2022 0849   FERRITIN 239 (H) 01/30/2022 2956  IRONPCTSAT 56 (H) 01/30/2022 0849   Lipid Panel     Component Value Date/Time   CHOL 109 04/14/2023 0754   TRIG 80 04/14/2023 0754   HDL 41 04/14/2023 0754   CHOLHDL 2.5 08/09/2019 1047   LDLCALC 52 04/14/2023 0754   Hepatic Function Panel     Component Value Date/Time   PROT 7.0 04/14/2023 0754   ALBUMIN 4.4 04/14/2023 0754   AST 32 04/14/2023 0754   ALT 33 (H) 04/14/2023 0754   ALKPHOS 96 04/14/2023 0754   BILITOT 0.7 04/14/2023 0754   BILIDIR 0.13 09/13/2019 1117      Component Value Date/Time   TSH 1.760 04/14/2023 0754   Nutritional Lab Results   Component Value Date   VD25OH 78.3 04/14/2023   VD25OH 39.0 07/31/2022   VD25OH 51.7 08/23/2021     Assessment and Plan Assessment & Plan Obesity and Polyphagia Obesity management is ongoing with a category three eating plan at 80% adherence and physical activities such as walking and horseback riding 4-5 times per week. Weight has increased by 3 pounds since the last visit. Discussed the importance of not skipping meals to prevent metabolic slowdown and suggested microwave meals for convenience. Current medication Zepbound  is effective in controlling hunger without causing nausea. Discussed the potential impact of inadequate sleep on weight management and overall health. - Continue Zepbound  5 mg weekly. - Encourage adherence to the eating plan and regular physical activity. - Consider microwave meals to prevent skipping meals. - Address sleep hygiene to improve overall health.  Prediabetes Prediabetes is managed with metformin  and dietary modifications. Hemoglobin A1c has improved to 5.4 from 6.0. Discussed the timing of metformin  intake in relation to meals, noting that taking it without food may increase the risk of stomach upset but is otherwise not harmful. - Continue metformin  as prescribed. - Encourage dietary modifications to reduce simple carbohydrates.  Hypertension Hypertension is well-controlled with current medication regimen. Blood pressure is 125/75 mmHg. - Continue current medications: amlodipine  and valsartan .  Vitamin D  deficiency Vitamin D  levels have significantly increased, likely due to a higher dosage of supplementation. Current supplementation is 5000 IU, which is higher than necessary. - Reduce vitamin D  supplementation to 5000 IU twice a week.     She was informed of the importance of frequent follow up visits to maximize her success with intensive lifestyle modifications for her multiple health conditions.    Jasmine Mesi, MD

## 2023-05-22 ENCOUNTER — Ambulatory Visit: Payer: Managed Care, Other (non HMO) | Admitting: Dermatology

## 2023-06-02 ENCOUNTER — Ambulatory Visit: Admitting: Dermatology

## 2023-06-02 DIAGNOSIS — I781 Nevus, non-neoplastic: Secondary | ICD-10-CM | POA: Diagnosis not present

## 2023-06-02 DIAGNOSIS — L578 Other skin changes due to chronic exposure to nonionizing radiation: Secondary | ICD-10-CM

## 2023-06-02 DIAGNOSIS — Z7189 Other specified counseling: Secondary | ICD-10-CM

## 2023-06-02 DIAGNOSIS — L719 Rosacea, unspecified: Secondary | ICD-10-CM | POA: Diagnosis not present

## 2023-06-02 DIAGNOSIS — D1801 Hemangioma of skin and subcutaneous tissue: Secondary | ICD-10-CM

## 2023-06-02 DIAGNOSIS — W908XXA Exposure to other nonionizing radiation, initial encounter: Secondary | ICD-10-CM

## 2023-06-02 NOTE — Progress Notes (Signed)
 Follow-Up Visit   Subjective  Ruth Elliott is a 53 y.o. female who presents for the following: spot at right side of face that comes and goes. Noticed for several months.   The patient has spots, moles and lesions to be evaluated, some may be new or changing and the patient may have concern these could be cancer.  The following portions of the chart were reviewed this encounter and updated as appropriate: medications, allergies, medical history  Review of Systems:  No other skin or systemic complaints except as noted in HPI or Assessment and Plan.  Objective  Well appearing patient in no apparent distress; mood and affect are within normal limits.  A focused examination was performed of the following areas: Face   Relevant exam findings are noted in the Assessment and Plan.            Assessment & Plan   HEMANGIOMA Exam: red papule(s) at Leftt lower eyelid Discussed benign nature. Recommend observation. Call for changes. BBL laser discussed as treatment option.  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  Blanches to dermoscopy. No texture change of skin in telangiectasia area and no induration. Dr Annette Barters evaluated pt with me and agrees. Chronic and persistent condition with duration or expected duration over one year. Condition is symptomatic/ bothersome to patient. Not currently at goal. Rosacea is a chronic progressive skin condition usually affecting the face of adults, causing redness and/or acne bumps. It is treatable but not curable. It sometimes affects the eyes (ocular rosacea) as well. It may respond to topical and/or systemic medication and can flare with stress, sun exposure, alcohol, exercise, topical steroids (including  hydrocortisone/cortisone 10) and some foods.  Daily application of broad spectrum spf 30+ sunscreen to face is recommended to reduce flares.  Patient denies grittiness of the eyes   Treatment Plan Blanches by dermascopy looked at by both I and Dr. Annette Barters  Both agree that not cancerous, benign Recommend if scabs or forms bump to have checked No thickening of skin  Photos today   Discussed  Counseling for BBL / IPL / Laser and Coordination of Care Discussed the treatment option of Broad Band Light (BBL) Barron Lien Pulsed Light (IPL)/ Laser for skin discoloration, including brown spots and redness.  Typically we recommend at least 1-3 treatment sessions about 5-8 weeks apart for best results.  Cannot have tanned skin when BBL performed, and regular use of sunscreen/photoprotection is advised after the procedure to help maintain results. The patient's condition may also require "maintenance treatments" in the future.  The fee for BBL / laser treatments is $350 per treatment session for the whole face.  A fee can be quoted for other parts of the body.  Insurance typically does not pay for BBL/laser treatments and therefore the fee is an out-of-pocket cost. Recommend prophylactic valtrex  treatment. Once scheduled for procedure, will send Rx in prior to patient's appointment.   ACTINIC DAMAGE - chronic, secondary to cumulative UV radiation exposure/sun exposure over time - diffuse scaly erythematous macules with underlying dyspigmentation - Recommend daily broad spectrum sunscreen SPF 30+ to sun-exposed areas, reapply every 2 hours as needed.  - Recommend staying in the shade or wearing long sleeves, sun glasses (UVA+UVB protection) and wide brim hats (4-inch brim around the entire circumference of the hat). - Call for  new or changing lesions.   No follow-ups on file.  IRandee Busing, CMA, am acting as scribe for Celine Collard, MD.   Documentation: I have reviewed the above documentation for  accuracy and completeness, and I agree with the above.  Celine Collard, MD

## 2023-06-02 NOTE — Patient Instructions (Signed)

## 2023-06-03 ENCOUNTER — Encounter: Payer: Self-pay | Admitting: Dermatology

## 2023-06-07 ENCOUNTER — Other Ambulatory Visit (INDEPENDENT_AMBULATORY_CARE_PROVIDER_SITE_OTHER): Payer: Self-pay | Admitting: Family Medicine

## 2023-06-07 DIAGNOSIS — R632 Polyphagia: Secondary | ICD-10-CM

## 2023-06-07 DIAGNOSIS — R7303 Prediabetes: Secondary | ICD-10-CM

## 2023-06-10 ENCOUNTER — Ambulatory Visit (INDEPENDENT_AMBULATORY_CARE_PROVIDER_SITE_OTHER): Admitting: Family Medicine

## 2023-06-10 ENCOUNTER — Encounter (INDEPENDENT_AMBULATORY_CARE_PROVIDER_SITE_OTHER): Payer: Self-pay | Admitting: Family Medicine

## 2023-06-10 VITALS — BP 123/79 | HR 66 | Temp 98.3°F | Ht 66.0 in | Wt 208.0 lb

## 2023-06-10 DIAGNOSIS — Z6833 Body mass index (BMI) 33.0-33.9, adult: Secondary | ICD-10-CM

## 2023-06-10 DIAGNOSIS — R632 Polyphagia: Secondary | ICD-10-CM

## 2023-06-10 DIAGNOSIS — E669 Obesity, unspecified: Secondary | ICD-10-CM

## 2023-06-10 DIAGNOSIS — R7303 Prediabetes: Secondary | ICD-10-CM | POA: Diagnosis not present

## 2023-06-10 MED ORDER — METFORMIN HCL 500 MG PO TABS: 500.0000 mg | ORAL_TABLET | Freq: Two times a day (BID) | ORAL | 0 refills | Status: DC

## 2023-06-10 MED ORDER — ZEPBOUND 5 MG/0.5ML ~~LOC~~ SOAJ: 5.0000 mg | SUBCUTANEOUS | 0 refills | Status: DC

## 2023-06-10 NOTE — Progress Notes (Signed)
 Office: [phone]  /  Fax: [phone]  WEIGHT SUMMARY AND BIOMETRICS  Anthropometric Measurements Height: 5\' 6"  (1.676 m) Weight: 208 lb (94.3 kg) BMI (Calculated): 33.59 Weight at Last Visit: 208 lb Weight Lost Since Last Visit: 0 Weight Gained Since Last Visit: 0 Starting Weight: 223 lb Total Weight Loss (lbs): 15 lb (6.804 kg) Peak Weight: 240 lb   Body Composition  Body Fat %: 43 % Fat Mass (lbs): 89.6 lbs Muscle Mass (lbs): 112.8 lbs Total Body Water (lbs): 88 lbs Visceral Fat Rating : 11   Other Clinical Data Fasting: no Labs: no Today's Visit #: 54 Starting Date: 08/30/19    Chief Complaint: OBESITY   History of Present Illness Ruth Elliott is a 53 year old female who presents for a follow-up on her obesity treatment plan.  She has been adhering to the category three eating plan about fifty percent of the time and has maintained her weight over the last month. Her eating schedule is often disrupted due to work commitments, leading to late meals. To address this, she plans to prepare meals in advance, such as sandwiches for breakfast, and ensure she eats before leaving work. She has been preparing meals like shredded chicken and pork ribs for convenience.  She has recently increased her physical activity, incorporating riding her NordicTrack bike, walking, and horseback riding. She uses the NordicTrack bike a couple of times a week, with sessions including a warm-up, a 15-minute ride, and a cool-down. She rode her horse four times last week, but rain has limited her ability to continue.  She is currently taking Zepbound  5 mg weekly and metformin , although she sometimes misses doses of metformin  due to her irregular eating schedule. She is sensitive to salt, noting that she can taste salt in foods like mozzarella cheese and cottage cheese. She experienced some fluid retention recently, which she attributes to a meal with brussels sprouts and a peppery  steak.  Her work schedule is demanding, often requiring 12-hour days, which impacts her ability to maintain a regular eating and exercise routine. She is trying to manage her stress and workload better to accommodate her health goals.      PHYSICAL EXAM:  Blood pressure 123/79, pulse 66, temperature 98.3 F (36.8 C), height 5\' 6"  (1.676 m), weight 208 lb (94.3 kg), last menstrual period 01/20/2017, SpO2 100%. Body mass index is 33.57 kg/m.  DIAGNOSTIC DATA REVIEWED:  BMET    Component Value Date/Time   NA 141 04/14/2023 0754   K 4.8 04/14/2023 0754   CL 103 04/14/2023 0754   CO2 23 04/14/2023 0754   GLUCOSE 85 04/14/2023 0754   BUN 13 04/14/2023 0754   CREATININE 0.79 04/14/2023 0754   CALCIUM 9.7 04/14/2023 0754   GFRNONAA 105 02/14/2020 1204   GFRAA 121 02/14/2020 1204   Lab Results  Component Value Date   HGBA1C 5.4 04/14/2023   HGBA1C 5.4 08/31/2013   Lab Results  Component Value Date   INSULIN  14.7 04/14/2023   INSULIN  12.7 08/30/2019   Lab Results  Component Value Date   TSH 1.760 04/14/2023   CBC    Component Value Date/Time   WBC 9.2 01/30/2022 0849   RBC 4.29 01/30/2022 0849   HGB 14.1 01/30/2022 0849   HCT 42.0 01/30/2022 0849   PLT 180 01/30/2022 0849   MCV 98 (H) 01/30/2022 0849   MCH 32.9 01/30/2022 0849   MCHC 33.6 01/30/2022 0849   RDW 12.6 01/30/2022 0849   Iron Studies  Component Value Date/Time   IRON 172 (H) 01/30/2022 0849   TIBC 309 01/30/2022 0849   FERRITIN 239 (H) 01/30/2022 0849   IRONPCTSAT 56 (H) 01/30/2022 0849   Lipid Panel     Component Value Date/Time   CHOL 109 04/14/2023 0754   TRIG 80 04/14/2023 0754   HDL 41 04/14/2023 0754   CHOLHDL 2.5 08/09/2019 1047   LDLCALC 52 04/14/2023 0754   Hepatic Function Panel     Component Value Date/Time   PROT 7.0 04/14/2023 0754   ALBUMIN 4.4 04/14/2023 0754   AST 32 04/14/2023 0754   ALT 33 (H) 04/14/2023 0754   ALKPHOS 96 04/14/2023 0754   BILITOT 0.7 04/14/2023  0754   BILIDIR 0.13 09/13/2019 1117      Component Value Date/Time   TSH 1.760 04/14/2023 0754   Nutritional Lab Results  Component Value Date   VD25OH 78.3 04/14/2023   VD25OH 39.0 07/31/2022   VD25OH 51.7 08/23/2021     Assessment and Plan Assessment & Plan Obesity Obesity management focuses on lifestyle modifications. She has maintained her weight over the past month, adhering to the category three eating plan 50% of the time, and engaging in exercise such as NordicTrack biking, walking, and horseback riding. Emphasized the importance of regular meal intake and planning to prevent meal skipping due to her work schedule. Hydration is encouraged to manage fluid retention, possibly related to dietary sodium intake. - Continue category three eating plan. - Encourage regular exercise, including NordicTrack biking and horseback riding. - Promote regular meal intake and planning. - Encourage hydration to manage fluid retention.  Polyphagia Polyphagia is managed with Zepbound  5 mg weekly. She does not feel the need to increase the dose. Emphasized maintaining regular meal times despite work schedule challenges. - Continue Zepbound  5 mg weekly. - Encourage regular meal times despite work schedule challenges.  Prediabetes Prediabetes management includes lifestyle modifications and metformin . A1c has improved. She reports difficulty taking metformin  twice daily due to irregular meal times. Emphasized the importance of regular meal intake to facilitate medication adherence. - Continue metformin . - Encourage regular meal intake to facilitate medication adherence.      She was informed of the importance of frequent follow up visits to maximize her success with intensive lifestyle modifications for her multiple health conditions.    Jasmine Mesi, MD

## 2023-07-03 ENCOUNTER — Ambulatory Visit: Admitting: Family

## 2023-07-03 VITALS — BP 130/70 | HR 75 | Temp 98.0°F | Resp 16 | Ht 66.0 in | Wt 214.4 lb

## 2023-07-03 DIAGNOSIS — B070 Plantar wart: Secondary | ICD-10-CM

## 2023-07-03 NOTE — Progress Notes (Signed)
 Established Patient Office Visit  Subjective:      CC:  Chief Complaint  Patient presents with   sore foot    HPI: Ruth Elliott is a 53 y.o. female presenting on 07/03/2023 for sore foot   About one week ago started to notice a spot on her foot at the pad area that at times will hurt not always with weight bearing. If she hits it on something it will hurt not if at rest and hasn't been touched. No recent infection no trauma to the foot.   Plantar wart right foot back in January, cryotherapy really helped and has resolved.      Wt Readings from Last 3 Encounters:  07/14/23 212 lb (96.2 kg)  07/08/23 206 lb (93.4 kg)  07/03/23 214 lb 6.4 oz (97.3 kg)         Social history:  Relevant past medical, surgical, family and social history reviewed and updated as indicated. Interim medical history since our last visit reviewed.  Allergies and medications reviewed and updated.  DATA REVIEWED: CHART IN EPIC     ROS: Negative unless specifically indicated above in HPI.    Current Outpatient Medications:    amLODipine  (NORVASC ) 5 MG tablet, TAKE 1 TABLET(5 MG) BY MOUTH DAILY, Disp: 90 tablet, Rfl: 4   B Complex-C-Folic Acid (HM SUPER VITAMIN B COMPLEX/C PO), Take by mouth., Disp: , Rfl:    cephALEXin (KEFLEX) 500 MG capsule, Take 1 capsule (500 mg total) by mouth 3 (three) times daily for 7 days., Disp: 21 capsule, Rfl: 0   cetirizine (ZYRTEC) 10 MG chewable tablet, Chew 10 mg by mouth daily., Disp: , Rfl:    cholecalciferol (VITAMIN D3) 25 MCG (1000 UNIT) tablet, Take 1,000 Units by mouth daily., Disp: , Rfl:    Flax Oil-Fish Oil-Borage Oil (FISH OIL-FLAX OIL-BORAGE OIL) CAPS, Take 2 capsules by mouth daily. , Disp: , Rfl:    fluticasone  (FLONASE ) 50 MCG/ACT nasal spray, SHAKE LIQUID AND USE 2 SPRAYS IN EACH NOSTRIL DAILY, Disp: 16 g, Rfl: 1   ibuprofen (ADVIL) 400 MG tablet, Take 400 mg by mouth every 6 (six) hours as needed., Disp: , Rfl:    metFORMIN   (GLUCOPHAGE ) 500 MG tablet, Take 1 tablet (500 mg total) by mouth 2 (two) times daily with a meal., Disp: 180 tablet, Rfl: 0   Multiple Vitamin (MULTIVITAMIN) capsule, Take 1 capsule by mouth daily., Disp: , Rfl:    tirzepatide  (ZEPBOUND ) 5 MG/0.5ML Pen, Inject 5 mg into the skin once a week., Disp: 2 mL, Rfl: 0   triamcinolone cream (KENALOG) 0.1 %, Apply 1 Application topically 2 (two) times daily., Disp: 30 g, Rfl: 0   valsartan  (DIOVAN ) 80 MG tablet, Take 1 tablet (80 mg total) by mouth daily., Disp: 90 tablet, Rfl: 4      Objective:    BP 130/70   Pulse 75   Temp 98 F (36.7 C)   Resp 16   Ht 5' 6 (1.676 m)   Wt 214 lb 6.4 oz (97.3 kg)   LMP 01/20/2017   SpO2 99%   BMI 34.61 kg/m   Wt Readings from Last 3 Encounters:  07/14/23 212 lb (96.2 kg)  07/08/23 206 lb (93.4 kg)  07/03/23 214 lb 6.4 oz (97.3 kg)    Physical Exam  Skin:    Comments: Right mid pad of foot with small annular calloused tender area slightly raised            Assessment &  Plan:  Plantar wart of right foot Assessment & Plan: Pt gave verbal consent prior. Scalpel used to par wart prior to treatment with cryotherapy. Cryotherapy blast applied until white bead formation x 2 rounds. Advised pt to apply Aquaphor otherwise no other treatment needed. Educated will likely start to resolve within 4-6 days, may blister and or slough off however monitor for s/s infection. Pt tolerated procedure well  May need repeat rounds of cryotherapy pt knows to schedule f/u if repeat session needed.  Handout given to pt as well.        No follow-ups on file.  Ginger Patrick, MSN, APRN, FNP-C [facility]

## 2023-07-08 ENCOUNTER — Ambulatory Visit (INDEPENDENT_AMBULATORY_CARE_PROVIDER_SITE_OTHER): Admitting: Family Medicine

## 2023-07-08 ENCOUNTER — Encounter (INDEPENDENT_AMBULATORY_CARE_PROVIDER_SITE_OTHER): Payer: Self-pay | Admitting: Family Medicine

## 2023-07-08 VITALS — BP 134/83 | HR 66 | Temp 97.6°F | Ht 66.0 in | Wt 206.0 lb

## 2023-07-08 DIAGNOSIS — R7303 Prediabetes: Secondary | ICD-10-CM

## 2023-07-08 DIAGNOSIS — E669 Obesity, unspecified: Secondary | ICD-10-CM | POA: Diagnosis not present

## 2023-07-08 DIAGNOSIS — I10 Essential (primary) hypertension: Secondary | ICD-10-CM | POA: Diagnosis not present

## 2023-07-08 DIAGNOSIS — E66812 Obesity, class 2: Secondary | ICD-10-CM

## 2023-07-08 DIAGNOSIS — R632 Polyphagia: Secondary | ICD-10-CM

## 2023-07-08 DIAGNOSIS — Z6833 Body mass index (BMI) 33.0-33.9, adult: Secondary | ICD-10-CM

## 2023-07-08 MED ORDER — ZEPBOUND 5 MG/0.5ML ~~LOC~~ SOAJ: 5.0000 mg | SUBCUTANEOUS | 0 refills | Status: DC

## 2023-07-08 NOTE — Progress Notes (Signed)
 Office: [phone]  /  Fax: [phone]  WEIGHT SUMMARY AND BIOMETRICS  Anthropometric Measurements Height: 5' 6 (1.676 m) Weight: 206 lb (93.4 kg) BMI (Calculated): 33.27 Weight at Last Visit: 208 lb Weight Lost Since Last Visit: 2 lb Weight Gained Since Last Visit: 0 Starting Weight: 223 lb Total Weight Loss (lbs): 17 lb (7.711 kg) Peak Weight: 240 lb   Body Composition  Body Fat %: 42 % Fat Mass (lbs): 86.8 lbs Muscle Mass (lbs): 113.8 lbs Total Body Water (lbs): 86.4 lbs Visceral Fat Rating : 11   Other Clinical Data Fasting: No Labs: No Today's Visit #: 55 Starting Date: 08/30/19    Chief Complaint: OBESITY    History of Present Illness Ruth Elliott is a 53 year old female with obesity, hypertension, and prediabetes who presents for obesity treatment and progress assessment.  She adheres to a category three eating plan approximately sixty percent of the time and has increased her physical activity, engaging in walking and horseback riding for twenty-five to thirty minutes, five times a week. She has lost two pounds in the last month.  She is currently on valsartan  and amlodipine  for hypertension management. She is also focusing on diet, exercise, and weight loss to aid in controlling her hypertension.  She has a history of prediabetes and is working on reducing simple carbohydrates in her diet. She takes metformin  500 mg twice daily and Zepbound  for polyphagia associated with prediabetes. No stomach issues with Zepbound , and it helps her not feel hungry, although she sometimes struggles to meet her nutritional needs.  She sometimes misses her second dose of metformin  due to late lunch timing. She is mindful of her protein intake, often consuming protein-based meals and incorporating more vegetables like salads and carrot sticks into her diet. She uses [place] yogurt with lemon pepper seasoning as a dip and occasionally drinks protein shakes when she doesn't  have time to eat a full meal.      PHYSICAL EXAM:  Blood pressure 134/83, pulse 66, temperature 97.6 F (36.4 C), height 5' 6 (1.676 m), weight 206 lb (93.4 kg), last menstrual period 01/20/2017, SpO2 99%. Body mass index is 33.25 kg/m.  DIAGNOSTIC DATA REVIEWED:  BMET    Component Value Date/Time   NA 141 04/14/2023 0754   K 4.8 04/14/2023 0754   CL 103 04/14/2023 0754   CO2 23 04/14/2023 0754   GLUCOSE 85 04/14/2023 0754   BUN 13 04/14/2023 0754   CREATININE 0.79 04/14/2023 0754   CALCIUM 9.7 04/14/2023 0754   GFRNONAA 105 02/14/2020 1204   GFRAA 121 02/14/2020 1204   Lab Results  Component Value Date   HGBA1C 5.4 04/14/2023   HGBA1C 5.4 08/31/2013   Lab Results  Component Value Date   INSULIN  14.7 04/14/2023   INSULIN  12.7 08/30/2019   Lab Results  Component Value Date   TSH 1.760 04/14/2023   CBC    Component Value Date/Time   WBC 9.2 01/30/2022 0849   RBC 4.29 01/30/2022 0849   HGB 14.1 01/30/2022 0849   HCT 42.0 01/30/2022 0849   PLT 180 01/30/2022 0849   MCV 98 (H) 01/30/2022 0849   MCH 32.9 01/30/2022 0849   MCHC 33.6 01/30/2022 0849   RDW 12.6 01/30/2022 0849   Iron Studies    Component Value Date/Time   IRON 172 (H) 01/30/2022 0849   TIBC 309 01/30/2022 0849   FERRITIN 239 (H) 01/30/2022 0849   IRONPCTSAT 56 (H) 01/30/2022 0849   Lipid Panel  Component Value Date/Time   CHOL 109 04/14/2023 0754   TRIG 80 04/14/2023 0754   HDL 41 04/14/2023 0754   CHOLHDL 2.5 08/09/2019 1047   LDLCALC 52 04/14/2023 0754   Hepatic Function Panel     Component Value Date/Time   PROT 7.0 04/14/2023 0754   ALBUMIN 4.4 04/14/2023 0754   AST 32 04/14/2023 0754   ALT 33 (H) 04/14/2023 0754   ALKPHOS 96 04/14/2023 0754   BILITOT 0.7 04/14/2023 0754   BILIDIR 0.13 09/13/2019 1117      Component Value Date/Time   TSH 1.760 04/14/2023 0754   Nutritional Lab Results  Component Value Date   VD25OH 78.3 04/14/2023   VD25OH 39.0 07/31/2022    VD25OH 51.7 08/23/2021     Assessment and Plan Assessment & Plan Obesity She follows a category three eating plan about 60% of the time and has increased physical activity through walking and horseback riding, resulting in a two-pound weight loss in the last month. Concerns about malnutrition and muscle mass loss associated with GLP-1 medications were discussed, emphasizing the need for adequate nutrition, particularly protein intake. - Refill Zepbound  - Continue category three eating plan or journal plan with 1500 calories and 100 or more grams of protein daily - Encourage continued physical activity - Use protein drinks as emergency rations if meals are missed  Prediabetes She manages prediabetes by reducing simple carbohydrates and taking metformin  500 mg BID. Zepbound  is used for polyphagia associated with prediabetes. Adequate nutrition and protein intake are emphasized to support health and prevent complications. - Continue metformin  500 mg BID - Refill Zepbound  - Encourage reduction of simple carbohydrates  Hypertension Hypertension is well-controlled with a blood pressure of 134/83 mmHg. She is on valsartan  and amlodipine , and is also focusing on diet, exercise, and weight loss. Medication reduction may be considered if lifestyle changes remain effective. - Continue valsartan  and amlodipine  - Encourage diet, exercise, and weight loss for hypertension management  General Health Maintenance Hydration is emphasized, especially during physical activity. - Encourage hydration, especially during physical activity  Follow-up She has a follow-up appointment scheduled for July. - Schedule follow-up appointment in August      She was informed of the importance of frequent follow up visits to maximize her success with intensive lifestyle modifications for her multiple health conditions.    [name], MD

## 2023-07-14 ENCOUNTER — Ambulatory Visit: Admitting: Family

## 2023-07-14 ENCOUNTER — Encounter: Payer: Self-pay | Admitting: Family

## 2023-07-14 VITALS — BP 122/82 | HR 75 | Temp 98.1°F | Ht 66.0 in | Wt 212.0 lb

## 2023-07-14 DIAGNOSIS — L03119 Cellulitis of unspecified part of limb: Secondary | ICD-10-CM | POA: Insufficient documentation

## 2023-07-14 DIAGNOSIS — L299 Pruritus, unspecified: Secondary | ICD-10-CM

## 2023-07-14 MED ORDER — TRIAMCINOLONE ACETONIDE 0.1 % EX CREA: 1.0000 | TOPICAL_CREAM | Freq: Two times a day (BID) | CUTANEOUS | 0 refills | Status: AC

## 2023-07-14 MED ORDER — CEPHALEXIN 500 MG PO CAPS: 500.0000 mg | ORAL_CAPSULE | Freq: Three times a day (TID) | ORAL | 0 refills | Status: AC

## 2023-07-14 NOTE — Assessment & Plan Note (Signed)
 Suspected cellulitis  Rx cephalexin 500 mg tid x 7 days.  Please monitor site for worsening signs/symptoms of infection to include: increasing redness, increasing tenderness, increase in size, and or pustulant drainage from site. If this is to occur please let me know immediately.   Less likely osteomyelitis as full movement

## 2023-07-14 NOTE — Progress Notes (Signed)
 Established Patient Office Visit  Subjective:   Patient ID: Ruth Elliott, female    DOB: [birth year]  Age: 53 y.o. MRN: [id number]  CC:  Chief Complaint  Patient presents with   Rash    X 3 days right hand     HPI: Ruth Elliott is a 53 y.o. female presenting on 07/14/2023 for Rash (X 3 days right hand )  About one week ago was bit by something, looks like she was stung by something she states as she does have a puncture site. Yesterday noticed some swelling and redness at the base of the third and fourth metacarpal. She has some bruising at the same site however did knock her hand on something in that spot. No fever. No discharge. Able to move hand without difficulty, no pain with ROM.        ROS: Negative unless specifically indicated above in HPI.   Relevant past medical history reviewed and updated as indicated.   Allergies and medications reviewed and updated.   Current Outpatient Medications:    amLODipine  (NORVASC ) 5 MG tablet, TAKE 1 TABLET(5 MG) BY MOUTH DAILY, Disp: 90 tablet, Rfl: 4   B Complex-C-Folic Acid (HM SUPER VITAMIN B COMPLEX/C PO), Take by mouth., Disp: , Rfl:    cephALEXin (KEFLEX) 500 MG capsule, Take 1 capsule (500 mg total) by mouth 3 (three) times daily for 7 days., Disp: 21 capsule, Rfl: 0   cetirizine (ZYRTEC) 10 MG chewable tablet, Chew 10 mg by mouth daily., Disp: , Rfl:    cholecalciferol (VITAMIN D3) 25 MCG (1000 UNIT) tablet, Take 1,000 Units by mouth daily., Disp: , Rfl:    Flax Oil-Fish Oil-Borage Oil (FISH OIL-FLAX OIL-BORAGE OIL) CAPS, Take 2 capsules by mouth daily. , Disp: , Rfl:    fluticasone  (FLONASE ) 50 MCG/ACT nasal spray, SHAKE LIQUID AND USE 2 SPRAYS IN EACH NOSTRIL DAILY, Disp: 16 g, Rfl: 1   ibuprofen (ADVIL) 400 MG tablet, Take 400 mg by mouth every 6 (six) hours as needed., Disp: , Rfl:    metFORMIN  (GLUCOPHAGE ) 500 MG tablet, Take 1 tablet (500 mg total) by mouth 2 (two) times daily with a meal., Disp: 180 tablet, Rfl:  0   Multiple Vitamin (MULTIVITAMIN) capsule, Take 1 capsule by mouth daily., Disp: , Rfl:    tirzepatide  (ZEPBOUND ) 5 MG/0.5ML Pen, Inject 5 mg into the skin once a week., Disp: 2 mL, Rfl: 0   triamcinolone cream (KENALOG) 0.1 %, Apply 1 Application topically 2 (two) times daily., Disp: 30 g, Rfl: 0   valsartan  (DIOVAN ) 80 MG tablet, Take 1 tablet (80 mg total) by mouth daily., Disp: 90 tablet, Rfl: 4  Allergies  Allergen Reactions   Penicillins Shortness Of Breath    DID tolerate Omnicef  October 2024    Hydralazine      Elevated blood pressure, migraine, and lethary   Lisinopril      paresthesia   Singulair [Montelukast Sodium]     Moody    Objective:   BP 122/82   Pulse 75   Temp 98.1 F (36.7 C) (Oral)   Ht 5' 6 (1.676 m)   Wt 212 lb (96.2 kg)   LMP 01/20/2017   SpO2 98%   BMI 34.22 kg/m    Physical Exam Vitals reviewed.  Constitutional:      General: She is not in acute distress.    Appearance: Normal appearance. She is normal weight. She is not ill-appearing, toxic-appearing or diaphoretic.  HENT:     Head:  Normocephalic.   Cardiovascular:     Rate and Rhythm: Normal rate.  Pulmonary:     Effort: Pulmonary effort is normal.   Musculoskeletal:        General: Normal range of motion.     Right hand: No bony tenderness. Normal range of motion. Normal strength. Normal sensation.   Skin:    Comments: Base of third and fourth metacarpal with mild edema and erythema. Ecchymosis base of right fourth metacarpal. No discharge. No open wound. Stinger puncture site webbing between third and fourth metacarpal.    Neurological:     General: No focal deficit present.     Mental Status: She is alert and oriented to person, place, and time. Mental status is at baseline.   Psychiatric:        Mood and Affect: Mood normal.        Behavior: Behavior normal.        Thought Content: Thought content normal.        Judgment: Judgment normal.     Assessment & Plan:   Cellulitis of hand Assessment & Plan: Suspected cellulitis  Rx cephalexin 500 mg tid x 7 days.  Please monitor site for worsening signs/symptoms of infection to include: increasing redness, increasing tenderness, increase in size, and or pustulant drainage from site. If this is to occur please let me know immediately.   Less likely osteomyelitis as full movement   Orders: -     Cephalexin; Take 1 capsule (500 mg total) by mouth 3 (three) times daily for 7 days.  Dispense: 21 capsule; Refill: 0  Pruritus -     Triamcinolone Acetonide; Apply 1 Application topically 2 (two) times daily.  Dispense: 30 g; Refill: 0     Follow up plan: Return if symptoms worsen or fail to improve.  Ginger Patrick, FNP

## 2023-07-14 NOTE — Assessment & Plan Note (Signed)
  Pt gave verbal consent prior. Scalpel used to par wart prior to treatment with cryotherapy. Cryotherapy blast applied until white bead formation x 2 rounds. Advised pt to apply Aquaphor otherwise no other treatment needed. Educated will likely start to resolve within 4-6 days, may blister and or slough off however monitor for s/s infection. Pt tolerated procedure well  May need repeat rounds of cryotherapy pt knows to schedule f/u if repeat session needed.  Handout given to pt as well.

## 2023-08-06 ENCOUNTER — Ambulatory Visit (INDEPENDENT_AMBULATORY_CARE_PROVIDER_SITE_OTHER): Admitting: Family Medicine

## 2023-08-06 ENCOUNTER — Encounter (INDEPENDENT_AMBULATORY_CARE_PROVIDER_SITE_OTHER): Payer: Self-pay | Admitting: Family Medicine

## 2023-08-06 VITALS — BP 137/84 | HR 77 | Temp 97.9°F | Ht 66.0 in | Wt 212.0 lb

## 2023-08-06 DIAGNOSIS — R7303 Prediabetes: Secondary | ICD-10-CM | POA: Diagnosis not present

## 2023-08-06 DIAGNOSIS — E669 Obesity, unspecified: Secondary | ICD-10-CM

## 2023-08-06 DIAGNOSIS — Z6833 Body mass index (BMI) 33.0-33.9, adult: Secondary | ICD-10-CM

## 2023-08-06 DIAGNOSIS — G479 Sleep disorder, unspecified: Secondary | ICD-10-CM

## 2023-08-06 DIAGNOSIS — R632 Polyphagia: Secondary | ICD-10-CM

## 2023-08-06 DIAGNOSIS — E66812 Obesity, class 2: Secondary | ICD-10-CM

## 2023-08-06 DIAGNOSIS — Z6834 Body mass index (BMI) 34.0-34.9, adult: Secondary | ICD-10-CM

## 2023-08-06 MED ORDER — ZEPBOUND 5 MG/0.5ML ~~LOC~~ SOAJ: 5.0000 mg | SUBCUTANEOUS | 0 refills | Status: DC

## 2023-08-06 MED ORDER — METFORMIN HCL 500 MG PO TABS: 500.0000 mg | ORAL_TABLET | Freq: Two times a day (BID) | ORAL | 0 refills | Status: DC

## 2023-08-06 NOTE — Progress Notes (Signed)
 Office: [phone]  /  Fax: [phone]  WEIGHT SUMMARY AND BIOMETRICS  Anthropometric Measurements Height: 5' 6 (1.676 m) Weight: 212 lb (96.2 kg) BMI (Calculated): 34.23 Weight at Last Visit: 206 lb Weight Lost Since Last Visit: 0 Weight Gained Since Last Visit: 6 lb Starting Weight: 223 lb Total Weight Loss (lbs): 11 lb (4.99 kg) Peak Weight: 240 lb   Body Composition  Body Fat %: 44.8 % Fat Mass (lbs): 95.2 lbs Muscle Mass (lbs): 111.2 lbs Total Body Water (lbs): 93.8 lbs Visceral Fat Rating : 12   Other Clinical Data Fasting: no Labs: no Today's Visit #: 56 Starting Date: 08/30/19    Chief Complaint: OBESITY    History of Present Illness Ruth Elliott is a 53 year old female who presents for obesity treatment plan assessment and progress evaluation.  She is currently on a category three eating plan, which she follows about sixty percent of the time. Despite engaging in physical activities such as walking and horse riding for about twenty minutes, five times a week, she has gained six pounds in the last month. She notes that eating [place] food often causes her to gain water weight, and she has recently made dietary choices that may have contributed to her weight gain.  She is experiencing significant sleep disturbances, often getting only four to five hours of sleep per night. She attributes this to her late-night activities and difficulty managing her schedule around the heat of the day. She feels tired and notes that inadequate sleep is affecting her daily functioning, including her ability to perform tasks at work.  She recently received a shingles vaccine and reports that her arm is still sore, but she did not experience flu-like symptoms as she did with previous vaccinations. She has a history of chickenpox despite having been vaccinated, which she describes as a mild case.  She is planning to visit her brother's house in [place] , where she hopes to  catch up on sleep. She is also managing her diet by preparing meals such as [place] sandwiches to ensure she eats lunch regularly.      PHYSICAL EXAM:  Blood pressure 137/84, pulse 77, temperature 97.9 F (36.6 C), height 5' 6 (1.676 m), weight 212 lb (96.2 kg), last menstrual period 01/20/2017, SpO2 98%. Body mass index is 34.22 kg/m.  DIAGNOSTIC DATA REVIEWED:  BMET    Component Value Date/Time   NA 141 04/14/2023 0754   K 4.8 04/14/2023 0754   CL 103 04/14/2023 0754   CO2 23 04/14/2023 0754   GLUCOSE 85 04/14/2023 0754   BUN 13 04/14/2023 0754   CREATININE 0.79 04/14/2023 0754   CALCIUM 9.7 04/14/2023 0754   GFRNONAA 105 02/14/2020 1204   GFRAA 121 02/14/2020 1204   Lab Results  Component Value Date   HGBA1C 5.4 04/14/2023   HGBA1C 5.4 08/31/2013   Lab Results  Component Value Date   INSULIN  14.7 04/14/2023   INSULIN  12.7 08/30/2019   Lab Results  Component Value Date   TSH 1.760 04/14/2023   CBC    Component Value Date/Time   WBC 9.2 01/30/2022 0849   RBC 4.29 01/30/2022 0849   HGB 14.1 01/30/2022 0849   HCT 42.0 01/30/2022 0849   PLT 180 01/30/2022 0849   MCV 98 (H) 01/30/2022 0849   MCH 32.9 01/30/2022 0849   MCHC 33.6 01/30/2022 0849   RDW 12.6 01/30/2022 0849   Iron Studies    Component Value Date/Time   IRON 172 (H) 01/30/2022  0849   TIBC 309 01/30/2022 0849   FERRITIN 239 (H) 01/30/2022 0849   IRONPCTSAT 56 (H) 01/30/2022 0849   Lipid Panel     Component Value Date/Time   CHOL 109 04/14/2023 0754   TRIG 80 04/14/2023 0754   HDL 41 04/14/2023 0754   CHOLHDL 2.5 08/09/2019 1047   LDLCALC 52 04/14/2023 0754   Hepatic Function Panel     Component Value Date/Time   PROT 7.0 04/14/2023 0754   ALBUMIN 4.4 04/14/2023 0754   AST 32 04/14/2023 0754   ALT 33 (H) 04/14/2023 0754   ALKPHOS 96 04/14/2023 0754   BILITOT 0.7 04/14/2023 0754   BILIDIR 0.13 09/13/2019 1117      Component Value Date/Time   TSH 1.760 04/14/2023 0754    Nutritional Lab Results  Component Value Date   VD25OH 78.3 04/14/2023   VD25OH 39.0 07/31/2022   VD25OH 51.7 08/23/2021     Assessment and Plan Assessment & Plan Obesity She follows a category three eating plan 60% of the time and engages in physical activities such as walking and horse riding for 20 minutes, five times a week. Despite these efforts, she has gained six pounds in the last month, attributed to fluid retention, dietary choices, and inadequate sleep. Emphasized the importance of consistent adherence to the eating plan, regular physical activity, adequate hydration, and avoiding high-sodium foods. Discussed the impact of inadequate sleep on metabolism and weight gain, highlighting the need for improved sleep quality. - Continue category three eating plan - Engage in physical activities such as walking and horse riding - Ensure adequate hydration  Sleep Disturbance She reports inadequate sleep, often getting only four to five hours per night, affecting overall health and contributing to weight gain. Discussed strategies to improve sleep duration and quality, emphasizing the goal of achieving at least seven hours of sleep per night. Plans to address sleep debt during an upcoming trip and will work on strategies to improve sleep upon return. - Aim for at least seven hours of sleep per night - Address sleep debt during upcoming trip - Develop strategies to improve sleep quality  Prediabetes She is currently on metformin  for prediabetes management with no reported issues. Emphasized the importance of maintaining a healthy diet and regular physical activity to manage blood glucose levels. - Continue metformin  - Maintain healthy diet and regular physical activity  Polyphagia She experiences polyphagia, managed with Zepbound . Discussed the importance of ensuring adequate protein intake and provided a recipe for a protein-rich dessert to help manage hunger and maintain  nutritional balance. - Continue Zepbound  5 mg - Ensure adequate protein intake - Try protein pudding recipe  General Health Maintenance She recently received a shingles vaccine and reported mild arm pain as the only side effect. Scheduled for a follow-up dose in two to six months. Discussed the importance of vaccinations and maintaining overall health. - Schedule follow-up shingles vaccine in two to six months  Follow-up She has a follow-up appointment scheduled to review health screening paperwork and ensure all necessary tests are completed. Emphasized the importance of timely follow-up to monitor health conditions. - Attend follow-up appointment on August 21st - Bring health screening paperwork to the appointment   She was informed of the importance of frequent follow up visits to maximize her success with intensive lifestyle modifications for her multiple health conditions.    [name], MD

## 2023-08-21 ENCOUNTER — Telehealth (INDEPENDENT_AMBULATORY_CARE_PROVIDER_SITE_OTHER): Payer: Self-pay

## 2023-08-21 NOTE — Telephone Encounter (Signed)
 Ruth Elliott  (Key: [id number]) Zepbound  5MG /0.5ML pen-injectors

## 2023-08-28 ENCOUNTER — Ambulatory Visit (INDEPENDENT_AMBULATORY_CARE_PROVIDER_SITE_OTHER): Admitting: Family Medicine

## 2023-08-28 NOTE — Telephone Encounter (Signed)
 Fax from [facility] Rx/Appeals department.  Asking for more information.  Information has been submitted and faxed to [phone].  Now we are awaiting determination.

## 2023-08-30 ENCOUNTER — Other Ambulatory Visit (INDEPENDENT_AMBULATORY_CARE_PROVIDER_SITE_OTHER): Payer: Self-pay | Admitting: Family Medicine

## 2023-08-30 DIAGNOSIS — R632 Polyphagia: Secondary | ICD-10-CM

## 2023-08-30 DIAGNOSIS — R7303 Prediabetes: Secondary | ICD-10-CM

## 2023-08-30 DIAGNOSIS — E66812 Obesity, class 2: Secondary | ICD-10-CM

## 2023-09-01 LAB — HM MAMMOGRAPHY

## 2023-09-03 NOTE — Telephone Encounter (Signed)
 Fax from [facility], Zepbound  has been approved until 08/27/2024. Patient has been been notified.

## 2023-09-04 ENCOUNTER — Ambulatory Visit: Payer: Self-pay | Admitting: Family

## 2023-09-04 ENCOUNTER — Ambulatory Visit (INDEPENDENT_AMBULATORY_CARE_PROVIDER_SITE_OTHER): Admitting: Family Medicine

## 2023-09-04 ENCOUNTER — Telehealth (INDEPENDENT_AMBULATORY_CARE_PROVIDER_SITE_OTHER): Payer: Self-pay | Admitting: Family Medicine

## 2023-09-04 ENCOUNTER — Encounter (INDEPENDENT_AMBULATORY_CARE_PROVIDER_SITE_OTHER): Payer: Self-pay | Admitting: Family Medicine

## 2023-09-04 VITALS — BP 127/83 | HR 71 | Temp 98.0°F | Ht 66.0 in | Wt 208.0 lb

## 2023-09-04 DIAGNOSIS — R632 Polyphagia: Secondary | ICD-10-CM | POA: Diagnosis not present

## 2023-09-04 DIAGNOSIS — E669 Obesity, unspecified: Secondary | ICD-10-CM | POA: Diagnosis not present

## 2023-09-04 DIAGNOSIS — Z6834 Body mass index (BMI) 34.0-34.9, adult: Secondary | ICD-10-CM

## 2023-09-04 DIAGNOSIS — R5383 Other fatigue: Secondary | ICD-10-CM

## 2023-09-04 DIAGNOSIS — E66812 Obesity, class 2: Secondary | ICD-10-CM

## 2023-09-04 DIAGNOSIS — R7303 Prediabetes: Secondary | ICD-10-CM

## 2023-09-04 DIAGNOSIS — Z6833 Body mass index (BMI) 33.0-33.9, adult: Secondary | ICD-10-CM

## 2023-09-04 MED ORDER — ZEPBOUND 5 MG/0.5ML ~~LOC~~ SOAJ
5.0000 mg | SUBCUTANEOUS | 0 refills | Status: DC
Start: 2023-09-04 — End: 2023-09-29

## 2023-09-04 MED ORDER — METFORMIN HCL 500 MG PO TABS: 500.0000 mg | ORAL_TABLET | Freq: Two times a day (BID) | ORAL | 0 refills | Status: DC

## 2023-09-04 NOTE — Progress Notes (Signed)
 Office: [phone]  /  Fax: [phone]  WEIGHT SUMMARY AND BIOMETRICS  Anthropometric Measurements Height: 5' 6 (1.676 m) Weight: 208 lb (94.3 kg) BMI (Calculated): 33.59 Weight at Last Visit: 212 lb Weight Lost Since Last Visit: 4 lb Weight Gained Since Last Visit: 0 Starting Weight: 223 lb Total Weight Loss (lbs): 15 lb (6.804 kg) Peak Weight: 240 lb   Body Composition  Body Fat %: 42.7 % Fat Mass (lbs): 88.8 lbs Muscle Mass (lbs): 113.2 lbs Total Body Water (lbs): 89 lbs Visceral Fat Rating : 11   Other Clinical Data Fasting: no Labs: no Today's Visit #: 98 Starting Date: 08/30/19    Chief Complaint: OBESITY    History of Present Illness Ruth Elliott is a 53 year old female who presents for a follow-up on her obesity treatment and progress.  She adheres to the category three eating plan approximately 80% of the time and engages in physical activity, including using a stair stepper or riding her horse for 20 to 30 minutes, five days a week. She has lost four pounds in the last month.  Her hunger levels are stable, and she manages her protein intake well. However, she acknowledges insufficient hydration, often forgetting to drink water during the day. She experiences occasional lightheadedness.  She feels drained, particularly in the afternoons when she gets home, and reports that this feeling is similar to what she experienced before starting Zepbound . She is not experiencing nausea and is taking Zepbound  and metformin , which she takes regularly at lunch.  She did not take her Zyrtec due to not having it at home, leading to feeling stuffy all day.      PHYSICAL EXAM:  Blood pressure 127/83, pulse 71, temperature 98 F (36.7 C), height 5' 6 (1.676 m), weight 208 lb (94.3 kg), last menstrual period 01/20/2017, SpO2 96%. Body mass index is 33.57 kg/m.  DIAGNOSTIC DATA REVIEWED:  BMET    Component Value Date/Time   NA 141 04/14/2023 0754   K  4.8 04/14/2023 0754   CL 103 04/14/2023 0754   CO2 23 04/14/2023 0754   GLUCOSE 85 04/14/2023 0754   BUN 13 04/14/2023 0754   CREATININE 0.79 04/14/2023 0754   CALCIUM 9.7 04/14/2023 0754   GFRNONAA 105 02/14/2020 1204   GFRAA 121 02/14/2020 1204   Lab Results  Component Value Date   HGBA1C 5.4 04/14/2023   HGBA1C 5.4 08/31/2013   Lab Results  Component Value Date   INSULIN  14.7 04/14/2023   INSULIN  12.7 08/30/2019   Lab Results  Component Value Date   TSH 1.760 04/14/2023   CBC    Component Value Date/Time   WBC 9.2 01/30/2022 0849   RBC 4.29 01/30/2022 0849   HGB 14.1 01/30/2022 0849   HCT 42.0 01/30/2022 0849   PLT 180 01/30/2022 0849   MCV 98 (H) 01/30/2022 0849   MCH 32.9 01/30/2022 0849   MCHC 33.6 01/30/2022 0849   RDW 12.6 01/30/2022 0849   Iron Studies    Component Value Date/Time   IRON 172 (H) 01/30/2022 0849   TIBC 309 01/30/2022 0849   FERRITIN 239 (H) 01/30/2022 0849   IRONPCTSAT 56 (H) 01/30/2022 0849   Lipid Panel     Component Value Date/Time   CHOL 109 04/14/2023 0754   TRIG 80 04/14/2023 0754   HDL 41 04/14/2023 0754   CHOLHDL 2.5 08/09/2019 1047   LDLCALC 52 04/14/2023 0754   Hepatic Function Panel     Component Value Date/Time  PROT 7.0 04/14/2023 0754   ALBUMIN 4.4 04/14/2023 0754   AST 32 04/14/2023 0754   ALT 33 (H) 04/14/2023 0754   ALKPHOS 96 04/14/2023 0754   BILITOT 0.7 04/14/2023 0754   BILIDIR 0.13 09/13/2019 1117      Component Value Date/Time   TSH 1.760 04/14/2023 0754   Nutritional Lab Results  Component Value Date   VD25OH 78.3 04/14/2023   VD25OH 39.0 07/31/2022   VD25OH 51.7 08/23/2021     Assessment and Plan Assessment & Plan Obesity and Polyphagia Obesity management is ongoing with a category three eating plan, followed approximately 80% of the time. Exercise includes 20-30 minutes of activity five days a week, such as using a stair stepper or horseback riding. Weight loss of four pounds in the  last month indicates progress. Hydration needs improvement, and there is no significant hunger reported. She is advised to be mindful of nutritional intake to avoid malnourishment, especially while on Zepbound . - Continue category three eating plan. - Encourage increased hydration. - Continue current exercise regimen. - Monitor nutritional intake to prevent malnourishment. - Refill Zepbound  prescription.  Prediabetes Prediabetes is managed with attention to protein intake and the use of Zepbound . She reports no significant nausea and feels hunger is well-controlled. There is a concern about feeling drained, which may be related to nutritional intake or medication effects. The possibility of reducing Zepbound  dosage to 2.5 mg was discussed if symptoms persist. - Monitor nutritional intake and ensure adequate protein consumption. - Consider reducing Zepbound  dosage to 2.5 mg if fatigue persists. - Refill Metformin  prescription.  Fatigue Fatigue is noted, particularly in the afternoon, and may be related to nutritional intake or medication effects. She reports feeling drained, which could be due to insufficient nutrition or the effects of Zepbound . Sleep patterns were disrupted recently, but this is not considered the primary cause of fatigue. - Monitor and ensure adequate nutritional intake throughout the day. - Evaluate the impact of Zepbound  on energy levels.    She was informed of the importance of frequent follow up visits to maximize her success with intensive lifestyle modifications for her multiple health conditions.    [name], MD

## 2023-09-04 NOTE — Telephone Encounter (Signed)
 Good afternoon!  The pharmacy says they have the metformin  script but not the one for zepbound . She's asking that this be corrected.  Thanks!

## 2023-09-29 ENCOUNTER — Ambulatory Visit (INDEPENDENT_AMBULATORY_CARE_PROVIDER_SITE_OTHER): Admitting: Family Medicine

## 2023-09-29 ENCOUNTER — Encounter (INDEPENDENT_AMBULATORY_CARE_PROVIDER_SITE_OTHER): Payer: Self-pay | Admitting: Family Medicine

## 2023-09-29 VITALS — BP 136/84 | HR 65 | Temp 98.2°F | Ht 66.0 in | Wt 205.0 lb

## 2023-09-29 DIAGNOSIS — R7303 Prediabetes: Secondary | ICD-10-CM

## 2023-09-29 DIAGNOSIS — E66812 Obesity, class 2: Secondary | ICD-10-CM

## 2023-09-29 DIAGNOSIS — I10 Essential (primary) hypertension: Secondary | ICD-10-CM

## 2023-09-29 DIAGNOSIS — R632 Polyphagia: Secondary | ICD-10-CM | POA: Diagnosis not present

## 2023-09-29 DIAGNOSIS — Z6833 Body mass index (BMI) 33.0-33.9, adult: Secondary | ICD-10-CM

## 2023-09-29 MED ORDER — ZEPBOUND 5 MG/0.5ML ~~LOC~~ SOAJ: 5.0000 mg | SUBCUTANEOUS | 0 refills | Status: DC

## 2023-09-29 NOTE — Progress Notes (Signed)
 Office: [phone]  /  Fax: [phone]  WEIGHT SUMMARY AND BIOMETRICS  Anthropometric Measurements Height: 5' 6 (1.676 m) Weight: 205 lb (93 kg) BMI (Calculated): 33.1 Weight at Last Visit: 208 lb Weight Lost Since Last Visit: 3 lb Weight Gained Since Last Visit: 0 Starting Weight: 223 lb Total Weight Loss (lbs): 18 lb (8.165 kg) Peak Weight: 240 lb   Body Composition  Body Fat %: 42.1 % Fat Mass (lbs): 86.4 lbs Muscle Mass (lbs): 112.8 lbs Total Body Water (lbs): 86.6 lbs Visceral Fat Rating : 11   Other Clinical Data Fasting: yes Labs: no Today's Visit #: 8 Starting Date: 08/30/19    Chief Complaint: OBESITY   History of Present Illness Ruth Elliott is a 53 year old female with obesity, hypertension, and prediabetes who presents for obesity treatment follow-up.  She has been adhering to a category three eating plan approximately 80% of the time and is actively tracking her caloric intake. She is focusing on consuming the recommended amount of protein and maintaining adequate hydration. However, she occasionally skips meals and struggles to consistently achieve 7-9 hours of sleep per night. Her intake of fruits and vegetables is less than ideal. For physical activity, she walks for 30 minutes five days a week and engages in farm work, which includes caring for animals. She has lost three pounds in the past month.  Her hypertension is managed with valsartan  and amlodipine . She notes that her blood pressure typically runs lower at home, around 109-110/68-70 mmHg. She experiences headaches related to allergies.  She is managing prediabetes with metformin  and Zepbound , which are contributing to weight loss and glucose control. She encountered issues with her insurance requiring a [employer] membership for prescription coverage, necessitating multiple calls to resolve the issue.  She experiences hip discomfort, likely due to prolonged sitting followed by  physical activity. She has been performing physical therapy exercises for her hip, which have led to improvement. She also notes that her back did not hurt after engaging in significant physical activity, such as cleaning stalls.  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      PHYSICAL EXAM:  Blood pressure 136/84, pulse 65, temperature 98.2 F (36.8 C), height 5' 6 (1.676 m), weight 205 lb (93 kg), last menstrual period 01/20/2017, SpO2 97%. Body mass index is 33.09 kg/m.  DIAGNOSTIC DATA REVIEWED:  BMET    Component Value Date/Time   NA 141 04/14/2023 0754   K 4.8 04/14/2023 0754   CL 103 04/14/2023 0754   CO2 23 04/14/2023 0754   GLUCOSE 85 04/14/2023 0754   BUN 13 04/14/2023 0754   CREATININE 0.79 04/14/2023 0754   CALCIUM 9.7 04/14/2023 0754   GFRNONAA 105 02/14/2020 1204   GFRAA 121 02/14/2020 1204   Lab Results  Component Value Date   HGBA1C 5.4 04/14/2023   HGBA1C 5.4 08/31/2013   Lab Results  Component Value Date   INSULIN  14.7 04/14/2023   INSULIN  12.7 08/30/2019   Lab Results  Component Value Date   TSH 1.760 04/14/2023   CBC    Component Value Date/Time   WBC 9.2 01/30/2022 0849   RBC 4.29 01/30/2022 0849   HGB 14.1 01/30/2022 0849   HCT 42.0 01/30/2022 0849   PLT 180 01/30/2022 0849   MCV 98 (H) 01/30/2022 0849   MCH 32.9 01/30/2022 0849   MCHC 33.6 01/30/2022 0849  RDW 12.6 01/30/2022 0849   Iron Studies    Component Value Date/Time   IRON 172 (H) 01/30/2022 0849   TIBC 309 01/30/2022 0849   FERRITIN 239 (H) 01/30/2022 0849   IRONPCTSAT 56 (H) 01/30/2022 0849   Lipid Panel     Component Value Date/Time   CHOL 109 04/14/2023 0754   TRIG 80 04/14/2023 0754   HDL 41 04/14/2023 0754   CHOLHDL 2.5 08/09/2019 1047   LDLCALC 52 04/14/2023 0754   Hepatic Function Panel     Component Value Date/Time   PROT 7.0  04/14/2023 0754   ALBUMIN 4.4 04/14/2023 0754   AST 32 04/14/2023 0754   ALT 33 (H) 04/14/2023 0754   ALKPHOS 96 04/14/2023 0754   BILITOT 0.7 04/14/2023 0754   BILIDIR 0.13 09/13/2019 1117      Component Value Date/Time   TSH 1.760 04/14/2023 0754   Nutritional Lab Results  Component Value Date   VD25OH 78.3 04/14/2023   VD25OH 39.0 07/31/2022   VD25OH 51.7 08/23/2021     Assessment and Plan Assessment & Plan Obesity, class 2 and polyphagia Obesity, class 2, with a recent weight loss of three pounds in the last month. She is following a category three eating plan 80% of the time, tracking calories, and working on protein intake and hydration. Engaging in regular physical activity, including walking and farm work. Challenges include occasional meal skipping, insufficient sleep, and suboptimal fruit and vegetable intake. Insurance issues with prescription coverage for Zepbound  were resolved, allowing continued use for weight management. - Continue category three eating plan - Encourage tracking of calories and protein intake - Promote regular physical activity, including walking and farm work - Address meal skipping and improve sleep hygiene - Increase fruit and vegetable intake - Continue Zepbound  for weight management  Essential hypertension Essential hypertension, currently managed with valsartan  and amlodipine . Blood pressure today is 136/84 mmHg, slightly higher than her usual home readings of 109-110/68-70 mmHg, possibly due to allergy-related symptoms and headache. - Continue valsartan  and amlodipine  - Monitor blood pressure at home - Ensure adequate hydration - Report any symptoms of hypotension - Continue diet, exercise and weight loss as discussed today as an important part of the treatment plan   Prediabetes Prediabetes, managed with metformin  and Zepbound , which also aids in weight loss and glucose control. Prescription refill issues with [facility] were resolved,  and a change to a new specialty pharmacy is anticipated in October. - Continue metformin  and Zepbound  - Refill Zepbound  prescription - Continue diet, exercise and weight loss as discussed today as an important part of the treatment plan       Ruth Elliott was informed of the importance of frequent follow up visits to maximize her success with intensive lifestyle modifications for her obesity and obesity related health conditions as recommended by USPSTF and CMS guidelines   [name], MD

## 2023-10-06 ENCOUNTER — Ambulatory Visit: Admitting: Family

## 2023-10-06 ENCOUNTER — Encounter: Payer: Self-pay | Admitting: Family

## 2023-10-06 VITALS — BP 118/80 | HR 62 | Temp 98.6°F | Ht 66.0 in | Wt 209.6 lb

## 2023-10-06 DIAGNOSIS — M21621 Bunionette of right foot: Secondary | ICD-10-CM

## 2023-10-06 DIAGNOSIS — B070 Plantar wart: Secondary | ICD-10-CM | POA: Diagnosis not present

## 2023-10-06 DIAGNOSIS — Z23 Encounter for immunization: Secondary | ICD-10-CM | POA: Diagnosis not present

## 2023-10-06 NOTE — Progress Notes (Signed)
 Established Patient Office Visit  Subjective:      CC:  Chief Complaint  Patient presents with   Plantar Warts    HPI: Ruth Elliott is a 53 y.o. female presenting on 10/06/2023 for Plantar Warts .  Discussed the use of AI scribe software for clinical note transcription with the patient, who gave verbal consent to proceed.  History of Present Illness Ruth Elliott is a 53 year old female who presents with foot pain.  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 The pain occurs on the bottom of the foot when walking and on the side of the foot when putting on socks. It is not constant and varies with activity level and the type of socks worn.  She has a history of plantar warts and uses orthopedic inserts in her shoes. The patient reports that x-rays taken three to four years ago did not have any comments about the right side. The pain worsens with thin socks, especially during increased activity.  She has had issues with calluses and a pedicure that exacerbated the pain. There is a history of IT band issues in the same leg. Her footwear includes roomy shoes and leather padded barn boots, which accommodate her orthopedic inserts well.         Social history:  Relevant past medical, surgical, family and social history reviewed and updated as indicated. Interim medical history since our last visit reviewed.  Allergies and medications reviewed and updated.  DATA REVIEWED: CHART IN EPIC     ROS: Negative unless specifically indicated above in HPI.    Current Outpatient Medications:    amLODipine  (NORVASC ) 5 MG tablet, TAKE 1 TABLET(5 MG) BY MOUTH DAILY, Disp: 90 tablet, Rfl: 4   B Complex-C-Folic Acid (HM SUPER VITAMIN B COMPLEX/C PO), Take by mouth., Disp: , Rfl:    cetirizine (ZYRTEC) 10 MG chewable tablet, Chew 10 mg by mouth daily., Disp: , Rfl:     cholecalciferol (VITAMIN D3) 25 MCG (1000 UNIT) tablet, Take 1,000 Units by mouth daily., Disp: , Rfl:    Flax Oil-Fish Oil-Borage Oil (FISH OIL-FLAX OIL-BORAGE OIL) CAPS, Take 2 capsules by mouth daily. , Disp: , Rfl:    fluticasone  (FLONASE ) 50 MCG/ACT nasal spray, SHAKE LIQUID AND USE 2 SPRAYS IN EACH NOSTRIL DAILY, Disp: 16 g, Rfl: 1   ibuprofen (ADVIL) 400 MG tablet, Take 400 mg by mouth every 6 (six) hours as needed., Disp: , Rfl:    metFORMIN  (GLUCOPHAGE ) 500 MG tablet, Take 1 tablet (500 mg total) by mouth 2 (two) times daily with a meal., Disp: 180 tablet, Rfl: 0   Multiple Vitamin (MULTIVITAMIN) capsule, Take 1 capsule by mouth daily., Disp: , Rfl:    tirzepatide  (ZEPBOUND ) 5 MG/0.5ML Pen, Inject 5 mg into the skin once a week., Disp: 2 mL, Rfl: 0   triamcinolone  cream (KENALOG ) 0.1 %, Apply 1 Application topically 2 (two) times daily., Disp: 30 g, Rfl: 0   valsartan  (DIOVAN ) 80 MG tablet, Take 1 tablet (80 mg total) by mouth daily., Disp: 90 tablet, Rfl: 4        Objective:        BP 118/80 (BP Location: Left Arm, Patient Position: Sitting, Cuff Size: Large)   Pulse 62   Temp 98.6 F (37 C) (Temporal)   Ht 5' 6 (1.676 m)   Wt 209 lb 9.6 oz (95.1 kg)  LMP 01/20/2017   SpO2 98%   BMI 33.83 kg/m   Physical Exam EXTREMITIES: Bunion and callus formation on the right side of the foot.  Wt Readings from Last 3 Encounters:  10/06/23 209 lb 9.6 oz (95.1 kg)  09/29/23 205 lb (93 kg)  09/04/23 208 lb (94.3 kg)    Physical Exam Vitals reviewed.  Feet:     Comments: Right foot with erythematic bunion bony growth right lateral side  Plantar wart foot pad right 5th metacarpal  Callous formation right lateral base of 5th metatarsal           Results Procedure: Cryotherapy Description: Cryotherapy was applied to the plantar wart on the foot.  Assessment & Plan:   Assessment and Plan Assessment & Plan Bunionette, plantar wart, and callus of right  foot Intermittent pain in the right foot, exacerbated by increased walking activity. Pain is localized to the bottom and side of the foot, with a noticeable bony prominence on the right side, suggestive of a bunionette. Previous x-rays did not indicate this issue. The plantar wart is almost resolved, but there is significant callus formation. Pain is more pronounced when wearing thin socks or walking barefoot. Differential includes bunionette and plantar wart. Discussed potential surgical intervention for the bunionette if it continues to cause significant issues. - Refer to podiatry for further evaluation and management of foot issues, including potential callus shaving and assessment of bunionette. - Advise wearing cushioned socks and roomy shoes to alleviate pressure on the foot. - Freeze the plantar wart to aid in resolution. Cryotherapy performed in office   General Health Maintenance Received flu shot during the visit. Discussed the need for the second shingles vaccine, which is due.  Follow-Up Follow-up with podiatry for foot issues. Ensure referral is processed and appointment is scheduled. - If no contact from podiatry within one to two weeks, follow up on referral status.  Recording duration: 7 minutes      Return if symptoms worsen or fail to improve.   Ginger Patrick, MSN, APRN, FNP-C [facility]

## 2023-10-27 ENCOUNTER — Ambulatory Visit (INDEPENDENT_AMBULATORY_CARE_PROVIDER_SITE_OTHER): Admitting: Family Medicine

## 2023-10-27 ENCOUNTER — Encounter (INDEPENDENT_AMBULATORY_CARE_PROVIDER_SITE_OTHER): Payer: Self-pay | Admitting: Family Medicine

## 2023-10-27 VITALS — BP 138/88 | HR 75 | Temp 98.7°F | Ht 66.0 in | Wt 205.0 lb

## 2023-10-27 DIAGNOSIS — R7303 Prediabetes: Secondary | ICD-10-CM | POA: Diagnosis not present

## 2023-10-27 DIAGNOSIS — R632 Polyphagia: Secondary | ICD-10-CM

## 2023-10-27 DIAGNOSIS — Z6833 Body mass index (BMI) 33.0-33.9, adult: Secondary | ICD-10-CM

## 2023-10-27 DIAGNOSIS — E669 Obesity, unspecified: Secondary | ICD-10-CM | POA: Diagnosis not present

## 2023-10-27 DIAGNOSIS — Z6835 Body mass index (BMI) 35.0-35.9, adult: Secondary | ICD-10-CM

## 2023-10-27 DIAGNOSIS — I10 Essential (primary) hypertension: Secondary | ICD-10-CM | POA: Diagnosis not present

## 2023-10-27 MED ORDER — ZEPBOUND 5 MG/0.5ML ~~LOC~~ SOAJ: 5.0000 mg | SUBCUTANEOUS | 0 refills | Status: DC

## 2023-10-27 NOTE — Progress Notes (Signed)
 Office: [phone]  /  Fax: [phone]  WEIGHT SUMMARY AND BIOMETRICS  Anthropometric Measurements Height: 5' 6 (1.676 m) Weight: 205 lb (93 kg) BMI (Calculated): 33.1 Weight at Last Visit: 205lb Weight Lost Since Last Visit: 0lb Weight Gained Since Last Visit: 0lb Starting Weight: 223lb Total Weight Loss (lbs): 18 lb (8.165 kg) Peak Weight: 240lb   Body Composition  Body Fat %: 41.7 % Fat Mass (lbs): 85.8 lbs Muscle Mass (lbs): 113.8 lbs Total Body Water (lbs): 87.4 lbs Visceral Fat Rating : 11   Other Clinical Data Fasting: No Labs: No Today's Visit #: 58 Starting Date: 08/30/19    Chief Complaint: OBESITY   History of Present Illness Ruth Elliott is a 53 year old female who presents for obesity treatment and progress assessment.  She adheres to the category three eating plan approximately 80% of the time, focusing on increasing her intake of fruits, vegetables, and protein. She struggles with maintaining hydration and often skips meals, particularly on busy workdays, due to low hunger levels, affecting her ability to maintain a consistent eating schedule.  She engages in physical activity by walking for 30 minutes, five days a week. She is currently taking Zepbound  at 5 mg to aid in weight loss and requests a refill.  She has a history of hypertension and is currently on amlodipine  5 mg and valsartan  80 mg. She experiences a high level of work-related stress, often working long hours, which impacts her meal timing and quality. Her work environment is demanding, leading to irregular meal patterns, particularly on Tuesdays and Wednesdays when her workload is highest. She finds stress relief by running around a barn for three hours on Sunday mornings.  She is also on metformin  and has an adequate supply of this medication.      PHYSICAL EXAM:  Blood pressure 138/88, pulse 75, temperature 98.7 F (37.1 C), height 5' 6 (1.676 m), weight 205 lb (93 kg),  last menstrual period 01/20/2017, SpO2 98%. Body mass index is 33.09 kg/m.  DIAGNOSTIC DATA REVIEWED:  BMET    Component Value Date/Time   NA 141 04/14/2023 0754   K 4.8 04/14/2023 0754   CL 103 04/14/2023 0754   CO2 23 04/14/2023 0754   GLUCOSE 85 04/14/2023 0754   BUN 13 04/14/2023 0754   CREATININE 0.79 04/14/2023 0754   CALCIUM 9.7 04/14/2023 0754   GFRNONAA 105 02/14/2020 1204   GFRAA 121 02/14/2020 1204   Lab Results  Component Value Date   HGBA1C 5.4 04/14/2023   HGBA1C 5.4 08/31/2013   Lab Results  Component Value Date   INSULIN  14.7 04/14/2023   INSULIN  12.7 08/30/2019   Lab Results  Component Value Date   TSH 1.760 04/14/2023   CBC    Component Value Date/Time   WBC 9.2 01/30/2022 0849   RBC 4.29 01/30/2022 0849   HGB 14.1 01/30/2022 0849   HCT 42.0 01/30/2022 0849   PLT 180 01/30/2022 0849   MCV 98 (H) 01/30/2022 0849   MCH 32.9 01/30/2022 0849   MCHC 33.6 01/30/2022 0849   RDW 12.6 01/30/2022 0849   Iron Studies    Component Value Date/Time   IRON 172 (H) 01/30/2022 0849   TIBC 309 01/30/2022 0849   FERRITIN 239 (H) 01/30/2022 0849   IRONPCTSAT 56 (H) 01/30/2022 0849   Lipid Panel     Component Value Date/Time   CHOL 109 04/14/2023 0754   TRIG 80 04/14/2023 0754   HDL 41 04/14/2023 0754   CHOLHDL  2.5 08/09/2019 1047   LDLCALC 52 04/14/2023 0754   Hepatic Function Panel     Component Value Date/Time   PROT 7.0 04/14/2023 0754   ALBUMIN 4.4 04/14/2023 0754   AST 32 04/14/2023 0754   ALT 33 (H) 04/14/2023 0754   ALKPHOS 96 04/14/2023 0754   BILITOT 0.7 04/14/2023 0754   BILIDIR 0.13 09/13/2019 1117      Component Value Date/Time   TSH 1.760 04/14/2023 0754   Nutritional Lab Results  Component Value Date   VD25OH 78.3 04/14/2023   VD25OH 39.0 07/31/2022   VD25OH 51.7 08/23/2021     Assessment and Plan Assessment & Plan Obesity with polyphagia Obesity management is ongoing with adherence to the category three eating  plan about 80% of the time. She is incorporating more fruits, vegetables, and protein but struggles with hydration and meal regularity, particularly on busy workdays. Exercise includes walking 30 minutes five days a week. Weight has been maintained over the last month, with an increase in muscle mass and a decrease in fat by one pound. Currently on Zepbound  5 mg for weight loss and requests a refill. Hunger levels are low, contributing to meal skipping, especially during workdays. - Refill Zepbound  5 mg prescription and send to [facility]. - Encourage planning meals to ensure adequate nutrition and protein intake, aiming for over 30 grams of protein per meal. - Suggest incorporating microwavable dinners with adequate protein content for convenience. - Continue current exercise regimen of walking 30 minutes five days a week.  Essential hypertension Blood pressure is borderline at 138/88 mmHg. She is currently on amlodipine  5 mg and valsartan  80 mg. - Continue diet, exercise and weight loss as discussed today as an important part of the treatment plan   Prediabetes Prediabetes is being managed with lifestyle modifications and metformin . She is still on metformin  and has an adequate supply of the medication. - Continue diet, exercise and weight loss as discussed today as an important part of the treatment plan     Ruth Elliott was informed of the importance of frequent follow up visits to maximize her success with intensive lifestyle modifications for her obesity and obesity related health conditions as recommended by USPSTF and CMS guidelines   Ruth Penton, MD

## 2023-10-30 ENCOUNTER — Ambulatory Visit: Admitting: Podiatry

## 2023-10-30 DIAGNOSIS — Q666 Other congenital valgus deformities of feet: Secondary | ICD-10-CM | POA: Diagnosis not present

## 2023-10-30 DIAGNOSIS — Q828 Other specified congenital malformations of skin: Secondary | ICD-10-CM

## 2023-10-30 NOTE — Progress Notes (Signed)
 Subjective:  Patient ID: Ruth Elliott, female    DOB: 06-Jul-1970,  MRN: [id number]  Chief Complaint  Patient presents with   Toe Pain    53 y.o. female presents with the above complaint.  Patient presents with right submetatarsal 5 porokeratotic lesion painful to touch is progressing and worse worse with ambulation worse with pressure she would like to discuss treatment options for it.  She states she has been hurting for quite some time.  She has not seen MRIs prior to seeing me she does not wear any orthotics.  She does not wear very supportive shoes.   Review of Systems: Negative except as noted in the HPI. Denies N/V/F/Ch.  Past Medical History:  Diagnosis Date   Allergic rhinitis    Allergy    Arthritis    Back pain    Endometriosis    Followed by [facility]- on Nortrel   Joint pain    Obesity    Thyroid  disease    White coat syndrome with hypertension     Current Outpatient Medications:    amLODipine  (NORVASC ) 5 MG tablet, TAKE 1 TABLET(5 MG) BY MOUTH DAILY, Disp: 90 tablet, Rfl: 4   B Complex-C-Folic Acid (HM SUPER VITAMIN B COMPLEX/C PO), Take by mouth., Disp: , Rfl:    cetirizine (ZYRTEC) 10 MG chewable tablet, Chew 10 mg by mouth daily., Disp: , Rfl:    cholecalciferol (VITAMIN D3) 25 MCG (1000 UNIT) tablet, Take 1,000 Units by mouth daily., Disp: , Rfl:    Flax Oil-Fish Oil-Borage Oil (FISH OIL-FLAX OIL-BORAGE OIL) CAPS, Take 2 capsules by mouth daily. , Disp: , Rfl:    fluticasone  (FLONASE ) 50 MCG/ACT nasal spray, SHAKE LIQUID AND USE 2 SPRAYS IN EACH NOSTRIL DAILY, Disp: 16 g, Rfl: 1   ibuprofen (ADVIL) 400 MG tablet, Take 400 mg by mouth every 6 (six) hours as needed., Disp: , Rfl:    metFORMIN  (GLUCOPHAGE ) 500 MG tablet, Take 1 tablet (500 mg total) by mouth 2 (two) times daily with a meal., Disp: 180 tablet, Rfl: 0   Multiple Vitamin (MULTIVITAMIN) capsule, Take 1 capsule by mouth daily., Disp: , Rfl:    tirzepatide  (ZEPBOUND ) 5 MG/0.5ML Pen, Inject 5 mg  into the skin once a week., Disp: 2 mL, Rfl: 0   triamcinolone  cream (KENALOG ) 0.1 %, Apply 1 Application topically 2 (two) times daily., Disp: 30 g, Rfl: 0   valsartan  (DIOVAN ) 80 MG tablet, Take 1 tablet (80 mg total) by mouth daily., Disp: 90 tablet, Rfl: 4  Social History   Tobacco Use  Smoking Status Never  Smokeless Tobacco Never    Allergies  Allergen Reactions   Penicillins Shortness Of Breath    DID tolerate Omnicef  October 2024    Hydralazine      Elevated blood pressure, migraine, and lethary   Lisinopril      paresthesia   Singulair [Montelukast Sodium]     Moody   Objective:  There were no vitals filed for this visit. There is no height or weight on file to calculate BMI. Constitutional Well developed. Well nourished.  Vascular Dorsalis pedis pulses palpable bilaterally. Posterior tibial pulses palpable bilaterally. Capillary refill normal to all digits.  No cyanosis or clubbing noted. Pedal hair growth normal.  Neurologic Normal speech. Oriented to person, place, and time. Epicritic sensation to light touch grossly present bilaterally.  Dermatologic Nails well groomed and normal in appearance. No open wounds. No skin lesions.  Orthopedic: Right submetatarsal 5 plantarflexed metatarsal noted with mild tailor's  bunion.  Pes planovalgus foot structure noted with calcaneovalgus  Partially recreate the arch with dorsiflexion unable to perform single and double heel raise   Radiographs: None Assessment:   1. Porokeratosis   2. Pes planovalgus    Plan:  Patient was evaluated and treated and all questions answered.  Right submetatarsal 5 porokeratotic lesion - All questions and concerns were discussed with the patient in extensive detail patient will benefit from courtesy debridement of the right submetatarsal 5 lesion.  I discussed shoe gear modification extensive detail  Pes planovalgus/foot deformity -I explained to patient the etiology of pes planovalgus  and relationship with heel pain/arch pain and various treatment options were discussed.  Given patient foot structure in the setting of heel pain/arch pain I believe patient will benefit from custom-made orthotics to help control the hindfoot motion support the arch of the foot and take the stress away from arches.  Patient agrees with the plan like to proceed with orthotics -Patient was casted for orthotics with offloading of submetatarsal 5 bilateral   No follow-ups on file.

## 2023-11-12 NOTE — Progress Notes (Signed)
 Orthotics are on order

## 2023-11-17 ENCOUNTER — Ambulatory Visit (INDEPENDENT_AMBULATORY_CARE_PROVIDER_SITE_OTHER): Admitting: Family Medicine

## 2023-11-17 ENCOUNTER — Encounter (INDEPENDENT_AMBULATORY_CARE_PROVIDER_SITE_OTHER): Payer: Self-pay | Admitting: Family Medicine

## 2023-11-17 VITALS — BP 154/96 | HR 65 | Temp 98.2°F | Ht 66.0 in | Wt 203.0 lb

## 2023-11-17 DIAGNOSIS — E559 Vitamin D deficiency, unspecified: Secondary | ICD-10-CM | POA: Diagnosis not present

## 2023-11-17 DIAGNOSIS — R030 Elevated blood-pressure reading, without diagnosis of hypertension: Secondary | ICD-10-CM | POA: Diagnosis not present

## 2023-11-17 DIAGNOSIS — R7303 Prediabetes: Secondary | ICD-10-CM | POA: Diagnosis not present

## 2023-11-17 DIAGNOSIS — R632 Polyphagia: Secondary | ICD-10-CM | POA: Diagnosis not present

## 2023-11-17 DIAGNOSIS — E669 Obesity, unspecified: Secondary | ICD-10-CM

## 2023-11-17 DIAGNOSIS — Z6832 Body mass index (BMI) 32.0-32.9, adult: Secondary | ICD-10-CM

## 2023-11-17 MED ORDER — METFORMIN HCL 500 MG PO TABS: 500.0000 mg | ORAL_TABLET | Freq: Two times a day (BID) | ORAL | 0 refills | Status: DC

## 2023-11-17 MED ORDER — ZEPBOUND 5 MG/0.5ML ~~LOC~~ SOAJ: 5.0000 mg | SUBCUTANEOUS | 0 refills | Status: DC

## 2023-11-17 NOTE — Progress Notes (Signed)
 Office: [phone]  /  Fax: [phone]  WEIGHT SUMMARY AND BIOMETRICS  Anthropometric Measurements Height: 5' 6 (1.676 m) Weight: 203 lb (92.1 kg) BMI (Calculated): 32.78 Weight at Last Visit: 205 lb Weight Lost Since Last Visit: 2 lb Weight Gained Since Last Visit: 0 Starting Weight: 223 lb Total Weight Loss (lbs): 20 lb (9.072 kg) Peak Weight: 240 lb   Body Composition  Body Fat %: 41.6 % Fat Mass (lbs): 84.6 lbs Muscle Mass (lbs): 113 lbs Total Body Water (lbs): 86.6 lbs Visceral Fat Rating : 11   Other Clinical Data Fasting: yes Labs: no Today's Visit #: 60 Starting Date: 08/30/19    Chief Complaint: OBESITY   History of Present Illness Ruth Elliott is a 53 year old female who presents for assessment of her obesity treatment progress.  She has been following the prescribed category three eating plan approximately eighty percent of the time, resulting in a weight loss of two pounds over the past month. She is working on increasing her intake of fruits, vegetables, and protein but reports inadequate hydration and skipping meals. She is not achieving the recommended seven to nine hours of sleep per night. Her physical activity includes walking four days a week for thirty minutes each session.  Her blood pressure readings today are elevated. She reports that her blood pressure is usually within normal limits. She attributes this elevation to allergies, noting that she feels 'stuffy' and has not yet taken her allergy or blood pressure medications.  She has prediabetes and is currently managed with metformin  500 mg twice daily. Her most recent hemoglobin A1c, measured over six months ago, was well controlled at 5.4. She experiences polyphagia separate from her obesity and is being treated with Zepbound  5 mg, for which she requests a refill.  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 She  notes an improvement in her lower back pain, stating that her back no longer hurts upon waking, which she attributes to increased physical activity, including walking down a steep hill three times per shift.  She has a history of vitamin D  deficiency.      PHYSICAL EXAM:  Blood pressure (!) 154/96, pulse 65, temperature 98.2 F (36.8 C), height 5' 6 (1.676 m), weight 203 lb (92.1 kg), last menstrual period 01/20/2017, SpO2 98%. Body mass index is 32.77 kg/m.  DIAGNOSTIC DATA REVIEWED:  BMET    Component Value Date/Time   NA 141 04/14/2023 0754   K 4.8 04/14/2023 0754   CL 103 04/14/2023 0754   CO2 23 04/14/2023 0754   GLUCOSE 85 04/14/2023 0754   BUN 13 04/14/2023 0754   CREATININE 0.79 04/14/2023 0754   CALCIUM 9.7 04/14/2023 0754   GFRNONAA 105 02/14/2020 1204   GFRAA 121 02/14/2020 1204   Lab Results  Component Value Date   HGBA1C 5.4 04/14/2023   HGBA1C 5.4 08/31/2013   Lab Results  Component Value Date   INSULIN  14.7 04/14/2023   INSULIN  12.7 08/30/2019   Lab Results  Component Value Date   TSH 1.760 04/14/2023   CBC    Component Value Date/Time   WBC 9.2 01/30/2022 0849   RBC 4.29 01/30/2022 0849   HGB 14.1 01/30/2022 0849   HCT 42.0 01/30/2022 0849   PLT 180 01/30/2022 0849   MCV 98 (H) 01/30/2022 0849   MCH 32.9 01/30/2022 0849   MCHC 33.6 01/30/2022 0849   RDW 12.6 01/30/2022 0849  Iron Studies    Component Value Date/Time   IRON 172 (H) 01/30/2022 0849   TIBC 309 01/30/2022 0849   FERRITIN 239 (H) 01/30/2022 0849   IRONPCTSAT 56 (H) 01/30/2022 0849   Lipid Panel     Component Value Date/Time   CHOL 109 04/14/2023 0754   TRIG 80 04/14/2023 0754   HDL 41 04/14/2023 0754   CHOLHDL 2.5 08/09/2019 1047   LDLCALC 52 04/14/2023 0754   Hepatic Function Panel     Component Value Date/Time   PROT 7.0 04/14/2023 0754   ALBUMIN 4.4 04/14/2023 0754   AST 32 04/14/2023 0754   ALT 33 (H) 04/14/2023 0754   ALKPHOS 96 04/14/2023 0754    BILITOT 0.7 04/14/2023 0754   BILIDIR 0.13 09/13/2019 1117      Component Value Date/Time   TSH 1.760 04/14/2023 0754   Nutritional Lab Results  Component Value Date   VD25OH 78.3 04/14/2023   VD25OH 39.0 07/31/2022   VD25OH 51.7 08/23/2021     Assessment and Plan Assessment & Plan Obesity with polyphagia She has lost 2 pounds in the last month. Following the category three eating plan 80% of the time. Not hydrating adequately, skipping meals, and not getting adequate sleep. Walking 30 minutes, 5 days a week. Currently on Zepbound  5 mg for polyphagia. Insurance change to [employer] may affect medication coverage. Discussed potential for generic Saxenda  post-approval and insurance coverage options. Cash pay price for Zepbound  is $1200/month, with a coupon reducing it to $500/month if insurance covers 40%. - Continue Zepbound  5 mg. - Explore insurance coverage options for Zepbound  with [employer]. - Consider generic Saxenda  as an alternative if insurance does not cover Zepbound . - Encouraged adherence to the category three eating plan. - Encouraged adequate hydration, meal regularity, and sleep. - Encouraged continued physical activity.  Prediabetes Well-controlled hemoglobin A1c of 5.4 over six months ago. Currently on metformin  500 mg twice a day. Discussed potential to increase metformin  dosage if needed. Insurance change may affect medication coverage. - Continue metformin  500 mg twice a day. - Will consider increasing metformin  dosage if needed. - Will check labs for prediabetes.  White coat syndrome without HTN Blood pressure elevated at 147/85 and 154/96, unusual for her as it has traditionally been within normal limits. Discussed potential impact of allergies and weather on blood pressure. - Check BP at home 2-3 per week and recheck BP I office in 1 month  Vitamin D  deficiency Due for lab check. - Ordered labs to check vitamin D  levels.      [name] was counseled on the importance of maintaining healthy lifestyle habits, including balanced nutrition, regular physical activity, and behavioral modifications, while taking antiobesity medication.  Patient verbalized understanding that medication is an adjunct to, not a replacement for, lifestyle changes and that the long-term success and weight maintenance depend on continued adherence to these strategies.   Dawnielle was informed of the importance of frequent follow up visits to maximize her success with intensive lifestyle modifications for her obesity and obesity related health conditions as recommended by USPSTF and CMS guidelines   [name], MD

## 2023-11-24 ENCOUNTER — Ambulatory Visit (INDEPENDENT_AMBULATORY_CARE_PROVIDER_SITE_OTHER): Payer: Self-pay | Admitting: Family Medicine

## 2023-12-01 NOTE — Progress Notes (Unsigned)
 Cardiology Office Note  Date:  12/02/2023   ID:  Ruth Elliott, DOB 1970/06/22, MRN [id number]  PCP:  Corwin Antu, FNP   Chief Complaint  Patient presents with   12 month follow up     Doing well.     HPI:  Ruth Elliott is a 53 year old woman with past medical history of Nodular goiter Recent thyroid  surgery with Dr. 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  Cholesterol numbers well-controlled on no medication non-smoker  History of allergy issues, followed by ENT Allergies worse this fall  Lab work reviewed A1c 5.4,  Total cholesterol 109 LDL 52 Normal CMP   on low-dose Zepbound   EKG personally reviewed by myself on todays visit EKG Interpretation Date/Time:  Tuesday December 02 2023 14:24:16 EST Ventricular Rate:  72 PR Interval:  164 QRS Duration:  88 QT Interval:  408 QTC Calculation: 446 R Axis:   36  Text Interpretation: Normal sinus rhythm Nonspecific T wave abnormality When compared with ECG of 23-Oct-2022 13:44, No significant change was found Confirmed by Perla Lye [phone]) on 12/02/2023 2:27:07 PM   T wave abnormality seen previously on EKGs since 2017  Of past medical history reviewed BP elevated prior to and following thyroid  surgery 170 systolic and higher Given Hydralazine  IV, 3 doses post op by anesthesia. Down to 150 systolic with standing at the time of discharge No significant surgical pain reported At 7 PM, patient at home, systolic pressure back up to 170 systolic   She was started on hydralazine  50 mg TID PRN for systolic pressure >150 Side effects on the hydralazine ,  had headache  CT scan reviewed showing no carotid atherosclerosis, no aortic arch atherosclerosis  Cholesterol very low on no medication, non-smoker, nondiabetic  PMH:   has a past medical history of Allergic rhinitis, Allergy, Arthritis, Back pain, Endometriosis, Joint pain, Obesity, Thyroid  disease, and White coat syndrome with hypertension.  PSH:    Past Surgical History:  Procedure Laterality Date   APPENDECTOMY     NASAL SINUS SURGERY     OVARIAN CYST SURGERY     THYROIDECTOMY Left 11/17/2018   Procedure: THYROIDECTOMY-Hemi Thyroidectomy;  Surgeon: Herminio Miu, MD;  Location: [facility] ORS;  Service: ENT;  Laterality: Left;   TONSILLECTOMY  1994    Current Outpatient Medications  Medication Sig Dispense Refill   B Complex-C-Folic Acid (HM SUPER VITAMIN B COMPLEX/C PO) Take by mouth.     cetirizine (ZYRTEC) 10 MG chewable tablet Chew 10 mg by mouth daily.     cholecalciferol (VITAMIN D3) 25 MCG (1000 UNIT) tablet Take 1,000 Units by mouth daily.     Flax Oil-Fish Oil-Borage Oil (FISH OIL-FLAX OIL-BORAGE OIL) CAPS Take 2 capsules by mouth daily.      fluticasone  (FLONASE ) 50 MCG/ACT nasal spray SHAKE LIQUID AND USE 2 SPRAYS IN EACH NOSTRIL DAILY 16 g 1   ibuprofen (ADVIL) 400 MG tablet Take 400 mg by mouth every 6 (six) hours as needed.     metFORMIN  (GLUCOPHAGE ) 500 MG tablet Take 1 tablet (500 mg total) by mouth 2 (two) times daily with a meal. 180 tablet 0  Multiple Vitamin (MULTIVITAMIN) capsule Take 1 capsule by mouth daily.     tirzepatide  (ZEPBOUND ) 5 MG/0.5ML Pen Inject 5 mg into the skin once a week. 2 mL 0   amLODipine  (NORVASC ) 5 MG tablet TAKE 1 TABLET(5 MG) BY MOUTH DAILY 90 tablet 4   triamcinolone  cream (KENALOG ) 0.1 % Apply 1 Application topically 2 (two) times daily. (Patient not taking: Reported on 12/02/2023) 30 g 0   valsartan  (DIOVAN ) 80 MG tablet Take 1 tablet (80 mg total) by mouth daily. 90 tablet 4   No current facility-administered medications for this  visit.    Allergies:   Penicillins, Hydralazine , Lisinopril , and Singulair [montelukast sodium]   Social History:  The patient  reports that she has never smoked. She has never used smokeless tobacco. She reports current alcohol use. She reports that she does not use drugs.   Family History:   family history includes Alzheimer's disease in her father; Arthritis in her father; COPD in her mother; Diabetes in her mother; Heart disease in her mother; Hypertension in her father and mother.    Review of Systems: Review of Systems  Constitutional: Negative.   HENT: Negative.    Respiratory: Negative.    Cardiovascular: Negative.   Gastrointestinal: Negative.   Musculoskeletal: Negative.   Neurological: Negative.   Psychiatric/Behavioral: Negative.    All other systems reviewed and are negative.   PHYSICAL EXAM: VS:  BP (!) 140/82 (BP Location: Left Arm, Patient Position: Sitting, Cuff Size: Normal)   Pulse 72   Ht 5' 6 (1.676 m)   Wt 209 lb (94.8 kg)   LMP 01/20/2017   SpO2 99%   BMI 33.73 kg/m  , BMI Body mass index is 33.73 kg/m. Constitutional:  oriented to person, place, and time. No distress.  HENT:  Head: Normocephalic and atraumatic.  Eyes:  no discharge. No scleral icterus.  Neck: Normal range of motion. Neck supple. No JVD present.  Cardiovascular: Normal rate, regular rhythm, normal heart sounds and intact distal pulses. Exam reveals no gallop and no friction rub. No edema No murmur heard. Pulmonary/Chest: Effort normal and breath sounds normal. No stridor. No respiratory distress.  no wheezes.  no rales.  no tenderness.  Abdominal: Soft.  no distension.  no tenderness.  Musculoskeletal: Normal range of motion.  no  tenderness or deformity.  Neurological:  normal muscle tone. Coordination normal. No atrophy Skin: Skin is warm and dry. No rash noted. not diaphoretic.  Psychiatric:  normal mood and affect. behavior is normal. Thought content normal.    Recent  Labs: 04/14/2023: ALT 33; BUN 13; Creatinine, Ser 0.79; Potassium 4.8; Sodium 141; TSH 1.760    Lipid Panel Lab Results  Component Value Date   CHOL 109 04/14/2023   HDL 41 04/14/2023   LDLCALC 52 04/14/2023   TRIG 80 04/14/2023    Wt Readings from Last 3 Encounters:  12/02/23 209 lb (94.8 kg)  11/17/23 203 lb (92.1 kg)  10/27/23 205 lb (93 kg)     ASSESSMENT AND PLAN:  Problem List Items Addressed This Visit       Cardiology Problems   Essential hypertension - Primary   Relevant Medications   valsartan  (DIOVAN ) 80 MG tablet   amLODipine  (NORVASC ) 5 MG tablet   Other Relevant Orders   EKG 12-Lead (Completed)     Other   Pre-diabetes    Hypertension -Given trace lower extremity swelling recommend she hold the amlodipine  and restart valsartan  -If blood pressure runs high we could use  higher dose valsartan , could even retry amlodipine  at reduced dose 2.5 On Zepbound   Cardiac risk factors Non-smoker, diabetes numbers well controlled,  Cholesterol numbers well-controlled on no medication  Goiter Prior resection of cyst Followed by endocrine and ENT TSH within normal range  Prediabetes Numbers improved A1c less than 6 On metformin  and Zepbound  Getting more exercise especially on the weekends   Signed, Velinda Lunger, M.D., Ph.D. [facility] [place], [place] [phone]

## 2023-12-02 ENCOUNTER — Encounter: Payer: Self-pay | Admitting: Cardiovascular Disease

## 2023-12-02 ENCOUNTER — Ambulatory Visit: Attending: Cardiovascular Disease | Admitting: Cardiovascular Disease

## 2023-12-02 VITALS — BP 140/82 | HR 72 | Ht 66.0 in | Wt 209.0 lb

## 2023-12-02 DIAGNOSIS — I10 Essential (primary) hypertension: Secondary | ICD-10-CM

## 2023-12-02 DIAGNOSIS — R7303 Prediabetes: Secondary | ICD-10-CM | POA: Diagnosis not present

## 2023-12-02 MED ORDER — VALSARTAN 80 MG PO TABS: 80.0000 mg | ORAL_TABLET | Freq: Every day | ORAL | 4 refills | Status: AC

## 2023-12-02 MED ORDER — AMLODIPINE BESYLATE 5 MG PO TABS: ORAL_TABLET | ORAL | 4 refills | Status: DC

## 2023-12-02 NOTE — Addendum Note (Signed)
 Addended by: PERLA EVALENE PARAS on: 12/02/2023 02:43 PM   Modules accepted: Level of Service

## 2023-12-02 NOTE — Patient Instructions (Signed)

## 2023-12-16 LAB — CBC WITH DIFFERENTIAL/PLATELET
Basophils Absolute: 0 x10E3/uL (ref 0.0–0.2)
Basos: 1 %
EOS (ABSOLUTE): 0.1 x10E3/uL (ref 0.0–0.4)
Eos: 1 %
Hematocrit: 45.8 % (ref 34.0–46.6)
Hemoglobin: 15 g/dL (ref 11.1–15.9)
Immature Grans (Abs): 0 x10E3/uL (ref 0.0–0.1)
Immature Granulocytes: 0 %
Lymphocytes Absolute: 2.5 x10E3/uL (ref 0.7–3.1)
Lymphs: 30 %
MCH: 32.2 pg (ref 26.6–33.0)
MCHC: 32.8 g/dL (ref 31.5–35.7)
MCV: 98 fL — ABNORMAL HIGH (ref 79–97)
Monocytes Absolute: 0.7 x10E3/uL (ref 0.1–0.9)
Monocytes: 8 %
Neutrophils Absolute: 5 x10E3/uL (ref 1.4–7.0)
Neutrophils: 59 %
Platelets: 169 x10E3/uL (ref 150–450)
RBC: 4.66 x10E6/uL (ref 3.77–5.28)
RDW: 12.1 % (ref 11.7–15.4)
WBC: 8.4 x10E3/uL (ref 3.4–10.8)

## 2023-12-16 LAB — CMP14+EGFR
ALT: 21 IU/L (ref 0–32)
AST: 30 IU/L (ref 0–40)
Albumin: 4.4 g/dL (ref 3.8–4.9)
Alkaline Phosphatase: 96 IU/L (ref 49–135)
BUN/Creatinine Ratio: 25 — ABNORMAL HIGH (ref 9–23)
BUN: 16 mg/dL (ref 6–24)
Bilirubin Total: 1 mg/dL (ref 0.0–1.2)
CO2: 22 mmol/L (ref 20–29)
Calcium: 9.2 mg/dL (ref 8.7–10.2)
Chloride: 102 mmol/L (ref 96–106)
Creatinine, Ser: 0.64 mg/dL (ref 0.57–1.00)
Globulin, Total: 2.6 g/dL (ref 1.5–4.5)
Glucose: 84 mg/dL (ref 70–99)
Potassium: 4.9 mmol/L (ref 3.5–5.2)
Sodium: 139 mmol/L (ref 134–144)
Total Protein: 7 g/dL (ref 6.0–8.5)
eGFR: 105 mL/min/1.73 (ref >59)

## 2023-12-16 LAB — LIPID PANEL WITH LDL/HDL RATIO
Cholesterol, Total: 108 mg/dL (ref 100–199)
HDL: 43 mg/dL (ref >39)
LDL Chol Calc (NIH): 51 mg/dL (ref 0–99)
LDL/HDL Ratio: 1.2 ratio (ref 0.0–3.2)
Triglycerides: 62 mg/dL (ref 0–149)
VLDL Cholesterol Cal: 14 mg/dL (ref 5–40)

## 2023-12-16 LAB — INSULIN, RANDOM: INSULIN: 8.7 u[IU]/mL (ref 2.6–24.9)

## 2023-12-16 LAB — VITAMIN D 25 HYDROXY (VIT D DEFICIENCY, FRACTURES): Vit D, 25-Hydroxy: 45.4 ng/mL (ref 30.0–100.0)

## 2023-12-16 LAB — VITAMIN B12: Vitamin B-12: 1000 pg/mL (ref 232–1245)

## 2023-12-16 LAB — HEMOGLOBIN A1C
Est. average glucose Bld gHb Est-mCnc: 100 mg/dL
Hgb A1c MFr Bld: 5.1 % (ref 4.8–5.6)

## 2023-12-18 ENCOUNTER — Ambulatory Visit (INDEPENDENT_AMBULATORY_CARE_PROVIDER_SITE_OTHER): Admitting: Podiatry

## 2023-12-18 DIAGNOSIS — Q828 Other specified congenital malformations of skin: Secondary | ICD-10-CM

## 2023-12-18 DIAGNOSIS — Q666 Other congenital valgus deformities of feet: Secondary | ICD-10-CM

## 2023-12-18 NOTE — Progress Notes (Signed)
 Orthotics are dispensed they are functioning well no acute complaints.  If any foot and ankle issues arise future she will come back and see me.  Break-in period was discussed

## 2023-12-22 ENCOUNTER — Ambulatory Visit (INDEPENDENT_AMBULATORY_CARE_PROVIDER_SITE_OTHER): Payer: Self-pay | Admitting: Family Medicine

## 2023-12-24 ENCOUNTER — Encounter (INDEPENDENT_AMBULATORY_CARE_PROVIDER_SITE_OTHER): Payer: Self-pay | Admitting: Family Medicine

## 2023-12-24 ENCOUNTER — Ambulatory Visit (INDEPENDENT_AMBULATORY_CARE_PROVIDER_SITE_OTHER): Admitting: Family Medicine

## 2023-12-24 VITALS — BP 124/79 | HR 76 | Temp 98.5°F | Ht 66.0 in | Wt 203.0 lb

## 2023-12-24 DIAGNOSIS — E559 Vitamin D deficiency, unspecified: Secondary | ICD-10-CM

## 2023-12-24 DIAGNOSIS — E669 Obesity, unspecified: Secondary | ICD-10-CM | POA: Diagnosis not present

## 2023-12-24 DIAGNOSIS — R632 Polyphagia: Secondary | ICD-10-CM | POA: Diagnosis not present

## 2023-12-24 DIAGNOSIS — R7303 Prediabetes: Secondary | ICD-10-CM

## 2023-12-24 DIAGNOSIS — Z6832 Body mass index (BMI) 32.0-32.9, adult: Secondary | ICD-10-CM | POA: Diagnosis not present

## 2023-12-24 MED ORDER — ZEPBOUND 5 MG/0.5ML ~~LOC~~ SOAJ: 5.0000 mg | SUBCUTANEOUS | 0 refills | Status: DC

## 2023-12-24 MED ORDER — METFORMIN HCL 500 MG PO TABS: 500.0000 mg | ORAL_TABLET | Freq: Two times a day (BID) | ORAL | 0 refills | Status: DC

## 2023-12-24 NOTE — Progress Notes (Signed)
 Office: [phone]  /  Fax: [phone]  WEIGHT SUMMARY AND BIOMETRICS  Anthropometric Measurements Height: 5' 6 (1.676 m) Weight: 203 lb (92.1 kg) BMI (Calculated): 32.78 Weight at Last Visit: 203 lb Weight Lost Since Last Visit: 0 Weight Gained Since Last Visit: 0 Starting Weight: 223 lb Total Weight Loss (lbs): 20 lb (9.072 kg) Peak Weight: 240 lb   Body Composition  Body Fat %: 40.8 % Fat Mass (lbs): 83 lbs Muscle Mass (lbs): 114.4 lbs Total Body Water (lbs): 86 lbs Visceral Fat Rating : 10   Other Clinical Data Fasting: no Labs: no Today's Visit #: 93 Starting Date: 08/30/19    Chief Complaint: OBESITY     History of Present Illness Ruth Elliott is a 53 year old female with obesity and prediabetes who presents for a follow-up on her obesity treatment and progress.  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 She is working on increasing her intake of whole foods and protein but struggles with hydration, often skips meals, and does not consistently achieve seven to nine hours of sleep per night. Her weight has remained stable over the past month, including during Thanksgiving.  She is being treated for prediabetes with metformin  500 mg twice daily. Her recent lab results show an A1c of 5.1, down from 5.4, and her insulin  level has decreased from 14.7 to 8.7. She requests a refill for metformin .  She is on Zepbound  5 mg for polyphagia associated with obesity and requests a refill. Her hunger is well controlled with the current dose, although it sometimes leads to skipping meals, particularly lunch. She incorporates protein snacks like cheese sticks and Greek yogurt into her diet.  She has a history of migraines and notes that her neurologist recently left, leaving her overdue for Botox treatment. She also had a past issue with blood  pressure medication, experiencing a rash from amlodipine , which resolved after switching to valsartan .  Her social history includes limited social interactions, which she is content with, and she plans to visit her brother in [place]  for Christmas. She works night shifts and plans to travel after her shift on Christmas morning.      PHYSICAL EXAM:  Blood pressure 124/79, pulse 76, temperature 98.5 F (36.9 C), height 5' 6 (1.676 m), weight 203 lb (92.1 kg), last menstrual period 01/20/2017, SpO2 97%. Body mass index is 32.77 kg/m.  DIAGNOSTIC DATA REVIEWED:  BMET    Component Value Date/Time   NA 139 12/15/2023 0802   K 4.9 12/15/2023 0802   CL 102 12/15/2023 0802   CO2 22 12/15/2023 0802   GLUCOSE 84 12/15/2023 0802   BUN 16 12/15/2023 0802   CREATININE 0.64 12/15/2023 0802   CALCIUM 9.2 12/15/2023 0802   GFRNONAA 105 02/14/2020 1204   GFRAA 121 02/14/2020 1204   Lab Results  Component Value Date   HGBA1C 5.1 12/15/2023   HGBA1C 5.4 08/31/2013   Lab Results  Component Value Date   INSULIN  8.7 12/15/2023   INSULIN  12.7 08/30/2019   Lab Results  Component Value Date   TSH 1.760 04/14/2023   CBC    Component Value Date/Time   WBC 8.4 12/15/2023 0802   RBC 4.66 12/15/2023 0802   HGB 15.0 12/15/2023 0802   HCT 45.8 12/15/2023 0802   PLT 169 12/15/2023 0802   MCV 98 (H) 12/15/2023 0802  MCH 32.2 12/15/2023 0802   MCHC 32.8 12/15/2023 0802   RDW 12.1 12/15/2023 0802   Iron Studies    Component Value Date/Time   IRON 172 (H) 01/30/2022 0849   TIBC 309 01/30/2022 0849   FERRITIN 239 (H) 01/30/2022 0849   IRONPCTSAT 56 (H) 01/30/2022 0849   Lipid Panel     Component Value Date/Time   CHOL 108 12/15/2023 0802   TRIG 62 12/15/2023 0802   HDL 43 12/15/2023 0802   CHOLHDL 2.5 08/09/2019 1047   LDLCALC 51 12/15/2023 0802   Hepatic Function Panel     Component Value Date/Time   PROT 7.0 12/15/2023 0802   ALBUMIN 4.4 12/15/2023 0802   AST 30 12/15/2023  0802   ALT 21 12/15/2023 0802   ALKPHOS 96 12/15/2023 0802   BILITOT 1.0 12/15/2023 0802   BILIDIR 0.13 09/13/2019 1117      Component Value Date/Time   TSH 1.760 04/14/2023 0754   Nutritional Lab Results  Component Value Date   VD25OH 45.4 12/15/2023   VD25OH 78.3 04/14/2023   VD25OH 39.0 07/31/2022     Assessment and Plan Assessment & Plan Obesity Management is ongoing with a focus on diet, exercise, and medication. She follows the category three eating plan 80% of the time, engages in physical activity such as walking and barn work, and is working on increasing whole foods and protein intake. However, she is not hydrating adequately, skipping meals, and not getting sufficient sleep. Despite these challenges, she has maintained her weight over the last month and has lost 1.5 pounds of fat, with an increase in muscle mass. The GLP-1 medication (Zepbound ) is helping with hunger control, but there is a risk of inadequate nutrient intake due to meal skipping. - Continue category three eating plan. - Encouraged adequate hydration and regular meal intake. - Continue physical activity, including walking and barn work. - Refilled Zepbound  prescription.  Prediabetes Managed with metformin  500 mg twice daily, along with lifestyle modifications including diet and exercise. Recent lab results show improvement with A1c reduced from 5.4 to 5.1 and insulin  levels decreased from 14.7 to 8.7, indicating effective management. - Continue metformin  500 mg twice daily. - Refilled metformin  prescription. - Continue lifestyle modifications for prediabetes management.  Polyphagia associated with obesity Polyphagia is being managed with Zepbound , which is effectively controlling hunger. However, there is a tendency to skip meals, particularly lunch, which may affect overall nutrient intake. - Continue Zepbound  for polyphagia management. - Encouraged regular meal intake to ensure adequate  nutrition.  Vitamin D  deficiency Vitamin D  levels have decreased from 78 to 45, which is below the desired range of 50-60. She has been taking 5000 IU of vitamin D  twice a week, but this regimen may need adjustment to improve levels, especially as winter approaches. - Adjusted vitamin D  supplementation to 5000 IU three times a week.      Patients who are on anti-obesity medications are counseled on the importance of maintaining healthy lifestyle habits, including balanced nutrition, regular physical activity, and behavioral modifications,  Medication is an adjunct to, not a replacement for, lifestyle changes and that the long-term success and weight maintenance depend on continued adherence to these strategies.   Terris was informed of the importance of frequent follow up visits to maximize her success with intensive lifestyle modifications for her obesity and obesity related health conditions as recommended by USPSTF and CMS guidelines   [name], MD

## 2024-01-19 ENCOUNTER — Encounter (INDEPENDENT_AMBULATORY_CARE_PROVIDER_SITE_OTHER): Payer: Self-pay | Admitting: Family Medicine

## 2024-01-19 ENCOUNTER — Ambulatory Visit (INDEPENDENT_AMBULATORY_CARE_PROVIDER_SITE_OTHER): Admitting: Family Medicine

## 2024-01-19 VITALS — BP 136/87 | HR 71 | Temp 98.1°F | Ht 66.0 in | Wt 206.0 lb

## 2024-01-19 DIAGNOSIS — R632 Polyphagia: Secondary | ICD-10-CM | POA: Diagnosis not present

## 2024-01-19 DIAGNOSIS — E669 Obesity, unspecified: Secondary | ICD-10-CM | POA: Diagnosis not present

## 2024-01-19 DIAGNOSIS — R7303 Prediabetes: Secondary | ICD-10-CM

## 2024-01-19 DIAGNOSIS — Z6833 Body mass index (BMI) 33.0-33.9, adult: Secondary | ICD-10-CM

## 2024-01-19 MED ORDER — ZEPBOUND 5 MG/0.5ML ~~LOC~~ SOAJ: 5.0000 mg | SUBCUTANEOUS | 0 refills | Status: DC

## 2024-01-19 MED ORDER — METFORMIN HCL 500 MG PO TABS: 500.0000 mg | ORAL_TABLET | Freq: Two times a day (BID) | ORAL | 0 refills | Status: DC

## 2024-01-19 NOTE — Progress Notes (Signed)
 "  Office: [phone]  /  Fax: [phone]  WEIGHT SUMMARY AND BIOMETRICS  Anthropometric Measurements Height: 5' 6 (1.676 m) Weight: 206 lb (93.4 kg) BMI (Calculated): 33.27 Weight at Last Visit: 203 lb Weight Lost Since Last Visit: 0 Weight Gained Since Last Visit: 3 lb Starting Weight: 223 lb Total Weight Loss (lbs): 17 lb (7.711 kg) Peak Weight: 240 lb   Body Composition  Body Fat %: 41.1 % Fat Mass (lbs): 84.8 lbs Muscle Mass (lbs): 115.2 lbs Total Body Water (lbs): 85.2 lbs Visceral Fat Rating : 11   Other Clinical Data Fasting: no Labs: n Today's Visit #: 58 Starting Date: 08/30/19    Chief Complaint: OBESITY    History of Present Illness Ruth Elliott is a 54 year old female with obesity and prediabetes who presents for obesity treatment and progress assessment.  She adheres to a category three eating plan approximately eighty percent of the time, struggles with consuming the recommended amount of protein, but has increased her intake of fruits and vegetables. She occasionally skips meals, does not drink enough water, and reports inadequate sleep.  She engages in physical activity by walking for exercise five days a week for thirty minutes each session. Despite these efforts, she has gained three pounds over the last month, which she attributes to the holiday season.  She is currently on metformin  500 mg twice a day for prediabetes and Zepbound  5 mg weekly as part of her obesity treatment. She requests refills for both medications.  She has been meal prepping high-protein breakfast casseroles using ground chicken, cottage cheese, milk, and eggs to ensure she meets her protein needs. She also consumes milk, chicken breast, Greek yogurt, and cheese sticks to increase her protein intake.  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 She has a new financial controller with [employer] and is  uncertain about coverage details.  No sleep apnea, history of heart attack, or stroke. Mentions a consistent abnormality on EKG that has not changed over the years.      PHYSICAL EXAM:  Blood pressure 136/87, pulse 71, temperature 98.1 F (36.7 C), height 5' 6 (1.676 m), weight 206 lb (93.4 kg), last menstrual period 01/20/2017, SpO2 98%. Body mass index is 33.25 kg/m.  DIAGNOSTIC DATA REVIEWED:  BMET    Component Value Date/Time   NA 139 12/15/2023 0802   K 4.9 12/15/2023 0802   CL 102 12/15/2023 0802   CO2 22 12/15/2023 0802   GLUCOSE 84 12/15/2023 0802   BUN 16 12/15/2023 0802   CREATININE 0.64 12/15/2023 0802   CALCIUM 9.2 12/15/2023 0802   GFRNONAA 105 02/14/2020 1204   GFRAA 121 02/14/2020 1204   Lab Results  Component Value Date   HGBA1C 5.1 12/15/2023   HGBA1C 5.4 08/31/2013   Lab Results  Component Value Date   INSULIN  8.7 12/15/2023   INSULIN  12.7 08/30/2019   Lab Results  Component Value Date   TSH 1.760 04/14/2023   CBC    Component Value Date/Time   WBC 8.4 12/15/2023 0802   RBC 4.66 12/15/2023 0802   HGB 15.0 12/15/2023 0802   HCT 45.8 12/15/2023 0802   PLT 169 12/15/2023 0802   MCV 98 (H) 12/15/2023 0802   MCH 32.2 12/15/2023 0802   MCHC 32.8 12/15/2023 0802   RDW 12.1 12/15/2023 0802   Iron Studies    Component Value Date/Time   IRON 172 (H) 01/30/2022 0849  TIBC 309 01/30/2022 0849   FERRITIN 239 (H) 01/30/2022 0849   IRONPCTSAT 56 (H) 01/30/2022 0849   Lipid Panel     Component Value Date/Time   CHOL 108 12/15/2023 0802   TRIG 62 12/15/2023 0802   HDL 43 12/15/2023 0802   CHOLHDL 2.5 08/09/2019 1047   LDLCALC 51 12/15/2023 0802   Hepatic Function Panel     Component Value Date/Time   PROT 7.0 12/15/2023 0802   ALBUMIN 4.4 12/15/2023 0802   AST 30 12/15/2023 0802   ALT 21 12/15/2023 0802   ALKPHOS 96 12/15/2023 0802   BILITOT 1.0 12/15/2023 0802   BILIDIR 0.13 09/13/2019 1117      Component Value Date/Time   TSH  1.760 04/14/2023 0754   Nutritional Lab Results  Component Value Date   VD25OH 45.4 12/15/2023   VD25OH 78.3 04/14/2023   VD25OH 39.0 07/31/2022     Assessment and Plan Assessment & Plan Obesity Management is ongoing with a focus on dietary changes and exercise. She follows the category three eating plan 80% of the time, struggles with protein intake, and has gained three pounds over the last month. She engages in walking for exercise five days a week for thirty minutes. Meal prepping is being utilized to increase protein intake. Insurance coverage for obesity medications is being addressed, with a focus on hypertension and prediabetes as potential comorbidities to support coverage. - Continue category three eating plan with emphasis on increasing protein intake. - Continue walking for exercise five days a week for thirty minutes. - Address insurance coverage for obesity medications, focusing on hypertension and prediabetes as comorbidities. - Sent prescriptions to [facility] and initiated prior authorization process.  Prediabetes Managed with metformin  500 mg twice a day. She is also working on diet, exercise, and weight loss to manage this condition. - Continue metformin  500 mg twice a day. - Continue dietary and exercise regimen to manage prediabetes.  Polyphagia Managed with Zepbound  5 mg weekly. She is also working on diet, exercise, and weight loss to manage this condition. - Continue Zepbound  5 mg weekly. - Continue dietary and exercise regimen to manage polyphagia.      Patients who are on anti-obesity medications are counseled on the importance of maintaining healthy lifestyle habits, including balanced nutrition, regular physical activity, and behavioral modifications,  Medication is an adjunct to, not a replacement for, lifestyle changes and that the long-term success and weight maintenance depend on continued adherence to these strategies.   Jacy was informed of the  importance of frequent follow up visits to maximize her success with intensive lifestyle modifications for her obesity and obesity related health conditions as recommended by USPSTF and CMS guidelines   [name], MD   "

## 2024-01-20 ENCOUNTER — Telehealth (INDEPENDENT_AMBULATORY_CARE_PROVIDER_SITE_OTHER): Payer: Self-pay

## 2024-01-20 NOTE — Telephone Encounter (Signed)
 Prior auth submitted for Zepbound  5 mg. Awaiting determination.

## 2024-01-21 NOTE — Telephone Encounter (Signed)
 Message from Plan This medication or product was previously approved on [id number] from 2023-08-21 to 2024-08-27. **Please note: This request was submitted electronically. Formulary lowering, tiering exception, cost reduction and/or pre-benefit determination review (including prospective Medicare hospice reviews) requests cannot be requested using this method of submission. Providers contact us  at [phone] for further assistance.  Patient notified via my chart.

## 2024-01-27 ENCOUNTER — Telehealth (INDEPENDENT_AMBULATORY_CARE_PROVIDER_SITE_OTHER): Payer: Self-pay | Admitting: Family Medicine

## 2024-01-27 ENCOUNTER — Encounter (INDEPENDENT_AMBULATORY_CARE_PROVIDER_SITE_OTHER): Payer: Self-pay | Admitting: Family Medicine

## 2024-01-27 NOTE — Telephone Encounter (Signed)
 Pt called and the pharmacy is stating the zepbound  is not covered by her insurance and pt wanted to make sure that it was filed thru [employer] as that is the new insurance and it may need authorization thru them. Pt states she was told it was approved thru August but did change insurances in January.

## 2024-02-03 NOTE — Telephone Encounter (Signed)
 Call to the pharmacy and spoke with the tech.  Medication is not being covered because patient has to enroll in the weight watchers program.  Call to patient and she states that she is already enrolled in the program.  Encourage patient to reach out to the weight watchers tomorrow and verbally speak to someone and send a my chart message to let us  know what the issue is and what steps need to happen next.  Patient verbalized understanding.  No further needs, call ended.

## 2024-02-12 NOTE — Telephone Encounter (Signed)
 Call to patient to see if she was able to get her Zepbound .  She was able to pick up the medication on 02/05/24 after she contacted weight watchers and re-enrolled in the program.  No further needs, call ended.

## 2024-02-16 ENCOUNTER — Encounter (INDEPENDENT_AMBULATORY_CARE_PROVIDER_SITE_OTHER): Payer: Self-pay | Admitting: Family Medicine

## 2024-02-16 ENCOUNTER — Telehealth (INDEPENDENT_AMBULATORY_CARE_PROVIDER_SITE_OTHER): Admitting: Family Medicine

## 2024-02-16 VITALS — Ht 66.0 in | Wt 206.0 lb

## 2024-02-16 DIAGNOSIS — R7303 Prediabetes: Secondary | ICD-10-CM

## 2024-02-16 DIAGNOSIS — Z6833 Body mass index (BMI) 33.0-33.9, adult: Secondary | ICD-10-CM | POA: Diagnosis not present

## 2024-02-16 DIAGNOSIS — E669 Obesity, unspecified: Secondary | ICD-10-CM | POA: Diagnosis not present

## 2024-02-16 DIAGNOSIS — R632 Polyphagia: Secondary | ICD-10-CM | POA: Diagnosis not present

## 2024-02-16 MED ORDER — ZEPBOUND 5 MG/0.5ML ~~LOC~~ SOAJ: 5.0000 mg | SUBCUTANEOUS | 0 refills | Status: AC

## 2024-02-16 MED ORDER — METFORMIN HCL 500 MG PO TABS: 500.0000 mg | ORAL_TABLET | Freq: Two times a day (BID) | ORAL | 0 refills | Status: AC

## 2024-03-15 ENCOUNTER — Ambulatory Visit (INDEPENDENT_AMBULATORY_CARE_PROVIDER_SITE_OTHER): Admitting: Family Medicine

## 2024-06-01 ENCOUNTER — Ambulatory Visit: Admitting: Dermatology

## 2024-06-10 ENCOUNTER — Ambulatory Visit: Admitting: Dermatology
